# Patient Record
Sex: Female | Born: 1985 | Hispanic: Yes | Marital: Single | Smoking: Never smoker
Health system: Southern US, Community
[De-identification: ages and names within clinical notes are randomized; demographics above are authoritative.]

## PROBLEM LIST (undated history)

## (undated) ENCOUNTER — Emergency Department (HOSPITAL_COMMUNITY): Admission: EM | Payer: Self-pay | Source: Home / Self Care

## (undated) DIAGNOSIS — Z862 Personal history of diseases of the blood and blood-forming organs and certain disorders involving the immune mechanism: Secondary | ICD-10-CM

## (undated) DIAGNOSIS — R7401 Elevation of levels of liver transaminase levels: Secondary | ICD-10-CM

## (undated) DIAGNOSIS — R002 Palpitations: Secondary | ICD-10-CM

## (undated) DIAGNOSIS — E119 Type 2 diabetes mellitus without complications: Secondary | ICD-10-CM

## (undated) DIAGNOSIS — I10 Essential (primary) hypertension: Secondary | ICD-10-CM

## (undated) DIAGNOSIS — J45909 Unspecified asthma, uncomplicated: Secondary | ICD-10-CM

## (undated) DIAGNOSIS — F419 Anxiety disorder, unspecified: Secondary | ICD-10-CM

## (undated) DIAGNOSIS — L309 Dermatitis, unspecified: Secondary | ICD-10-CM

## (undated) HISTORY — DX: Personal history of diseases of the blood and blood-forming organs and certain disorders involving the immune mechanism: Z86.2

## (undated) HISTORY — DX: Essential (primary) hypertension: I10

## (undated) HISTORY — DX: Palpitations: R00.2

## (undated) HISTORY — DX: Elevation of levels of liver transaminase levels: R74.01

## (undated) HISTORY — PX: TUBAL LIGATION: SHX77

---

## 2016-04-17 ENCOUNTER — Encounter (HOSPITAL_COMMUNITY): Payer: Self-pay

## 2016-04-17 ENCOUNTER — Emergency Department (HOSPITAL_COMMUNITY)
Admission: EM | Admit: 2016-04-17 | Discharge: 2016-04-17 | Disposition: A | Discharge status: Home / Self Care | Payer: Self-pay | Attending: Emergency Medicine | Admitting: Emergency Medicine

## 2016-04-17 DIAGNOSIS — J45909 Unspecified asthma, uncomplicated: Secondary | ICD-10-CM | POA: Insufficient documentation

## 2016-04-17 DIAGNOSIS — K602 Anal fissure, unspecified: Secondary | ICD-10-CM | POA: Insufficient documentation

## 2016-04-17 HISTORY — DX: Unspecified asthma, uncomplicated: J45.909

## 2016-04-17 HISTORY — DX: Anxiety disorder, unspecified: F41.9

## 2016-04-17 HISTORY — DX: Dermatitis, unspecified: L30.9

## 2016-04-17 LAB — COMPREHENSIVE METABOLIC PANEL
ALBUMIN: 4.3 g/dL (ref 3.5–5.0)
ALT: 172 U/L — ABNORMAL HIGH (ref 14–54)
ANION GAP: 10 (ref 5–15)
AST: 142 U/L — ABNORMAL HIGH (ref 15–41)
Alkaline Phosphatase: 67 U/L (ref 38–126)
BUN: 14 mg/dL (ref 6–20)
CO2: 21 mmol/L — AB (ref 22–32)
Calcium: 9.7 mg/dL (ref 8.9–10.3)
Chloride: 105 mmol/L (ref 101–111)
Creatinine, Ser: 0.68 mg/dL (ref 0.44–1.00)
GFR calc Af Amer: >60 mL/min (ref >60)
GFR calc non Af Amer: >60 mL/min (ref >60)
GLUCOSE: 113 mg/dL — AB (ref 65–99)
POTASSIUM: 3.7 mmol/L (ref 3.5–5.1)
SODIUM: 136 mmol/L (ref 135–145)
Total Bilirubin: 1 mg/dL (ref 0.3–1.2)
Total Protein: 8 g/dL (ref 6.5–8.1)

## 2016-04-17 LAB — URINALYSIS, ROUTINE W REFLEX MICROSCOPIC
BILIRUBIN URINE: NEGATIVE
Glucose, UA: NEGATIVE mg/dL
Ketones, ur: NEGATIVE mg/dL
Leukocytes, UA: NEGATIVE
NITRITE: NEGATIVE
PROTEIN: NEGATIVE mg/dL
SPECIFIC GRAVITY, URINE: 1.018 (ref 1.005–1.030)
pH: 7 (ref 5.0–8.0)

## 2016-04-17 LAB — URINE MICROSCOPIC-ADD ON
BACTERIA UA: NONE SEEN
RBC / HPF: NONE SEEN RBC/hpf (ref 0–5)
WBC, UA: NONE SEEN WBC/hpf (ref 0–5)

## 2016-04-17 LAB — CBC
HEMATOCRIT: 40.6 % (ref 36.0–46.0)
HEMOGLOBIN: 14.3 g/dL (ref 12.0–15.0)
MCH: 36.8 pg — AB (ref 26.0–34.0)
MCHC: 35.2 g/dL (ref 30.0–36.0)
MCV: 104.4 fL — ABNORMAL HIGH (ref 78.0–100.0)
Platelets: 283 10*3/uL (ref 150–400)
RBC: 3.89 MIL/uL (ref 3.87–5.11)
RDW: 12.9 % (ref 11.5–15.5)
WBC: 8.8 10*3/uL (ref 4.0–10.5)

## 2016-04-17 LAB — TYPE AND SCREEN
ABO/RH(D): O POS
ANTIBODY SCREEN: NEGATIVE

## 2016-04-17 LAB — HCG, QUANTITATIVE, PREGNANCY

## 2016-04-17 LAB — ABO/RH: ABO/RH(D): O POS

## 2016-04-17 LAB — LIPASE, BLOOD: LIPASE: 22 U/L (ref 11–51)

## 2016-04-17 NOTE — ED Triage Notes (Signed)
PT C/O RECTAL BLEEDING X5 DAYS. PT STS SHE DID HAVE HARD STOOLS FOR THE FIRST FEW DAYS, BUT NO HX OF HEMORRHOIDS. PT ALSO STS NAUSEA, ABDOMINAL BLOATING, AND LOWER BACK PRESSURE. DENIES INJURY.

## 2016-04-17 NOTE — ED Provider Notes (Signed)
[facility]-EMERGENCY DEPT Provider Note   CSN: [id number] Arrival date & time: 04/17/16  1652     History   Chief Complaint Chief Complaint  Patient presents with  . Rectal Bleeding    HPI Sara Simpson is a 30 y.o. female.  She presents for evaluation of rectal bleeding which started a couple of days ago and is persistent. She notices blood in, comode, and when she wipes. She denies rectal pain but has some lower abdominal bloating. She was constipated about a week ago. No prior similar problem. She denies fever, chills, nausea, vomiting, weakness or dizziness. There are no other known modifying factors.  HPI  Past Medical History:  Diagnosis Date  . Anxiety   . Asthma   . Eczema     There are no active problems to display for this patient.   History reviewed. No pertinent surgical history.  OB History    No data available       Home Medications    Prior to Admission medications   Not on File    Family History History reviewed. No pertinent family history.  Social History Social History  Substance Use Topics  . Smoking status: Never Smoker  . Smokeless tobacco: Never Used  . Alcohol use Yes     Comment: TWICE A WEEK     Allergies   Review of patient's allergies indicates no known allergies.   Review of Systems Review of Systems  All other systems reviewed and are negative.    Physical Exam Updated Vital Signs BP 166/87 (BP Location: Right Arm)   Pulse 113   Temp 98.8 F (37.1 C) (Oral)   Resp 18   Ht 5\' 4"  (1.626 m)   Wt 234 lb (106.1 kg)   LMP 03/23/2016   SpO2 97%   BMI 40.17 kg/m   Physical Exam  Constitutional: She is oriented to person, place, and time. She appears well-developed and well-nourished. No distress.  HENT:  Head: Normocephalic and atraumatic.  Right Ear: External ear normal.  Left Ear: External ear normal.  Nose: Nose normal.  Eyes: Conjunctivae and EOM are normal. Pupils are equal, round, and reactive to light.    Neck: Normal range of motion and phonation normal. Neck supple.  Cardiovascular: Normal rate and regular rhythm.   Pulmonary/Chest: Effort normal and breath sounds normal. She exhibits no tenderness.  Abdominal: Soft. She exhibits no distension. There is no tenderness. There is no guarding.  Genitourinary:  Genitourinary Comments: Anus- mild redundant tissue consistent with prior hemorrhoids. Small posterior anal fissure, actively bleeding. Digital rectal examination was deferred.  Musculoskeletal: Normal range of motion.  Neurological: She is alert and oriented to person, place, and time. She exhibits normal muscle tone.  Skin: Skin is warm and dry.  Psychiatric: She has a normal mood and affect. Her behavior is normal. Judgment and thought content normal.  Nursing note and vitals reviewed.    ED Treatments / Results  Labs (all labs ordered are listed, but only abnormal results are displayed) Labs Reviewed  COMPREHENSIVE METABOLIC PANEL - Abnormal; Notable for the following:       Result Value   CO2 21 (*)    Glucose, Bld 113 (*)    AST 142 (*)    ALT 172 (*)    All other components within normal limits  CBC - Abnormal; Notable for the following:    MCV 104.4 (*)    MCH 36.8 (*)    All other components within normal limits  URINALYSIS, ROUTINE W REFLEX MICROSCOPIC (NOT AT [facility]) - Abnormal; Notable for the following:    Hgb urine dipstick TRACE (*)    All other components within normal limits  URINE MICROSCOPIC-ADD ON - Abnormal; Notable for the following:    Squamous Epithelial / LPF 6-30 (*)    All other components within normal limits  LIPASE, BLOOD  HCG, QUANTITATIVE, PREGNANCY  TYPE AND SCREEN  ABO/RH    EKG  EKG Interpretation None       Radiology No results found.  Procedures Procedures (including critical care time)  Medications Ordered in ED Medications - No data to display   Initial Impression / Assessment and Plan / ED Course  I have reviewed  the triage vital signs and the nursing notes.  Pertinent labs & imaging results that were available during my care of the patient were reviewed by me and considered in my medical decision making (see chart for details).  Clinical Course  Value Comment By Time  Glucose: (!) 113 Slightly high Mancel Bale, MD 10/24 1843  CO2: (!) 21 Slightly low Mancel Bale, MD 10/24 1843  Hgb urine dipstick: (!) TRACE Minimally elevated, clean catch urine Mancel Bale, MD 10/24 1843  Squamous Epithelial / LPF: (!) 6-30 Indicates contamination Mancel Bale, MD 10/24 1843  WBC: 8.8 Normal Mancel Bale, MD 10/24 1844  Hemoglobin: 14.3 Normal, reassuring in the face of rectal bleeding Mancel Bale, MD 10/24 1844  HCG, Beta Chain, Quant, S: <1 Not pregnant Mancel Bale, MD 10/24 1844  Pulse Rate: 113 High Mancel Bale, MD 10/24 1844  BP: 166/87 High Mancel Bale, MD 10/24 1844    Medications - No data to display  Patient Vitals for the past 24 hrs:  BP Temp Temp src Pulse Resp SpO2 Height Weight  04/17/16 1701 - - - - - - 5\' 4"  (1.626 m) 234 lb (106.1 kg)  04/17/16 1657 166/87 98.8 F (37.1 C) Oral 113 18 97 % - -    7:10 PM Reevaluation with update and discussion. After initial assessment and treatment, an updated evaluation reveals No change in clinical status. Findings discussed with patient and all questions were answered. Shanora Christensen L    Final Clinical Impressions(s) / ED Diagnoses   Final diagnoses:  Anal fissure   Rectal bleeding secondary to anal fissure. Doubt significant blood loss, or impending vascular collapse.  Nursing Notes Reviewed/ Care Coordinated Applicable Imaging Reviewed Interpretation of Laboratory Data incorporated into ED treatment  The patient appears reasonably screened and/or stabilized for discharge and I doubt any other medical condition or other [facility] requiring further screening, evaluation, or treatment in the ED at this time prior to discharge.  Plan:  Home Medications- continue usual, Colace prn; Home Treatments- warm soaks, clean with moist wipe, avoid constipation; return here if the recommended treatment, does not improve the symptoms; Recommended follow up- PCP prn   New Prescriptions New Prescriptions   No medications on file     Mancel Bale, MD 04/17/16 [phone]

## 2016-04-17 NOTE — Discharge Instructions (Signed)
The anal fissure is unlikely to cause any long-term problems.  To encourage healing, soak in a tub once or twice a day. After bowel movements, clean well with a product such as a diaper wipe, to keep the area clean. You can try using a moisturizing cream on the area as well. If your stool tending to be hard, increase the fiber in your diet, avoid constipating foods, or try Colace 100 mg twice a day.  See the doctor of your choice as needed for problems.

## 2016-07-29 ENCOUNTER — Encounter (HOSPITAL_COMMUNITY): Payer: Self-pay | Admitting: *Deleted

## 2016-07-29 ENCOUNTER — Emergency Department (HOSPITAL_COMMUNITY)
Admission: EM | Admit: 2016-07-29 | Discharge: 2016-07-29 | Disposition: A | Discharge status: Home / Self Care | Payer: Self-pay | Attending: Emergency Medicine | Admitting: Emergency Medicine

## 2016-07-29 DIAGNOSIS — J45909 Unspecified asthma, uncomplicated: Secondary | ICD-10-CM | POA: Insufficient documentation

## 2016-07-29 DIAGNOSIS — L03818 Cellulitis of other sites: Secondary | ICD-10-CM

## 2016-07-29 DIAGNOSIS — H6012 Cellulitis of left external ear: Secondary | ICD-10-CM | POA: Insufficient documentation

## 2016-07-29 MED ORDER — DOXYCYCLINE HYCLATE 100 MG PO TABS
100.0000 mg | ORAL_TABLET | Freq: Once | ORAL | Status: AC
  Administered 2016-07-29: 100 mg via ORAL
  Filled 2016-07-29: qty 1

## 2016-07-29 MED ORDER — DOXYCYCLINE HYCLATE 100 MG PO CAPS: 100.0000 mg | ORAL_CAPSULE | Freq: Two times a day (BID) | ORAL | 0 refills | Status: AC

## 2016-07-29 NOTE — Discharge Instructions (Signed)
Please take doxycycline twice a day for 7 days. Use warm compress to the area several times a day. Keep area clean and dry. Make sure to frequently wash her hands throughout the day. Do not use guages until symptoms have been resolved.   Get help right away if: Your symptoms get worse. You feel very sleepy. You develop vomiting or diarrhea that persists. You notice red streaks coming from the infected area. Your red area gets larger or turns dark in color.

## 2016-07-29 NOTE — ED Provider Notes (Signed)
[facility]-EMERGENCY DEPT Provider Note   CSN: [id number] Arrival date & time: 07/29/16  1813  By signing my name below, I, Clovis PuAvnee Patel, attest that this documentation has been prepared under the direction and in the presence of  Francisco Espina- PA-C. Electronically Signed: Clovis PuAvnee Patel, ED Scribe. 07/29/16. 6:43 PM.  History   Chief Complaint Chief Complaint  Patient presents with  . Facial Swelling   The history is provided by the patient. No language interpreter was used.   HPI Comments:  Sara Simpson is a 31 y.o. female who presents to the Emergency Department complaining of worsening left ear swelling with surrounding associated pain onset yesterday. She describes her pain as a "8/10 and sharp" to the touch and a "5/10 and dull" at rest. Her pain is worse upon palpation. Pt states she wears guages x 1 year and takes them out daily. Pt also notes she is in contact with people at work and also goes to a gym where she may have been exposed to staph. She has taken ibuprofen and Claritin with no relief. Pt denies any fevers, chills, nausea, vomiting, diarrhea, abdominal pain, chest pain, SOB or recent cold-like symptoms. No hx of MRSA. No other complaints noted. No PCP. Last menstrual period was about 1 month ago and pt denies a chance of being pregnant.   Past Medical History:  Diagnosis Date  . Anxiety   . Asthma   . Eczema     There are no active problems to display for this patient.   History reviewed. No pertinent surgical history.  OB History    No data available       Home Medications    Prior to Admission medications   Medication Sig Start Date End Date Taking? Authorizing Provider  doxycycline (VIBRAMYCIN) 100 MG capsule Take 1 capsule (100 mg total) by mouth 2 (two) times daily. 07/29/16 08/05/16  Alvina ChouFrancisco Manuel Espina, [place]    Family History No family history on file.  Social History Social History  Substance Use Topics  . Smoking status: Never Smoker  .  Smokeless tobacco: Never Used  . Alcohol use Yes     Comment: TWICE A WEEK     Allergies   Patient has no known allergies.   Review of Systems Review of Systems  Constitutional: Negative for chills and fever.  HENT: Positive for ear pain and facial swelling. Negative for congestion.   Respiratory: Negative for shortness of breath.   Cardiovascular: Negative for chest pain.  Gastrointestinal: Negative for abdominal pain, diarrhea, nausea and vomiting.     Physical Exam Updated Vital Signs BP 143/85   Pulse 107   Temp 97.9 F (36.6 C) (Oral)   Resp 16   SpO2 94%   Physical Exam  Constitutional: She is oriented to person, place, and time. She appears well-developed and well-nourished. No distress.  Well appearing.   HENT:  Head: Normocephalic and atraumatic.  Left Ear: There is swelling.  Redness, warmth, swelling, and TTP to left ear and pinna. Patient has possible source of infection at center of pinna where her guages usually are placed. Scant blood and discharge noted.   Eyes: EOM are normal. Right eye exhibits no discharge. Left eye exhibits no discharge.  Neck: Normal range of motion.  Pulmonary/Chest: Effort normal. No respiratory distress.  Neurological: She is alert and oriented to person, place, and time. Coordination normal.  Skin: No rash noted. She is not diaphoretic.  Psychiatric: She has a normal mood and affect. Her  behavior is normal.  Nursing note and vitals reviewed.    ED Treatments / Results  DIAGNOSTIC STUDIES:  Oxygen Saturation is 94% on RA, normal by my interpretation.    COORDINATION OF CARE:  6:40 PM Discussed treatment plan with pt at bedside and pt agreed to plan.  Labs (all labs ordered are listed, but only abnormal results are displayed) Labs Reviewed - No data to display  EKG  EKG Interpretation None       Radiology No results found.  Procedures Procedures (including critical care time)  Medications Ordered in  ED Medications  doxycycline (VIBRA-TABS) tablet 100 mg (100 mg Oral Given 07/29/16 1904)     Initial Impression / Assessment and Plan / ED Course  I have reviewed the triage vital signs and the nursing notes.  Pertinent labs & imaging results that were available during my care of the patient were reviewed by me and considered in my medical decision making (see chart for details).    Pt is without gross abscess for which I&D would be possible.  Pt encouraged to return if redness begins to streak, extends beyond where it currently is, and/or fever or nausea/vomiting develop.  Pt is alert, oriented, NAD, afebrile, mildly tachycardic but, nonseptic and nontoxic appearing.  Pt to be d/c on oral antibiotics with strict f/u instructions to follow up with a PCP in 2-5 days. Return precautions given.    Final Clinical Impressions(s) / ED Diagnoses   Final diagnoses:  Cellulitis of other specified site    New Prescriptions Discharge Medication List as of 07/29/2016  6:49 PM    START taking these medications   Details  doxycycline (VIBRAMYCIN) 100 MG capsule Take 1 capsule (100 mg total) by mouth 2 (two) times daily., Starting Sun 07/29/2016, Until Sun 08/05/2016, Print      I personally performed the services described in this documentation, which was scribed in my presence. The recorded information has been reviewed and is accurate.    [address] [place], [place] 07/29/16 2001    Maia Plan, MD 07/30/16 1130

## 2016-07-29 NOTE — ED Triage Notes (Signed)
Pt states yesterday she felt some pain in her ear so she took the gauges out of her ear. This morning woke up with left ear swelling and warmth. No fevers.

## 2016-07-30 ENCOUNTER — Emergency Department (HOSPITAL_BASED_OUTPATIENT_CLINIC_OR_DEPARTMENT_OTHER)
Admission: EM | Admit: 2016-07-30 | Discharge: 2016-07-30 | Disposition: A | Discharge status: Home / Self Care | Payer: Self-pay | Attending: Dermatology | Admitting: Dermatology

## 2016-07-30 ENCOUNTER — Encounter (HOSPITAL_BASED_OUTPATIENT_CLINIC_OR_DEPARTMENT_OTHER): Payer: Self-pay | Admitting: *Deleted

## 2016-07-30 DIAGNOSIS — J45909 Unspecified asthma, uncomplicated: Secondary | ICD-10-CM | POA: Insufficient documentation

## 2016-07-30 DIAGNOSIS — Z79899 Other long term (current) drug therapy: Secondary | ICD-10-CM | POA: Insufficient documentation

## 2016-07-30 DIAGNOSIS — H9202 Otalgia, left ear: Secondary | ICD-10-CM | POA: Insufficient documentation

## 2016-07-30 DIAGNOSIS — Z5321 Procedure and treatment not carried out due to patient leaving prior to being seen by health care provider: Secondary | ICD-10-CM | POA: Insufficient documentation

## 2016-07-30 NOTE — ED Notes (Signed)
Called for reevaluation, no response

## 2016-07-30 NOTE — ED Notes (Signed)
Pt called x 3 , not found in lobby or bistro

## 2016-07-30 NOTE — ED Triage Notes (Signed)
Pt c/o left ear pain and swelling ? Abscess , [facility] , taking ABX

## 2018-02-27 ENCOUNTER — Other Ambulatory Visit: Payer: Self-pay

## 2019-01-23 ENCOUNTER — Emergency Department (HOSPITAL_COMMUNITY)
Admission: EM | Admit: 2019-01-23 | Discharge: 2019-01-23 | Disposition: A | Discharge status: Home / Self Care | Payer: Self-pay | Attending: Emergency Medicine | Admitting: Emergency Medicine

## 2019-01-23 ENCOUNTER — Other Ambulatory Visit: Payer: Self-pay

## 2019-01-23 ENCOUNTER — Encounter (HOSPITAL_COMMUNITY): Payer: Self-pay | Admitting: Emergency Medicine

## 2019-01-23 DIAGNOSIS — R000 Tachycardia, unspecified: Secondary | ICD-10-CM | POA: Insufficient documentation

## 2019-01-23 DIAGNOSIS — Z5321 Procedure and treatment not carried out due to patient leaving prior to being seen by health care provider: Secondary | ICD-10-CM | POA: Insufficient documentation

## 2019-01-23 NOTE — ED Notes (Signed)
Updated patient on wait for treatment room, as department is full and no available beds at this time.

## 2019-01-23 NOTE — ED Triage Notes (Signed)
Pt states that around 2am drank a few seltzers and having her heart racing. Reports when sitting still around 105-107- then when walking around it goes up to 110s. Reports she can't sleep. Denies pains.

## 2019-01-23 NOTE — ED Notes (Signed)
Pt states that her HR came down (97 on heart monitor in triage room) and she wants to go home and will follow up her with PCP.

## 2019-01-26 ENCOUNTER — Ambulatory Visit (INDEPENDENT_AMBULATORY_CARE_PROVIDER_SITE_OTHER): Payer: Self-pay | Admitting: Physician Assistant

## 2019-01-26 ENCOUNTER — Other Ambulatory Visit: Payer: Self-pay

## 2019-01-26 ENCOUNTER — Telehealth: Payer: Self-pay | Admitting: Physician Assistant

## 2019-01-26 ENCOUNTER — Encounter: Payer: Self-pay | Admitting: Physician Assistant

## 2019-01-26 VITALS — BP 133/81 | HR 86 | Temp 98.1°F | Ht 65.0 in | Wt 261.0 lb

## 2019-01-26 DIAGNOSIS — R002 Palpitations: Secondary | ICD-10-CM

## 2019-01-26 DIAGNOSIS — Z8249 Family history of ischemic heart disease and other diseases of the circulatory system: Secondary | ICD-10-CM

## 2019-01-26 DIAGNOSIS — R740 Nonspecific elevation of levels of transaminase and lactic acid dehydrogenase [LDH]: Secondary | ICD-10-CM

## 2019-01-26 DIAGNOSIS — R7401 Elevation of levels of liver transaminase levels: Secondary | ICD-10-CM

## 2019-01-26 DIAGNOSIS — I493 Ventricular premature depolarization: Secondary | ICD-10-CM

## 2019-01-26 DIAGNOSIS — Z1389 Encounter for screening for other disorder: Secondary | ICD-10-CM

## 2019-01-26 DIAGNOSIS — Z13220 Encounter for screening for lipoid disorders: Secondary | ICD-10-CM

## 2019-01-26 DIAGNOSIS — Z8349 Family history of other endocrine, nutritional and metabolic diseases: Secondary | ICD-10-CM

## 2019-01-26 DIAGNOSIS — I10 Essential (primary) hypertension: Secondary | ICD-10-CM

## 2019-01-26 DIAGNOSIS — R7303 Prediabetes: Secondary | ICD-10-CM

## 2019-01-26 LAB — POCT GLYCOSYLATED HEMOGLOBIN (HGB A1C): HbA1c, POC (prediabetic range): 6 % (ref 5.7–6.4)

## 2019-01-26 NOTE — Progress Notes (Signed)
HPI:                                                                Sara Simpson is a 33 y.o. female who presents to [place]: [facility] today to establish care  Current concerns: screen for diabetes, borderline high blood pressure and tachycardia  Patient reports recurrent episodes of "tachycardia" described as "heart racing." She states she wears an Apple watch and resting HR is typically in the 70's-80's. On Friday night after drinking several malt beverages she noticed that her resting heart rate was elevated at 108-112 and with walking HR would increase to 120-140. Associated with feeling lightheaded and "loopy." No associated chest discomfort, dyspnea, nausea, diaphoresis, or syncope. States she went to the ER for the symptoms, but left without being seen due to long wait time. Symptoms resolved spontaneously on Saturday. Episodes occur infrequently every several months ongoing for years. She is wondering if they could be due to anxiety. Reports that she took Sertraline in the past for anxiety, but is not currently taking any medications.  She also reports she has been told her blood pressure is borderline high.  Endorses drinking multiple caffeinated beverages daily, but she is trying to cut back. Denies illicit drug use. Drinks socially approx 4 drinks per week. Family hx significant for thyroid disease in sister, HTN and heart attack in MGF.  She also reports a strong family hx of diabetes (mother, maternal aunts, maternal grandparents) and would like to be screened. Denies polydipsia, polyuria, polyphagia.   Depression screen [facility] 2/9 01/26/2019  Decreased Interest 0  Down, Depressed, Hopeless 0  PHQ - 2 Score 0  Altered sleeping 2  Tired, decreased energy 2  Change in appetite 0  Feeling bad or failure about yourself  0  Trouble concentrating 0  Moving slowly or fidgety/restless 0  Suicidal thoughts 0  PHQ-9 Score 4    GAD 7 :  Generalized Anxiety Score 01/26/2019  Nervous, Anxious, on Edge 1  Control/stop worrying 1  Worry too much - different things 1  Trouble relaxing 1  Restless 0  Easily annoyed or irritable 1  Afraid - awful might happen 0  Total GAD 7 Score 5      Past Medical History:  Diagnosis Date  . Anxiety   . Asthma   . Eczema    No past surgical history on file. Social History   Tobacco Use  . Smoking status: Never Smoker  . Smokeless tobacco: Never Used  Substance Use Topics  . Alcohol use: Yes    Comment: TWICE A WEEK   family history is not on file.    ROS: negative except as noted in the HPI  Medications: No current outpatient medications on file.   No current facility-administered medications for this visit.    No Known Allergies     Objective:  BP 133/81   Pulse 86   Temp 98.1 F (36.7 C) (Oral)   Ht 5\' 5"  (1.651 m)   Wt 261 lb (118.4 kg)   LMP 01/26/2019 (Exact Date)   BMI 43.43 kg/m   Wt Readings from Last 3 Encounters:  01/26/19 261 lb (118.4 kg)  07/30/16 236 lb (107 kg)  04/17/16 234 lb (106.1 kg)  Temp Readings from Last 3 Encounters:  01/26/19 98.1 F (36.7 C) (Oral)  07/30/16 99.1 F (37.3 C) (Oral)  07/29/16 97.9 F (36.6 C) (Oral)   BP Readings from Last 3 Encounters:  01/26/19 133/81  07/30/16 125/88  07/29/16 143/85   Pulse Readings from Last 3 Encounters:  01/26/19 86  07/30/16 101  07/29/16 107    Gen:  alert, not ill-appearing, no distress, appropriate for age, obese female HEENT: head normocephalic without obvious abnormality, conjunctiva and cornea clear, trachea midline Pulm: Normal work of breathing, normal phonation, clear to auscultation bilaterally, no wheezes, rales or rhonchi CV: Normal rate, regular rhythm, s1 and s2 distinct, no murmurs, clicks or rubs  Neuro: alert and oriented x 3, no tremor MSK: extremities atraumatic, normal gait and station, no peripheral edema Skin: intact, no rashes on exposed skin, no  jaundice, no cyanosis Psych: well-groomed, cooperative, good eye contact, euthymic mood, affect mood-congruent, speech is articulate, and thought processes clear and goal-directed  ECG 01/26/19 16:03 Vent rate 83 bpm PR-I 136 ms QRS 86 ms QT/QTc 412/484 ms Sinus rhythm with premature supraventricular complexes and premature ventricular complexes or fusion complexes Abnormal ECG  Assessment and Plan: 33 y.o. female with  .Sara Simpson was seen today for establish care.  Diagnoses and all orders for this visit:  Intermittent palpitations -     CBC -     COMPLETE METABOLIC PANEL WITH GFR -     TSH + free T4 -     Lipid Panel w/reflex Direct LDL -     EKG 12-Lead  VPC's (ventricular premature complexes)  Hypertension goal BP (blood pressure) < 130/80 -     COMPLETE METABOLIC PANEL WITH GFR -     TSH + free T4 -     Urinalysis, Routine w reflex microscopic  Prediabetes -     Amb ref to Medical Nutrition Therapy-MNT -     POCT HgB A1C  Transaminitis -     Hepatic function panel -     Hepatitis panel, acute -     US Abdomen Limited RUQ; Future -     AntiMicrosomal Ab-Liver / Kidney -     Anti-smooth muscle antibody, IgG -     ANA  Screening for lipid disorders -     Lipid Panel w/reflex Direct LDL  Family history of thyroid disease -     TSH + free T4  Family history of heart disease -     Lipid Panel w/reflex Direct LDL  Screening for blood or protein in urine -     Urinalysis, Routine w reflex microscopic   - Personally reviewed PMH, PSH, PFH, medications, allergies, HM - Age-appropriate cancer screening: overdue for Pap smear, will schedule - Tdap declined - PHQ2 negative  Palpitations ECG personally reviewed by me and Dr. Clementeen GrahamEvan Corey. Normal QT. Normal axis. 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 Romone Shaff PA-C

## 2019-01-26 NOTE — Patient Instructions (Addendum)
For your blood pressure: - Goal <130/80 (Ideally 120's/70's) - monitor and log blood pressure and heart rate at home - check around the same time each day in a relaxed setting - reduce caffeine consumption and try to switch to decaf - Limit salt to <2500 mg/day - Follow DASH (Dietary Approach to Stopping Hypertension) eating plan - Try to get at least 150 minutes of aerobic exercise per week - Aim to go on a brisk walk 30 minutes per day at least 5 days per week. If you're not active, gradually increase how long you walk by 5 minutes each week - limit alcohol: 2 standard drinks per day for men and 1 per day for women - avoid tobacco/nicotine products. Consider smoking cessation if you smoke - weight loss: 7% of current body weight can reduce your blood pressure by 5-10 points  Palpitations Palpitations are feelings that your heartbeat is irregular or is faster than normal. It may feel like your heart is fluttering or skipping a beat. Palpitations are usually not a serious problem. They may be caused by many things, including smoking, caffeine, alcohol, stress, and certain medicines or drugs. Most causes of palpitations are not serious. However, some palpitations can be a sign of a serious problem. You may need further tests to rule out serious medical problems. Follow these instructions at home:     Pay attention to any changes in your condition. Take these actions to help manage your symptoms: Eating and drinking  Avoid foods and drinks that may cause palpitations. These may include: ? Caffeinated coffee, tea, soft drinks, diet pills, and energy drinks. ? Chocolate. ? Alcohol. Lifestyle  Take steps to reduce your stress and anxiety. Things that can help you relax include: ? Yoga. ? Mind-body activities, such as deep breathing, meditation, or using words and images to create positive thoughts (guided imagery). ? Physical activity, such as swimming, jogging, or walking. Tell your health  care provider if your palpitations increase with activity. If you have chest pain or shortness of breath with activity, do not continue the activity until you are seen by your health care provider. ? Biofeedback. This is a method that helps you learn to use your mind to control things in your body, such as your heartbeat.  Do not use drugs, including cocaine or ecstasy. Do not use marijuana.  Get plenty of rest and sleep. Keep a regular bed time. General instructions  Take over-the-counter and prescription medicines only as told by your health care provider.  Do not use any products that contain nicotine or tobacco, such as cigarettes and e-cigarettes. If you need help quitting, ask your health care provider.  Keep all follow-up visits as told by your health care provider. This is important. These may include visits for further testing if palpitations do not go away or get worse. Contact a health care provider if you:  Continue to have a fast or irregular heartbeat after 24 hours.  Notice that your palpitations occur more often. Get help right away if you:  Have chest pain or shortness of breath.  Have a severe headache.  Feel dizzy or you faint. Summary  Palpitations are feelings that your heartbeat is irregular or is faster than normal. It may feel like your heart is fluttering or skipping a beat.  Palpitations may be caused by many things, including smoking, caffeine, alcohol, stress, certain medicines, and drugs.  Although most causes of palpitations are not serious, some causes can be a sign of a  serious medical problem.  Get help right away if you faint or have chest pain, shortness of breath, a severe headache, or dizziness. This information is not intended to replace advice given to you by your health care provider. Make sure you discuss any questions you have with your health care provider. Document Released: 06/08/2000 Document Revised: 07/24/2017 Document Reviewed:  07/24/2017 Elsevier Patient Education  2020 Elsevier Inc.   Prediabetes Eating Plan Prediabetes is a condition that causes blood sugar (glucose) levels to be higher than normal. This increases the risk for developing diabetes. In order to prevent diabetes from developing, your health care provider may recommend a diet and other lifestyle changes to help you:  Control your blood glucose levels.  Improve your cholesterol levels.  Manage your blood pressure. Your health care provider may recommend working with a diet and nutrition specialist (dietitian) to make a meal plan that is best for you. What are tips for following this plan? Lifestyle  Set weight loss goals with the help of your health care team. It is recommended that most people with prediabetes lose 7% of their current body weight.  Exercise for at least 30 minutes at least 5 days a week.  Attend a support group or seek ongoing support from a mental health counselor.  Take over-the-counter and prescription medicines only as told by your health care provider. Reading food labels  Read food labels to check the amount of fat, salt (sodium), and sugar in prepackaged foods. Avoid foods that have: ? Saturated fats. ? Trans fats. ? Added sugars.  Avoid foods that have more than 300 milligrams (mg) of sodium per serving. Limit your daily sodium intake to less than 2,300 mg each day. Shopping  Avoid buying pre-made and processed foods. Cooking  Cook with olive oil. Do not use butter, lard, or ghee.  Bake, broil, grill, or boil foods. Avoid frying. Meal planning   Work with your dietitian to develop an eating plan that is right for you. This may include: ? Tracking how many calories you take in. Use a food diary, notebook, or mobile application to track what you eat at each meal. ? Using the glycemic index (GI) to plan your meals. The index tells you how quickly a food will raise your blood glucose. Choose low-GI foods.  These foods take a longer time to raise blood glucose.  Consider following a Mediterranean diet. This diet includes: ? Several servings each day of fresh fruits and vegetables. ? Eating fish at least twice a week. ? Several servings each day of whole grains, beans, nuts, and seeds. ? Using olive oil instead of other fats. ? Moderate alcohol consumption. ? Eating small amounts of red meat and whole-fat dairy.  If you have high blood pressure, you may need to limit your sodium intake or follow a diet such as the DASH eating plan. DASH is an eating plan that aims to lower high blood pressure. What foods are recommended? The items listed below may not be a complete list. Talk with your dietitian about what dietary choices are best for you. Grains Whole grains, such as whole-wheat or whole-grain breads, crackers, cereals, and pasta. Unsweetened oatmeal. Bulgur. Barley. Quinoa. Brown rice. Corn or whole-wheat flour tortillas or taco shells. Vegetables Lettuce. Spinach. Peas. Beets. Cauliflower. Cabbage. Broccoli. Carrots. Tomatoes. Squash. Eggplant. Herbs. Peppers. Onions. Cucumbers. Brussels sprouts. Fruits Berries. Bananas. Apples. Oranges. Grapes. Papaya. Mango. Pomegranate. Kiwi. Grapefruit. Cherries. Meats and other protein foods Seafood. Poultry without skin. Lean cuts of  pork and beef. Tofu. Eggs. Nuts. Beans. Dairy Low-fat or fat-free dairy products, such as yogurt, cottage cheese, and cheese. Beverages Water. Tea. Coffee. Sugar-free or diet soda. Seltzer water. Lowfat or no-fat milk. Milk alternatives, such as soy or almond milk. Fats and oils Olive oil. Canola oil. Sunflower oil. Grapeseed oil. Avocado. Walnuts. Sweets and desserts Sugar-free or low-fat pudding. Sugar-free or low-fat ice cream and other frozen treats. Seasoning and other foods Herbs. Sodium-free spices. Mustard. Relish. Low-fat, low-sugar ketchup. Low-fat, low-sugar barbecue sauce. Low-fat or fat-free mayonnaise.  What foods are not recommended? The items listed below may not be a complete list. Talk with your dietitian about what dietary choices are best for you. Grains Refined white flour and flour products, such as bread, pasta, snack foods, and cereals. Vegetables Canned vegetables. Frozen vegetables with butter or cream sauce. Fruits Fruits canned with syrup. Meats and other protein foods Fatty cuts of meat. Poultry with skin. Breaded or fried meat. Processed meats. Dairy Full-fat yogurt, cheese, or milk. Beverages Sweetened drinks, such as sweet iced tea and soda. Fats and oils Butter. Lard. Ghee. Sweets and desserts Baked goods, such as cake, cupcakes, pastries, cookies, and cheesecake. Seasoning and other foods Spice mixes with added salt. Ketchup. Barbecue sauce. Mayonnaise. Summary  To prevent diabetes from developing, you may need to make diet and other lifestyle changes to help control blood sugar, improve cholesterol levels, and manage your blood pressure.  Set weight loss goals with the help of your health care team. It is recommended that most people with prediabetes lose 7 percent of their current body weight.  Consider following a Mediterranean diet that includes plenty of fresh fruits and vegetables, whole grains, beans, nuts, seeds, fish, lean meat, low-fat dairy, and healthy oils. This information is not intended to replace advice given to you by your health care provider. Make sure you discuss any questions you have with your health care provider. Document Released: 10/26/2014 Document Revised: 10/03/2018 Document Reviewed: 08/15/2016 Elsevier Patient Education  2020 [employer].

## 2019-01-26 NOTE — Telephone Encounter (Signed)
We are not doing a heart monitor because her symptoms do not occur frequently enough to capture it on a monitor We can discuss further at her 1 week follow-up

## 2019-01-26 NOTE — Telephone Encounter (Signed)
Pt called back today after her appt and was wondering if someone would contact her about her heart monitor that charley had mentioned her wearing a holter monitor but she wasn't sure. I do not see the order

## 2019-01-27 LAB — COMPLETE METABOLIC PANEL WITH GFR
AG Ratio: 1.2 (calc) (ref 1.0–2.5)
ALT: 139 U/L — ABNORMAL HIGH (ref 6–29)
AST: 86 U/L — ABNORMAL HIGH (ref 10–30)
Albumin: 4.3 g/dL (ref 3.6–5.1)
Alkaline phosphatase (APISO): 72 U/L (ref 31–125)
BUN: 11 mg/dL (ref 7–25)
CO2: 26 mmol/L (ref 20–32)
Calcium: 9.8 mg/dL (ref 8.6–10.2)
Chloride: 103 mmol/L (ref 98–110)
Creat: 0.61 mg/dL (ref 0.50–1.10)
GFR, Est African American: 137 mL/min/{1.73_m2} (ref >=60)
GFR, Est Non African American: 118 mL/min/{1.73_m2} (ref >=60)
Globulin: 3.6 g/dL (calc) (ref 1.9–3.7)
Glucose, Bld: 99 mg/dL (ref 65–99)
Potassium: 4.1 mmol/L (ref 3.5–5.3)
Sodium: 140 mmol/L (ref 135–146)
Total Bilirubin: 0.6 mg/dL (ref 0.2–1.2)
Total Protein: 7.9 g/dL (ref 6.1–8.1)

## 2019-01-27 LAB — LIPID PANEL W/REFLEX DIRECT LDL
Cholesterol: 204 mg/dL — ABNORMAL HIGH (ref <200)
HDL: 65 mg/dL (ref >=50)
LDL Cholesterol (Calc): 120 mg/dL (calc) — ABNORMAL HIGH
Non-HDL Cholesterol (Calc): 139 mg/dL (calc) — ABNORMAL HIGH (ref <130)
Total CHOL/HDL Ratio: 3.1 (calc) (ref <5.0)
Triglycerides: 87 mg/dL (ref <150)

## 2019-01-27 LAB — CBC
HCT: 40.4 % (ref 35.0–45.0)
Hemoglobin: 14.1 g/dL (ref 11.7–15.5)
MCH: 35.4 pg — ABNORMAL HIGH (ref 27.0–33.0)
MCHC: 34.9 g/dL (ref 32.0–36.0)
MCV: 101.5 fL — ABNORMAL HIGH (ref 80.0–100.0)
MPV: 9.1 fL (ref 7.5–12.5)
Platelets: 342 10*3/uL (ref 140–400)
RBC: 3.98 10*6/uL (ref 3.80–5.10)
RDW: 12.1 % (ref 11.0–15.0)
WBC: 9.5 10*3/uL (ref 3.8–10.8)

## 2019-01-27 LAB — URINALYSIS, ROUTINE W REFLEX MICROSCOPIC
Bilirubin Urine: NEGATIVE
Glucose, UA: NEGATIVE
Hgb urine dipstick: NEGATIVE
Ketones, ur: NEGATIVE
Leukocytes,Ua: NEGATIVE
Nitrite: NEGATIVE
Protein, ur: NEGATIVE
Specific Gravity, Urine: 1.023 (ref 1.001–1.03)
pH: <=5 (ref 5.0–8.0)

## 2019-01-27 LAB — TSH+FREE T4: TSH W/REFLEX TO FT4: 1.83 mIU/L

## 2019-01-27 NOTE — Telephone Encounter (Signed)
Patient advised.

## 2019-01-28 ENCOUNTER — Encounter: Payer: Self-pay | Admitting: Physician Assistant

## 2019-01-28 DIAGNOSIS — R7401 Elevation of levels of liver transaminase levels: Secondary | ICD-10-CM | POA: Insufficient documentation

## 2019-01-29 ENCOUNTER — Other Ambulatory Visit: Payer: Self-pay

## 2019-01-29 ENCOUNTER — Ambulatory Visit (HOSPITAL_BASED_OUTPATIENT_CLINIC_OR_DEPARTMENT_OTHER)
Admission: RE | Admit: 2019-01-29 | Discharge: 2019-01-29 | Disposition: A | Discharge status: Home / Self Care | Payer: Self-pay | Source: Ambulatory Visit | Attending: Physician Assistant | Admitting: Physician Assistant

## 2019-01-29 ENCOUNTER — Other Ambulatory Visit (HOSPITAL_BASED_OUTPATIENT_CLINIC_OR_DEPARTMENT_OTHER): Payer: Self-pay

## 2019-01-29 DIAGNOSIS — R7401 Elevation of levels of liver transaminase levels: Secondary | ICD-10-CM

## 2019-01-29 DIAGNOSIS — R740 Nonspecific elevation of levels of transaminase and lactic acid dehydrogenase [LDH]: Secondary | ICD-10-CM | POA: Insufficient documentation

## 2019-01-30 ENCOUNTER — Telehealth: Payer: Self-pay

## 2019-01-30 ENCOUNTER — Ambulatory Visit (HOSPITAL_COMMUNITY): Payer: Self-pay

## 2019-01-30 ENCOUNTER — Encounter: Payer: Self-pay | Admitting: Physician Assistant

## 2019-01-30 DIAGNOSIS — K760 Fatty (change of) liver, not elsewhere classified: Secondary | ICD-10-CM | POA: Insufficient documentation

## 2019-01-30 NOTE — Telephone Encounter (Signed)
Verdis Frederickson called requesting results. Please advise.

## 2019-02-01 ENCOUNTER — Encounter: Payer: Self-pay | Admitting: Physician Assistant

## 2019-02-01 DIAGNOSIS — I493 Ventricular premature depolarization: Secondary | ICD-10-CM | POA: Insufficient documentation

## 2019-02-01 DIAGNOSIS — I10 Essential (primary) hypertension: Secondary | ICD-10-CM | POA: Insufficient documentation

## 2019-02-01 DIAGNOSIS — R7303 Prediabetes: Secondary | ICD-10-CM | POA: Insufficient documentation

## 2019-02-02 ENCOUNTER — Encounter: Payer: Self-pay | Admitting: Physician Assistant

## 2019-02-09 ENCOUNTER — Encounter: Payer: Self-pay | Admitting: Physician Assistant

## 2019-02-09 ENCOUNTER — Telehealth (INDEPENDENT_AMBULATORY_CARE_PROVIDER_SITE_OTHER): Payer: Self-pay | Admitting: Physician Assistant

## 2019-02-09 VITALS — BP 97/63 | HR 63

## 2019-02-09 DIAGNOSIS — I10 Essential (primary) hypertension: Secondary | ICD-10-CM

## 2019-02-09 DIAGNOSIS — K700 Alcoholic fatty liver: Secondary | ICD-10-CM

## 2019-02-09 DIAGNOSIS — R740 Nonspecific elevation of levels of transaminase and lactic acid dehydrogenase [LDH]: Secondary | ICD-10-CM

## 2019-02-09 DIAGNOSIS — R7401 Elevation of levels of liver transaminase levels: Secondary | ICD-10-CM

## 2019-02-09 NOTE — Progress Notes (Signed)
Virtual Visit via Video Note  I connected with Sara Simpson on 03/03/19 at  3:00 PM EDT by a video enabled telemedicine application and verified that I am speaking with the correct person using two identifiers.   I discussed the limitations of evaluation and management by telemedicine and the availability of in person appointments. The patient expressed understanding and agreed to proceed.  History of Present Illness: HPI:                                                                Sara JourdainMaria Simpson is a 33 y.o. female   CC: follow-up palpiations, HTN, transaminits  Patient was found to have moderate transaminits on recent labs. RUQ US on 01/29/19 showed fatty infiltration Reports she has a recent history of binge drinking 3 bottles of wine or a fifth of liquor every 3-4 days. Reports she was drinking 6 pack daily x 6-7 years, but has not done this in the last year.  She has not had any alcohol for the last 15 days.  She denies nausea, abdominal pain, pruritis.   Reports she has been monitoring BP at home and has cut out salt in addition to alcohol. She states she has not had any additional episodes of palpiations SBP 120's-140's / 63-79 HR 93 - 98   Depression screen PHQ 2/9 01/26/2019  Decreased Interest 0  Down, Depressed, Hopeless 0  PHQ - 2 Score 0  Altered sleeping 2  Tired, decreased energy 2  Change in appetite 0  Feeling bad or failure about yourself  0  Trouble concentrating 0  Moving slowly or fidgety/restless 0  Suicidal thoughts 0  PHQ-9 Score 4    GAD 7 : Generalized Anxiety Score 01/26/2019  Nervous, Anxious, on Edge 1  Control/stop worrying 1  Worry too much - different things 1  Trouble relaxing 1  Restless 0  Easily annoyed or irritable 1  Afraid - awful might happen 0  Total GAD 7 Score 5      Past Medical History:  Diagnosis Date  . Anxiety   . Asthma   . Eczema   . History of anemia   . Hypertension   . Palpitations   . Transaminitis     Past Surgical History:  Procedure Laterality Date  . TUBAL LIGATION     Social History   Tobacco Use  . Smoking status: Never Smoker  . Smokeless tobacco: Never Used  Substance Use Topics  . Alcohol use: Yes    Alcohol/week: 4.0 standard drinks    Types: 4 Standard drinks or equivalent per week   family history includes Diabetes in her maternal aunt, maternal grandfather, maternal grandmother, and mother; Heart Problems in her maternal aunt; Heart attack in her maternal grandfather; Thyroid disease in her sister.    ROS: negative except as noted in the HPI  Medications: Current Outpatient Medications  Medication Sig Dispense Refill  . amLODipine (NORVASC) 5 MG tablet Take 1 tablet (5 mg total) by mouth daily. (Patient not taking: Reported on 03/03/2019) 30 tablet 5   No current facility-administered medications for this visit.    Allergies  Allergen Reactions  . Penicillins Other (See Comments)    Unknown, childhood reaction       Objective:  BP 97/63  Pulse 63   LMP 01/25/2019   Wt Readings from Last 3 Encounters:  03/03/19 250 lb (113.4 kg)  01/26/19 261 lb (118.4 kg)  07/30/16 236 lb (107 kg)   Temp Readings from Last 3 Encounters:  02/24/19 98.6 F (37 C) (Oral)  01/26/19 98.1 F (36.7 C) (Oral)  07/30/16 99.1 F (37.3 C) (Oral)   BP Readings from Last 3 Encounters:  03/03/19 122/82  02/24/19 127/89  02/09/19 97/63   Pulse Readings from Last 3 Encounters:  03/03/19 82  02/24/19 73  02/09/19 63    Gen:  alert, not ill-appearing, no distress, appropriate for age 9: head normocephalic without obvious abnormality, conjunctiva and cornea clear, trachea midline Pulm: Normal work of breathing, normal phonation Neuro: alert and oriented x 3 Psych: cooperative, speech is articulate, normal rate and volume; thought processes clear and goal-directed, normal judgment, good insight   Recent Results (from the past 2160 hour(s))  POCT HgB A1C      Status: None   Collection Time: 01/26/19  3:06 PM  Result Value Ref Range   Hemoglobin A1C     HbA1c POC (<> result, manual entry)     HbA1c, POC (prediabetic range) 6.0 5.7 - 6.4 %   HbA1c, POC (controlled diabetic range)    CBC     Status: Abnormal   Collection Time: 01/26/19  4:18 PM  Result Value Ref Range   WBC 9.5 3.8 - 10.8 Thousand/uL   RBC 3.98 3.80 - 5.10 Million/uL   Hemoglobin 14.1 11.7 - 15.5 g/dL   HCT 40.4 35.0 - 45.0 %   MCV 101.5 (H) 80.0 - 100.0 fL   MCH 35.4 (H) 27.0 - 33.0 pg   MCHC 34.9 32.0 - 36.0 g/dL   RDW 12.1 11.0 - 15.0 %   Platelets 342 140 - 400 Thousand/uL   MPV 9.1 7.5 - 12.5 fL  COMPLETE METABOLIC PANEL WITH GFR     Status: Abnormal   Collection Time: 01/26/19  4:18 PM  Result Value Ref Range   Glucose, Bld 99 65 - 99 mg/dL    Comment: .            Fasting reference interval .    BUN 11 7 - 25 mg/dL   Creat 0.61 0.50 - 1.10 mg/dL   GFR, Est Non African American 118 > OR = 60 mL/min/1.35m2   GFR, Est African American 137 > OR = 60 mL/min/1.36m2   BUN/Creatinine Ratio NOT APPLICABLE 6 - 22 (calc)   Sodium 140 135 - 146 mmol/L   Potassium 4.1 3.5 - 5.3 mmol/L   Chloride 103 98 - 110 mmol/L   CO2 26 20 - 32 mmol/L   Calcium 9.8 8.6 - 10.2 mg/dL   Total Protein 7.9 6.1 - 8.1 g/dL   Albumin 4.3 3.6 - 5.1 g/dL   Globulin 3.6 1.9 - 3.7 g/dL (calc)   AG Ratio 1.2 1.0 - 2.5 (calc)   Total Bilirubin 0.6 0.2 - 1.2 mg/dL   Alkaline phosphatase (APISO) 72 31 - 125 U/L   AST 86 (H) 10 - 30 U/L   ALT 139 (H) 6 - 29 U/L  TSH + free T4     Status: None   Collection Time: 01/26/19  4:18 PM  Result Value Ref Range   TSH W/REFLEX TO FT4 1.83 mIU/L    Comment:           Reference Range .           >  or = 20 Years  0.40-4.50 .                Pregnancy Ranges           First trimester    0.26-2.66           Second trimester   0.55-2.73           Third trimester    0.43-2.91   Lipid Panel w/reflex Direct LDL     Status: Abnormal   Collection Time:  01/26/19  4:18 PM  Result Value Ref Range   Cholesterol 204 (H) <200 mg/dL   HDL 65 > OR = 50 mg/dL   Triglycerides 87 <161<150 mg/dL   LDL Cholesterol (Calc) 120 (H) mg/dL (calc)    Comment: Reference range: <100 . Desirable range <100 mg/dL for primary prevention;   <70 mg/dL for patients with CHD or diabetic patients  with > or = 2 CHD risk factors. Marland Kitchen. LDL-C is now calculated using the Martin-Hopkins  calculation, which is a validated novel method providing  better accuracy than the Friedewald equation in the  estimation of LDL-C.  Horald PollenMartin SS et al. Lenox AhrJAMA. 0960;454(092013;310(19): 2061-2068  (http://education.[facility].com/faq/FAQ164)    Total CHOL/HDL Ratio 3.1 <5.0 (calc)   Non-HDL Cholesterol (Calc) 139 (H) <130 mg/dL (calc)    Comment: For patients with diabetes plus 1 major ASCVD risk  factor, treating to a non-HDL-C goal of <100 mg/dL  (LDL-C of <81<70 mg/dL) is considered a therapeutic  option.   Urinalysis, Routine w reflex microscopic     Status: Abnormal   Collection Time: 01/26/19  4:23 PM  Result Value Ref Range   Color, Urine YELLOW YELLOW   APPearance TURBID (A) CLEAR   Specific Gravity, Urine 1.023 1.001 - 1.03   pH < OR = 5.0 5.0 - 8.0   Glucose, UA NEGATIVE NEGATIVE   Bilirubin Urine NEGATIVE NEGATIVE   Ketones, ur NEGATIVE NEGATIVE   Hgb urine dipstick NEGATIVE NEGATIVE   Protein, ur NEGATIVE NEGATIVE   Nitrite NEGATIVE NEGATIVE   Leukocytes,Ua NEGATIVE NEGATIVE     Assessment and Plan: 33 y.o. female with   .Sara Simpson was seen today for follow-up and results.  Diagnoses and all orders for this visit:  Transaminitis -     Hepatic function panel -     Hepatitis panel, acute  Alcohol induced fatty liver   Continue to abstain from alcohol Counseled on diet for fatty liver Re-check fasting liver function and acute hepatitis panel   HTN BP low normal on home reading today Counseled on therapeutic lifestyle changes for HTN Cont home BP  monitoring Thankfully palpitations have resolved with avoidance of alcohol and lifestyle modification  Follow-up in 2 months or sooner if symptoms worsen or fail to improve  Follow Up Instructions:    I discussed the assessment and treatment plan with the patient. The patient was provided an opportunity to ask questions and all were answered. The patient agreed with the plan and demonstrated an understanding of the instructions.   The patient was advised to call back or seek an in-person evaluation if the symptoms worsen or if the condition fails to improve as anticipated.  I provided 10 minutes of non-face-to-face time during this encounter.   Carlis Stableharley Elizabeth Landree Fernholz, [place] JerseyPA-C

## 2019-02-10 ENCOUNTER — Encounter: Payer: Self-pay | Admitting: Physician Assistant

## 2019-02-10 DIAGNOSIS — R002 Palpitations: Secondary | ICD-10-CM

## 2019-02-10 DIAGNOSIS — I493 Ventricular premature depolarization: Secondary | ICD-10-CM

## 2019-02-10 DIAGNOSIS — I10 Essential (primary) hypertension: Secondary | ICD-10-CM

## 2019-02-11 LAB — HEPATITIS PANEL, ACUTE
Hep A IgM: NEGATIVE
Hep B C IgM: NEGATIVE
Hep C Virus Ab: <0.1 s/co ratio (ref 0.0–0.9)
Hepatitis B Surface Ag: NEGATIVE

## 2019-02-11 LAB — HEPATIC FUNCTION PANEL
ALT: 102 IU/L — ABNORMAL HIGH (ref 0–32)
AST: 54 IU/L — ABNORMAL HIGH (ref 0–40)
Albumin: 4.4 g/dL (ref 3.8–4.8)
Alkaline Phosphatase: 76 IU/L (ref 39–117)
Bilirubin Total: 0.6 mg/dL (ref 0.0–1.2)
Bilirubin, Direct: 0.21 mg/dL (ref 0.00–0.40)
Total Protein: 7.6 g/dL (ref 6.0–8.5)

## 2019-02-16 MED ORDER — AMLODIPINE BESYLATE 5 MG PO TABS: 5.0000 mg | ORAL_TABLET | Freq: Every day | ORAL | 5 refills | Status: DC

## 2019-02-24 ENCOUNTER — Encounter (HOSPITAL_COMMUNITY): Payer: Self-pay

## 2019-02-24 ENCOUNTER — Emergency Department (HOSPITAL_COMMUNITY)
Admission: EM | Admit: 2019-02-24 | Discharge: 2019-02-24 | Disposition: A | Discharge status: Home / Self Care | Payer: Self-pay | Attending: Emergency Medicine | Admitting: Emergency Medicine

## 2019-02-24 ENCOUNTER — Other Ambulatory Visit: Payer: Self-pay

## 2019-02-24 DIAGNOSIS — R002 Palpitations: Secondary | ICD-10-CM | POA: Insufficient documentation

## 2019-02-24 DIAGNOSIS — I10 Essential (primary) hypertension: Secondary | ICD-10-CM | POA: Insufficient documentation

## 2019-02-24 DIAGNOSIS — J45909 Unspecified asthma, uncomplicated: Secondary | ICD-10-CM | POA: Insufficient documentation

## 2019-02-24 LAB — URINALYSIS, ROUTINE W REFLEX MICROSCOPIC
Bilirubin Urine: NEGATIVE
Glucose, UA: NEGATIVE mg/dL
Hgb urine dipstick: NEGATIVE
Ketones, ur: NEGATIVE mg/dL
Leukocytes,Ua: NEGATIVE
Nitrite: NEGATIVE
Protein, ur: NEGATIVE mg/dL
Specific Gravity, Urine: 1.011 (ref 1.005–1.030)
pH: 6 (ref 5.0–8.0)

## 2019-02-24 LAB — COMPREHENSIVE METABOLIC PANEL
ALT: 76 U/L — ABNORMAL HIGH (ref 0–44)
AST: 42 U/L — ABNORMAL HIGH (ref 15–41)
Albumin: 4.3 g/dL (ref 3.5–5.0)
Alkaline Phosphatase: 71 U/L (ref 38–126)
Anion gap: 13 (ref 5–15)
BUN: 8 mg/dL (ref 6–20)
CO2: 25 mmol/L (ref 22–32)
Calcium: 10.1 mg/dL (ref 8.9–10.3)
Chloride: 102 mmol/L (ref 98–111)
Creatinine, Ser: 0.62 mg/dL (ref 0.44–1.00)
GFR calc Af Amer: >60 mL/min (ref >60)
GFR calc non Af Amer: >60 mL/min (ref >60)
Glucose, Bld: 123 mg/dL — ABNORMAL HIGH (ref 70–99)
Potassium: 3.9 mmol/L (ref 3.5–5.1)
Sodium: 140 mmol/L (ref 135–145)
Total Bilirubin: 0.9 mg/dL (ref 0.3–1.2)
Total Protein: 8.7 g/dL — ABNORMAL HIGH (ref 6.5–8.1)

## 2019-02-24 LAB — CBC WITH DIFFERENTIAL/PLATELET
Abs Immature Granulocytes: 0.02 10*3/uL (ref 0.00–0.07)
Basophils Absolute: 0 10*3/uL (ref 0.0–0.1)
Basophils Relative: 0 %
Eosinophils Absolute: 0.2 10*3/uL (ref 0.0–0.5)
Eosinophils Relative: 2 %
HCT: 42.1 % (ref 36.0–46.0)
Hemoglobin: 14.4 g/dL (ref 12.0–15.0)
Immature Granulocytes: 0 %
Lymphocytes Relative: 43 %
Lymphs Abs: 4 10*3/uL (ref 0.7–4.0)
MCH: 35.1 pg — ABNORMAL HIGH (ref 26.0–34.0)
MCHC: 34.2 g/dL (ref 30.0–36.0)
MCV: 102.7 fL — ABNORMAL HIGH (ref 80.0–100.0)
Monocytes Absolute: 0.6 10*3/uL (ref 0.1–1.0)
Monocytes Relative: 6 %
Neutro Abs: 4.6 10*3/uL (ref 1.7–7.7)
Neutrophils Relative %: 49 %
Platelets: 302 10*3/uL (ref 150–400)
RBC: 4.1 MIL/uL (ref 3.87–5.11)
RDW: 11.5 % (ref 11.5–15.5)
WBC: 9.4 10*3/uL (ref 4.0–10.5)
nRBC: 0 % (ref 0.0–0.2)

## 2019-02-24 LAB — TSH: TSH: 4.182 u[IU]/mL (ref 0.350–4.500)

## 2019-02-24 LAB — LIPASE, BLOOD: Lipase: 30 U/L (ref 11–51)

## 2019-02-24 NOTE — ED Provider Notes (Signed)
I received this patient in signout from Dr. Dayna Barker.  Briefly, she had presented with heart palpitations and hypertension, recent history of alcohol abuse but has been sober for 30 days.  No evidence of alcohol withdrawal on his initial exam.  At time of signout, we were awaiting screening lab work.  No symptoms suggestive of ACS or PE.  Lab work is overall reassuring, very mild elevation of LFTs which is consistent with alcohol history.  Patient resting comfortably on repeat exam with normal vital signs, normal heart rate and blood pressure.  I discussed abortive measures for palpitations, avoidance of caffeine or other stimulants, and PCP follow-up for reassessment as she may be considered for Holter monitoring if symptoms continue.  I extensively reviewed return precautions and she voiced understanding.   Sara Simpson, Wenda Overland, MD 02/24/19 1050

## 2019-02-24 NOTE — ED Notes (Signed)
Pt d/c home per MD order. Discharge summary reviewed with pt, pt verbalizes understanding. No complaints at discharge, ambulatory off unit.

## 2019-02-24 NOTE — ED Triage Notes (Signed)
Pt complains of not feeling well, was recently dx with hypertension but hasn't started her meds yet, She states that she's very fatigued and wants to sleep alot

## 2019-02-24 NOTE — ED Notes (Signed)
Writer attempt twice to collect labs. Unable to collect enough for testing. RN have been made aware

## 2019-02-24 NOTE — Progress Notes (Signed)
IV team called to ED to start IV for labs on this pt. There is no order for IVF. Pt. Doesn't want an IV. Informed RN that IV team doesn't stick just for lab work.

## 2019-02-24 NOTE — ED Provider Notes (Signed)
[facility]-EMERGENCY DEPT Provider Note   CSN: [id number] Arrival date & time: 02/24/19  0305     History   Chief Complaint No chief complaint on file.   HPI Sara Simpson is a 33 y.o. female.      Palpitations Palpitations quality:  Regular Onset quality:  Sudden Timing:  Constant Chronicity:  New Context: not anxiety and not caffeine   Relieved by:  None tried Worsened by:  Nothing Ineffective treatments:  None tried Associated symptoms: weakness   Associated symptoms: no cough, no lower extremity edema and no nausea     Past Medical History:  Diagnosis Date  . Anxiety   . Asthma   . Eczema   . History of anemia   . Hypertension   . Palpitations   . Transaminitis     Patient Active Problem List   Diagnosis Date Noted  . VPC's (ventricular premature complexes) 02/01/2019  . Hypertension goal BP (blood pressure) < 130/80 02/01/2019  . Prediabetes 02/01/2019  . NAFL (nonalcoholic fatty liver) 01/30/2019  . Transaminitis 01/28/2019    Past Surgical History:  Procedure Laterality Date  . TUBAL LIGATION       OB History   No obstetric history on file.      Home Medications    Prior to Admission medications   Medication Sig Start Date End Date Taking? Authorizing Provider  amLODipine (NORVASC) 5 MG tablet Take 1 tablet (5 mg total) by mouth daily. 02/16/19   Carlis Stableummings, Charley Elizabeth, PA-C    Family History Family History  Problem Relation Age of Onset  . Diabetes Mother   . Thyroid disease Sister   . Diabetes Maternal Aunt   . Heart Problems Maternal Aunt   . Diabetes Maternal Grandmother   . Heart attack Maternal Grandfather   . Diabetes Maternal Grandfather     Social History Social History   Tobacco Use  . Smoking status: Never Smoker  . Smokeless tobacco: Never Used  Substance Use Topics  . Alcohol use: Yes    Alcohol/week: 4.0 standard drinks    Types: 4 Standard drinks or equivalent per week  . Drug  use: Never     Allergies   Penicillins   Review of Systems Review of Systems  Respiratory: Negative for cough.   Cardiovascular: Positive for palpitations.  Gastrointestinal: Negative for nausea.  Neurological: Positive for weakness.  All other systems reviewed and are negative.    Physical Exam Updated Vital Signs BP (!) 158/107 (BP Location: Right Arm)   Pulse 100   Temp 98.3 F (36.8 C) (Oral)   Resp 20   LMP 01/25/2019   SpO2 100%   Physical Exam Vitals signs and nursing note reviewed.  Constitutional:      Appearance: She is well-developed.  HENT:     Head: Normocephalic and atraumatic.     Mouth/Throat:     Pharynx: Oropharynx is clear.  Eyes:     Conjunctiva/sclera: Conjunctivae normal.  Neck:     Musculoskeletal: Normal range of motion.  Cardiovascular:     Rate and Rhythm: Normal rate and regular rhythm.  Pulmonary:     Effort: Pulmonary effort is normal. No respiratory distress.     Breath sounds: No stridor.  Abdominal:     General: There is no distension.     Tenderness: There is no abdominal tenderness.  Skin:    General: Skin is warm and dry.     Coloration: Skin is not jaundiced.  Neurological:  General: No focal deficit present.     Mental Status: She is alert.      ED Treatments / Results  Labs (all labs ordered are listed, but only abnormal results are displayed) Labs Reviewed  URINALYSIS, ROUTINE W REFLEX MICROSCOPIC  CBC WITH DIFFERENTIAL/PLATELET  COMPREHENSIVE METABOLIC PANEL  LIPASE, BLOOD  TSH  I-STAT BETA HCG BLOOD, ED ([facility], [facility], [facility] ONLY)    EKG EKG Interpretation  Date/Time:  Tuesday February 24 2019 05:18:22 EDT Ventricular Rate:  82 PR Interval:    QRS Duration: 84 QT Interval:  396 QTC Calculation: 463 R Axis:   65 Text Interpretation:  Sinus rhythm No significant change since last tracing Confirmed by Merrily Pew [phone]) on 02/24/2019 5:32:36 AM   Radiology No results found.  Procedures Procedures  (including critical care time)  Medications Ordered in ED Medications - No data to display   Initial Impression / Assessment and Plan / ED Course  I have reviewed the triage vital signs and the nursing notes.  Pertinent labs & imaging results that were available during my care of the patient were reviewed by me and considered in my medical decision making (see chart for details).        eval for DM, hyponatremia, ARF. Doubt ACS.   Care transferred pending labs and likely discharge.   Final Clinical Impressions(s) / ED Diagnoses   Final diagnoses:  None    ED Discharge Orders    None       Eliana Lueth, Corene Cornea, MD 02/24/19 0730

## 2019-02-27 ENCOUNTER — Encounter: Payer: Self-pay | Admitting: Physician Assistant

## 2019-02-27 NOTE — Telephone Encounter (Signed)
Please schedule patient for 40 min hospital follow-up next week

## 2019-03-03 ENCOUNTER — Encounter: Payer: Self-pay | Admitting: Cardiology

## 2019-03-03 ENCOUNTER — Other Ambulatory Visit: Payer: Self-pay

## 2019-03-03 ENCOUNTER — Ambulatory Visit (INDEPENDENT_AMBULATORY_CARE_PROVIDER_SITE_OTHER): Payer: Self-pay | Admitting: Cardiology

## 2019-03-03 VITALS — BP 122/82 | HR 82 | Ht 65.0 in | Wt 250.0 lb

## 2019-03-03 DIAGNOSIS — K700 Alcoholic fatty liver: Secondary | ICD-10-CM | POA: Insufficient documentation

## 2019-03-03 DIAGNOSIS — I10 Essential (primary) hypertension: Secondary | ICD-10-CM

## 2019-03-03 DIAGNOSIS — R002 Palpitations: Secondary | ICD-10-CM

## 2019-03-03 DIAGNOSIS — R7303 Prediabetes: Secondary | ICD-10-CM

## 2019-03-03 DIAGNOSIS — I493 Ventricular premature depolarization: Secondary | ICD-10-CM

## 2019-03-03 NOTE — Progress Notes (Signed)
Cardiology Office Note:    Date:  03/03/2019   ID:  Sara Simpson, DOB [birth year], MRN [id number]  PCP:  Carlis Stable, PA-C  Cardiologist:  Thomasene Ripple, DO  Electrophysiologist:  None   Referring MD: Donzetta Kohut*   " I am here to be evaluated for palpitations"  History of Present Illness:    Sara Simpson is a 33 y.o. female with a history of hypertension, Anxiety, Asthma and palpitations who presents to be for evaluation for palpitations. She reports that she has been experiencing palpitations on and off for several months.  She reports that initially when the palpitations started it occurred once every other month as an abrupt onset of rapid heartbeat which lasted for about 5 to 10 minutes and resolved on its own.  She notes that several years ago she experienced these episodes and was started on Zoloft for anxiety and this seemed to have helped greatly.  The Zoloft has since been stopped.  But recently patient reports that she has been in experiencing these episodes again which she is about 2-3 times a month and seems to be increasing in severity.    She notes that her most recent episode was at nighttime after she had dinner with her friends once home right before bed she again had a sudden onset of palpitations which lasted about an hour wanting her to go to the ED to be evaluated.  She admits to headaches and nausea with this at this time.  She reports that while in the ED prior to being evaluated the palpitation resolved and all of her diagnostic testing was reported to be normal and she was discharged home.  Today in office she denies palpitations, chest pain, lightheadedness or dizziness.  Past Medical History:  Diagnosis Date   Anxiety    Asthma    Eczema    History of anemia    Hypertension    Palpitations    Transaminitis     Past Surgical History:  Procedure Laterality Date   TUBAL LIGATION      Current Medications: No outpatient  medications have been marked as taking for the 03/03/19 encounter (Office Visit) with Thomasene Ripple, DO.     Allergies:   Penicillins   Social History   Socioeconomic History   Marital status: Single    Spouse name: Not on file   Number of children: Not on file   Years of education: Not on file   Highest education level: Not on file  Occupational History   Not on file  Social Needs   Financial resource strain: Not on file   Food insecurity    Worry: Not on file    Inability: Not on file   Transportation needs    Medical: Not on file    Non-medical: Not on file  Tobacco Use   Smoking status: Never Smoker   Smokeless tobacco: Never Used  Substance and Sexual Activity   Alcohol use: Yes    Alcohol/week: 4.0 standard drinks    Types: 4 Standard drinks or equivalent per week   Drug use: Never   Sexual activity: Not Currently    Birth control/protection: Surgical    Comment: tubal  Lifestyle   Physical activity    Days per week: Not on file    Minutes per session: Not on file   Stress: Not on file  Relationships   Social connections    Talks on phone: Not on file    Gets together: Not  on file    Attends religious service: Not on file    Active member of club or organization: Not on file    Attends meetings of clubs or organizations: Not on file    Relationship status: Not on file  Other Topics Concern   Not on file  Social History Narrative   Not on file     Family History: The patient's family history includes Diabetes in her maternal aunt, maternal grandfather, maternal grandmother, and mother; Heart Problems in her maternal aunt; Heart attack in her maternal grandfather; Thyroid disease in her sister.  ROS:   Please see the history of present illness.   Review of Systems  Constitution: Negative for diaphoresis, fever and weight loss.  HENT: Positive for sore throat. Negative for congestion, ear discharge and hearing loss.   Eyes: Negative for  blurred vision, double vision and pain.  Cardiovascular: Positive for palpitations. Negative for chest pain, leg swelling, orthopnea and paroxysmal nocturnal dyspnea.  Respiratory: Negative for cough, hemoptysis, shortness of breath and wheezing.   Hematologic/Lymphatic: Does not bruise/bleed easily.  Musculoskeletal: Positive for joint pain. Negative for back pain and myalgias.  Gastrointestinal: Positive for heartburn. Negative for blood in stool, constipation, nausea and vomiting.  Genitourinary: Negative for dysuria, hematuria and urgency.  Neurological: Positive for dizziness. Negative for seizures, sensory change and weakness.  Psychiatric/Behavioral: Positive for depression. Negative for memory loss, substance abuse and suicidal ideas. The patient is nervous/anxious.    Review of Systems  Constitutional: Negative for diaphoresis, fever and weight loss.  HENT: Positive for sore throat. Negative for congestion, ear discharge and hearing loss.   Eyes: Negative for blurred vision, double vision and pain.  Respiratory: Negative for cough, hemoptysis, shortness of breath and wheezing.   Cardiovascular: Positive for palpitations. Negative for chest pain, orthopnea, leg swelling and PND.  Gastrointestinal: Positive for heartburn. Negative for blood in stool, constipation, nausea and vomiting.  Genitourinary: Negative for dysuria, hematuria and urgency.  Musculoskeletal: Positive for joint pain. Negative for back pain and myalgias.  Neurological: Positive for dizziness. Negative for sensory change, seizures and weakness.  Endo/Heme/Allergies: Does not bruise/bleed easily.  Psychiatric/Behavioral: Positive for depression. Negative for memory loss, substance abuse and suicidal ideas. The patient is nervous/anxious.      EKGs/Labs/Other Studies Reviewed:    EKG: EKG performed on February 24, 2019 reviewed by me show evidence of sinus rhythm, heart rate 80 bpm which is similar to previous  ekg.  Recent Labs: 02/24/2019: ALT 76; BUN 8; Creatinine, Ser 0.62; Hemoglobin 14.4; Platelets 302; Potassium 3.9; Sodium 140; TSH 4.182  Recent Lipid Panel    Component Value Date/Time   CHOL 204 (H) 01/26/2019 1618   TRIG 87 01/26/2019 1618   HDL 65 01/26/2019 1618   CHOLHDL 3.1 01/26/2019 1618   LDLCALC 120 (H) 01/26/2019 1618    Physical Exam:    VS:  BP 122/82 (BP Location: Left Arm, Patient Position: Sitting, Cuff Size: Large)    Pulse 82    Ht 5\' 5"  (1.651 m)    Wt 250 lb (113.4 kg)    SpO2 98%    BMI 41.60 kg/m     Wt Readings from Last 3 Encounters:  03/03/19 250 lb (113.4 kg)  01/26/19 261 lb (118.4 kg)  07/30/16 236 lb (107 kg)     GEN: Obese female, well nourished, well developed in no acute distress HEENT: Normal NECK: No JVD; No carotid bruits LYMPHATICS: No lymphadenopathy CARDIAC: S1, S2 noted RRR, no murmurs,  rubs, gallops RESPIRATORY:  Clear to auscultation without rales, wheezing or rhonchi  ABDOMEN: Soft, non-tender, non-distended EXTREMITIES:trace ankle edema, No cyanosis, no clubbing. MUSCULOSKELETAL:  No edema; No deformity  SKIN: Warm and dry NEUROLOGIC:  Alert and oriented x 3, nonfocal PSYCHIATRIC:  Normal affect   ASSESSMENT:    1. Palpitations   2. VPC's (ventricular premature complexes)   3. Morbid obesity ([place])   4. Prediabetes   5. Hypertension goal BP (blood pressure) < 130/80    PLAN:    [place] is her today for evaluation of her palpitations. At this time, I do recommend she wear an ambulatory monitor to assess for HR excursion and any arrhythmias. I do suspect that this may be sinus tachycardia due to underlying untreated anxiety disorder.  A transthoracic echocardiogram has been ordered to assess for structural abnormalities.  Morbid obesity-lifestyle modification was advised.  The patient reported that she has been working on diet and weight loss.  I also did advise patient that she may need a sleep study as she does report history  of snoring.  She has a history of hypertension and her blood pressure was acceptable in the office today.  She does not take any medication though amlodipine 5 mg has been listed in her chart however the patient reports that she does not take this medication.  Advised patient to watch her salt intake and opt for healthier choices in her diet.  The patient is in agreement with the above plan.  She will follow-up with me in 3 months.   Medication Adjustments/Labs and Tests Ordered: Current medicines are reviewed at length with the patient today.  Concerns regarding medicines are outlined above.  Orders Placed This Encounter  Procedures   ECHOCARDIOGRAM COMPLETE   No orders of the defined types were placed in this encounter.  DASH Diet: Care Instructions Your Care Instructions The DASH diet is an eating plan that can help lower your blood pressure. DASH stands for Dietary Approaches to Stop Hypertension. Hypertension is high blood pressure. The DASH diet focuses on eating foods that are high in calcium, potassium, and magnesium. These nutrients can lower blood pressure. The foods that are highest in these nutrients are fruits, vegetables, low-fat dairy products, nuts, seeds, and legumes. But taking calcium, potassium, and magnesium supplements instead of eating foods that are high in those nutrients does not have the same effect. The DASH diet also includes whole grains, fish, and poultry. The DASH diet is one of several lifestyle changes your doctor may recommend to lower your high blood pressure. Your doctor may also want you to decrease the amount of sodium in your diet. Lowering sodium while following the DASH diet can lower blood pressure even further than just the DASH diet alone. Follow-up care is a key part of your treatment and safety. Be sure to make and go to all appointments, and call your doctor if you are having problems. It's also a good idea to know your test results and keep a list  of the medicines you take. How can you care for yourself at home? Following the DASH diet  Eat 4 to 5 servings of fruit each day. A serving is 1 medium-sized piece of fruit,  cup chopped or canned fruit, 1/4 cup dried fruit, or 4 ounces ( cup) of fruit juice. Choose fruit more often than fruit juice.  Eat 4 to 5 servings of vegetables each day. A serving is 1 cup of lettuce or raw leafy vegetables,  cup  of chopped or cooked vegetables, or 4 ounces ( cup) of vegetable juice. Choose vegetables more often than vegetable juice.  Get 2 to 3 servings of low-fat and fat-free dairy each day. A serving is 8 ounces of milk, 1 cup of yogurt, or 1  ounces of cheese.  Eat 6 to 8 servings of grains each day. A serving is 1 slice of bread, 1 ounce of dry cereal, or  cup of cooked rice, pasta, or cooked cereal. Try to choose whole-grain products as much as possible.  Limit lean meat, poultry, and fish to 2 servings each day. A serving is 3 ounces, about the size of a deck of cards.  Eat 4 to 5 servings of nuts, seeds, and legumes (cooked dried beans, lentils, and split peas) each week. A serving is 1/3 cup of nuts, 2 tablespoons of seeds, or  cup of cooked beans or peas.  Limit fats and oils to 2 to 3 servings each day. A serving is 1 teaspoon of vegetable oil or 2 tablespoons of salad dressing.  Limit sweets and added sugars to 5 servings or less a week. A serving is 1 tablespoon jelly or jam,  cup sorbet, or 1 cup of lemonade.  Eat less than 2,300 milligrams (mg) of sodium a day. If you limit your sodium to 1,500 mg a day, you can lower your blood pressure even more. Tips for success  Start small. Do not try to make dramatic changes to your diet all at once. You might feel that you are missing out on your favorite foods and then be more likely to not follow the plan. Make small changes, and stick with them. Once those changes become habit, add a few more changes.  Try some of the following:   Make it a goal to eat a fruit or vegetable at every meal and at snacks. This will make it easy to get the recommended amount of fruits and vegetables each day.  Try yogurt topped with fruit and nuts for a snack or healthy dessert.  Add lettuce, tomato, cucumber, and onion to sandwiches.  Combine a ready-made pizza crust with low-fat mozzarella cheese and lots of vegetable toppings. Try using tomatoes, squash, spinach, broccoli, carrots, cauliflower, and onions.  Have a variety of cut-up vegetables with a low-fat dip as an appetizer instead of chips and dip.  Sprinkle sunflower seeds or chopped almonds over salads. Or try adding chopped walnuts or almonds to cooked vegetables.  Try some vegetarian meals using beans and peas. Add garbanzo or kidney beans to salads. Make burritos and tacos with mashed pinto beans or black beans. Where can you learn more? Go to https://carlson-fletcher.info/https://myuncchart.org. Enter [phone] in the search box to learn more about "DASH Diet: Care Instructions." Current as of: September 15, 2014 Content Version: 11.1  2006-2016 [employer]. Care instructions adapted under license by [facility]. If you have questions about a medical condition or this instruction, always ask your healthcare professional. [employer] disclaims any warranty or liability for your use of this information.      Patient Instructions  Your physician recommends that you continue on your current medications as directed. Please refer to the Current Medication list given to you today.  If you need a refill on your cardiac medications before your next appointment, please call your pharmacy.   Lab work: None If you have labs (blood work) drawn today and your tests are completely normal, you will receive your results only by:  MyChart  Message (if you have MyChart) OR  A paper copy in the mail If you have any lab test that is abnormal or we need to change your treatment, we will call you  to review the results.  Testing/Procedures: Your physician has requested that you have an echocardiogram. Echocardiography is a painless test that uses sound waves to create images of your heart. It provides your doctor with information about the size and shape of your heart and how well your hearts chambers and valves are working. This procedure takes approximately one hour. There are no restrictions for this procedure.  Your physician has recommended that you wear a ZIO monitor. ZIO monitors are medical devices that record the hearts electrical activity. Doctors most often use these monitors to diagnose arrhythmias. Arrhythmias are problems with the speed or rhythm of the heartbeat. The monitor is a small, portable device. You can wear one while you do your normal daily activities. This is usually used to diagnose what is causing palpitations/syncope (passing out).  Wear 14 days  Follow-Up: At [facility], you and your health needs are our priority.  As part of our continuing mission to provide you with exceptional heart care, we have created designated Provider Care Teams.  These Care Teams include your primary Cardiologist (physician) and Advanced Practice Providers (APPs -  Physician Assistants and Nurse Practitioners) who all work together to provide you with the care you need, when you need it. You will need a follow up appointment in 3 months with Dr Servando Salina Any Other Special Instructions Will Be Listed Below (If Applicable).           SignedThomasene Ripple, DO  03/03/2019 6:18 PM    [facility]

## 2019-03-03 NOTE — Patient Instructions (Addendum)
Your physician recommends that you continue on your current medications as directed. Please refer to the Current Medication list given to you today.  If you need a refill on your cardiac medications before your next appointment, please call your pharmacy.   Lab work: None If you have labs (blood work) drawn today and your tests are completely normal, you will receive your results only by: Marland Kitchen MyChart Message (if you have MyChart) OR . A paper copy in the mail If you have any lab test that is abnormal or we need to change your treatment, we will call you to review the results.  Testing/Procedures: Your physician has requested that you have an echocardiogram. Echocardiography is a painless test that uses sound waves to create images of your heart. It provides your doctor with information about the size and shape of your heart and how well your heart's chambers and valves are working. This procedure takes approximately one hour. There are no restrictions for this procedure.  Your physician has recommended that you wear a ZIO monitor. ZIO monitors are medical devices that record the heart's electrical activity. Doctors most often use these monitors to diagnose arrhythmias. Arrhythmias are problems with the speed or rhythm of the heartbeat. The monitor is a small, portable device. You can wear one while you do your normal daily activities. This is usually used to diagnose what is causing palpitations/syncope (passing out).  Wear 14 days  Follow-Up: At [facility], you and your health needs are our priority.  As part of our continuing mission to provide you with exceptional heart care, we have created designated Provider Care Teams.  These Care Teams include your primary Cardiologist (physician) and Advanced Practice Providers (APPs -  Physician Assistants and Nurse Practitioners) who all work together to provide you with the care you need, when you need it. You will need a follow up appointment in 3  months with Dr Harriet Masson Any Other Special Instructions Will Be Listed Below (If Applicable).

## 2019-03-06 ENCOUNTER — Encounter: Payer: Self-pay | Admitting: Physician Assistant

## 2019-03-06 ENCOUNTER — Other Ambulatory Visit: Payer: Self-pay

## 2019-03-06 ENCOUNTER — Ambulatory Visit (INDEPENDENT_AMBULATORY_CARE_PROVIDER_SITE_OTHER): Payer: Self-pay | Admitting: Physician Assistant

## 2019-03-06 ENCOUNTER — Ambulatory Visit (HOSPITAL_BASED_OUTPATIENT_CLINIC_OR_DEPARTMENT_OTHER)
Admission: RE | Admit: 2019-03-06 | Discharge: 2019-03-06 | Disposition: A | Discharge status: Home / Self Care | Payer: Self-pay | Source: Ambulatory Visit | Attending: Cardiology | Admitting: Cardiology

## 2019-03-06 VITALS — BP 120/84 | HR 70 | Temp 98.1°F | Wt 250.0 lb

## 2019-03-06 DIAGNOSIS — I493 Ventricular premature depolarization: Secondary | ICD-10-CM | POA: Insufficient documentation

## 2019-03-06 DIAGNOSIS — F411 Generalized anxiety disorder: Secondary | ICD-10-CM

## 2019-03-06 MED ORDER — HYDROXYZINE HCL 10 MG PO TABS: 10.0000 mg | ORAL_TABLET | Freq: Three times a day (TID) | ORAL | 1 refills | Status: DC | PRN

## 2019-03-06 MED ORDER — SERTRALINE HCL 25 MG PO TABS: ORAL_TABLET | ORAL | 1 refills | Status: DC

## 2019-03-06 NOTE — Progress Notes (Signed)
HPI:                                                                Sara Simpson is a 33 y.o. female who presents to [facility] today for anxiety  Reports longstanding history since childhood and diagnosed formally as a teenager. She states she has been on and off Zoloft throughout her life, most recently 5-6 years ago, and she states it wasn't that effective for her that time. She is interested in re-starting Zoloft today. Denies symptoms of mania/hypomania. Denies suicidal thinking. Denies auditory/visual hallucinations.   Denis any psychiatric hospitalizations  Substance hx: She states drinking was an escape from her anxiety. She started drinking alcohol at a young age (12-13 years), gradually started drinking more heavily in her late 20's triggered by a bad break-up. History of DUI in early 20's.  She states she has been sober since 01/25/19 and she has not had any cravings. She does not use any recreational drugs  Prior medications: Fluoxetine- made me feel crazy  Family hx Maternal aunts - alcohol use disorder Cousin - schizophrenia  MGM - schizophrenia  Depression screen PHQ 2/9 01/26/2019  Decreased Interest 0  Down, Depressed, Hopeless 0  PHQ - 2 Score 0  Altered sleeping 2  Tired, decreased energy 2  Change in appetite 0  Feeling bad or failure about yourself  0  Trouble concentrating 0  Moving slowly or fidgety/restless 0  Suicidal thoughts 0  PHQ-9 Score 4    GAD 7 : Generalized Anxiety Score 01/26/2019  Nervous, Anxious, on Edge 1  Control/stop worrying 1  Worry too much - different things 1  Trouble relaxing 1  Restless 0  Easily annoyed or irritable 1  Afraid - awful might happen 0  Total GAD 7 Score 5      Past Medical History:  Diagnosis Date  . Anxiety   . Asthma   . Eczema   . History of anemia   . Hypertension   . Palpitations   . Transaminitis    Past Surgical History:  Procedure  Laterality Date  . TUBAL LIGATION     Social History   Tobacco Use  . Smoking status: Never Smoker  . Smokeless tobacco: Never Used  Substance Use Topics  . Alcohol use: Yes    Alcohol/week: 4.0 standard drinks    Types: 4 Standard drinks or equivalent per week   family history includes Diabetes in her maternal aunt, maternal grandfather, maternal grandmother, and mother; Heart Problems in her maternal aunt; Heart attack in her maternal grandfather; Thyroid disease in her sister.    ROS: negative except as noted in the HPI  Medications: Current Outpatient Medications  Medication Sig Dispense Refill  . amLODipine (NORVASC) 5 MG tablet Take 1 tablet (5 mg total) by mouth daily. (Patient not taking: Reported on 03/03/2019) 30 tablet 5   No current facility-administered medications for this visit.    Allergies  Allergen Reactions  . Penicillins Other (See Comments)    Unknown, childhood reaction       Objective:  BP 120/84   Pulse 70   Temp 98.1 F (36.7 C) (Oral)   Wt 250 lb (113.4 kg)   SpO2 98%   BMI  41.60 kg/m   Wt Readings from Last 3 Encounters:  03/06/19 250 lb (113.4 kg)  03/03/19 250 lb (113.4 kg)  01/26/19 261 lb (118.4 kg)   Temp Readings from Last 3 Encounters:  03/06/19 98.1 F (36.7 C) (Oral)  02/24/19 98.6 F (37 C) (Oral)  01/26/19 98.1 F (36.7 C) (Oral)   BP Readings from Last 3 Encounters:  03/06/19 120/84  03/03/19 122/82  02/24/19 127/89   Pulse Readings from Last 3 Encounters:  03/06/19 70  03/03/19 82  02/24/19 73    Gen:  alert, not ill-appearing, no distress, appropriate for age 35: head normocephalic without obvious abnormality, conjunctiva and cornea clear, trachea midline Pulm: Normal work of breathing, normal phonation, clear to auscultation bilaterally, no wheezes, rales or rhonchi CV: Normal rate, regular rhythm, s1 and s2 distinct, no murmurs, clicks or rubs  Neuro: alert and oriented x 3, no tremor MSK: extremities  atraumatic, normal gait and station Skin: intact, no rashes on exposed skin, no jaundice, no cyanosis Psych: well-groomed, cooperative, good eye contact, anxious mood, affect mood-congruent, speech is articulate, thought processes clear and goal-directed, no SI/HI   Recent Results (from the past 2160 hour(s))  POCT HgB A1C     Status: None   Collection Time: 01/26/19  3:06 PM  Result Value Ref Range   Hemoglobin A1C     HbA1c POC (<> result, manual entry)     HbA1c, POC (prediabetic range) 6.0 5.7 - 6.4 %   HbA1c, POC (controlled diabetic range)    CBC     Status: Abnormal   Collection Time: 01/26/19  4:18 PM  Result Value Ref Range   WBC 9.5 3.8 - 10.8 Thousand/uL   RBC 3.98 3.80 - 5.10 Million/uL   Hemoglobin 14.1 11.7 - 15.5 g/dL   HCT 40.4 35.0 - 45.0 %   MCV 101.5 (H) 80.0 - 100.0 fL   MCH 35.4 (H) 27.0 - 33.0 pg   MCHC 34.9 32.0 - 36.0 g/dL   RDW 12.1 11.0 - 15.0 %   Platelets 342 140 - 400 Thousand/uL   MPV 9.1 7.5 - 12.5 fL  COMPLETE METABOLIC PANEL WITH GFR     Status: Abnormal   Collection Time: 01/26/19  4:18 PM  Result Value Ref Range   Glucose, Bld 99 65 - 99 mg/dL    Comment: .            Fasting reference interval .    BUN 11 7 - 25 mg/dL   Creat 0.61 0.50 - 1.10 mg/dL   GFR, Est Non African American 118 > OR = 60 mL/min/1.33m2   GFR, Est African American 137 > OR = 60 mL/min/1.58m2   BUN/Creatinine Ratio NOT APPLICABLE 6 - 22 (calc)   Sodium 140 135 - 146 mmol/L   Potassium 4.1 3.5 - 5.3 mmol/L   Chloride 103 98 - 110 mmol/L   CO2 26 20 - 32 mmol/L   Calcium 9.8 8.6 - 10.2 mg/dL   Total Protein 7.9 6.1 - 8.1 g/dL   Albumin 4.3 3.6 - 5.1 g/dL   Globulin 3.6 1.9 - 3.7 g/dL (calc)   AG Ratio 1.2 1.0 - 2.5 (calc)   Total Bilirubin 0.6 0.2 - 1.2 mg/dL   Alkaline phosphatase (APISO) 72 31 - 125 U/L   AST 86 (H) 10 - 30 U/L   ALT 139 (H) 6 - 29 U/L  TSH + free T4     Status: None   Collection Time: 01/26/19  4:18 PM  Result Value Ref Range   TSH  W/REFLEX TO FT4 1.83 mIU/L    Comment:           Reference Range .           > or = 20 Years  0.40-4.50 .                Pregnancy Ranges           First trimester    0.26-2.66           Second trimester   0.55-2.73           Third trimester    0.43-2.91   Lipid Panel w/reflex Direct LDL     Status: Abnormal   Collection Time: 01/26/19  4:18 PM  Result Value Ref Range   Cholesterol 204 (H) <200 mg/dL   HDL 65 > OR = 50 mg/dL   Triglycerides 87 <096<150 mg/dL   LDL Cholesterol (Calc) 120 (H) mg/dL (calc)    Comment: Reference range: <100 . Desirable range <100 mg/dL for primary prevention;   <70 mg/dL for patients with CHD or diabetic patients  with > or = 2 CHD risk factors. Marland Kitchen. LDL-C is now calculated using the Martin-Hopkins  calculation, which is a validated novel method providing  better accuracy than the Friedewald equation in the  estimation of LDL-C.  Horald PollenMartin SS et al. Lenox AhrJAMA. 0454;098(112013;310(19): 2061-2068  (http://education.[facility].com/faq/FAQ164)    Total CHOL/HDL Ratio 3.1 <5.0 (calc)   Non-HDL Cholesterol (Calc) 139 (H) <130 mg/dL (calc)    Comment: For patients with diabetes plus 1 major ASCVD risk  factor, treating to a non-HDL-C goal of <100 mg/dL  (LDL-C of <91<70 mg/dL) is considered a therapeutic  option.   Urinalysis, Routine w reflex microscopic     Status: Abnormal   Collection Time: 01/26/19  4:23 PM  Result Value Ref Range   Color, Urine YELLOW YELLOW   APPearance TURBID (A) CLEAR   Specific Gravity, Urine 1.023 1.001 - 1.03   pH < OR = 5.0 5.0 - 8.0   Glucose, UA NEGATIVE NEGATIVE   Bilirubin Urine NEGATIVE NEGATIVE   Ketones, ur NEGATIVE NEGATIVE   Hgb urine dipstick NEGATIVE NEGATIVE   Protein, ur NEGATIVE NEGATIVE   Nitrite NEGATIVE NEGATIVE   Leukocytes,Ua NEGATIVE NEGATIVE  Hepatic function panel     Status: Abnormal   Collection Time: 02/10/19  2:04 PM  Result Value Ref Range   Total Protein 7.6 6.0 - 8.5 g/dL   Albumin 4.4 3.8 - 4.8 g/dL    Bilirubin Total 0.6 0.0 - 1.2 mg/dL   Bilirubin, Direct 4.780.21 0.00 - 0.40 mg/dL   Alkaline Phosphatase 76 39 - 117 IU/L   AST 54 (H) 0 - 40 IU/L   ALT 102 (H) 0 - 32 IU/L  Hepatitis panel, acute     Status: None   Collection Time: 02/10/19  2:04 PM  Result Value Ref Range   Hep A IgM Negative Negative   Hepatitis B Surface Ag Negative Negative   Hep B C IgM Negative Negative   Hep C Virus Ab <0.1 0.0 - 0.9 s/co ratio    Comment:                                   Negative:     < 0.8  Indeterminate: 0.8 - 0.9                                   Positive:     > 0.9  The CDC recommends that a positive HCV antibody result  be followed up with a HCV Nucleic Acid Amplification  test (161096).   Urinalysis, Routine w reflex microscopic     Status: None   Collection Time: 02/24/19  3:50 AM  Result Value Ref Range   Color, Urine YELLOW YELLOW   APPearance CLEAR CLEAR   Specific Gravity, Urine 1.011 1.005 - 1.030   pH 6.0 5.0 - 8.0   Glucose, UA NEGATIVE NEGATIVE mg/dL   Hgb urine dipstick NEGATIVE NEGATIVE   Bilirubin Urine NEGATIVE NEGATIVE   Ketones, ur NEGATIVE NEGATIVE mg/dL   Protein, ur NEGATIVE NEGATIVE mg/dL   Nitrite NEGATIVE NEGATIVE   Leukocytes,Ua NEGATIVE NEGATIVE    Comment: Performed at [facility], [address], [place], [place] [address]  CBC with Differential     Status: Abnormal   Collection Time: 02/24/19  9:50 AM  Result Value Ref Range   WBC 9.4 4.0 - 10.5 K/uL   RBC 4.10 3.87 - 5.11 MIL/uL   Hemoglobin 14.4 12.0 - 15.0 g/dL   HCT 98.1 19.1 - 47.8 %   MCV 102.7 (H) 80.0 - 100.0 fL   MCH 35.1 (H) 26.0 - 34.0 pg   MCHC 34.2 30.0 - 36.0 g/dL   RDW 29.5 62.1 - 30.8 %   Platelets 302 150 - 400 K/uL   nRBC 0.0 0.0 - 0.2 %   Neutrophils Relative % 49 %   Neutro Abs 4.6 1.7 - 7.7 K/uL   Lymphocytes Relative 43 %   Lymphs Abs 4.0 0.7 - 4.0 K/uL   Monocytes Relative 6 %   Monocytes Absolute 0.6 0.1 - 1.0 K/uL    Eosinophils Relative 2 %   Eosinophils Absolute 0.2 0.0 - 0.5 K/uL   Basophils Relative 0 %   Basophils Absolute 0.0 0.0 - 0.1 K/uL   Immature Granulocytes 0 %   Abs Immature Granulocytes 0.02 0.00 - 0.07 K/uL    Comment: Performed at [facility], [address] [place], [place] [address]  Comprehensive metabolic panel     Status: Abnormal   Collection Time: 02/24/19  9:50 AM  Result Value Ref Range   Sodium 140 135 - 145 mmol/L   Potassium 3.9 3.5 - 5.1 mmol/L   Chloride 102 98 - 111 mmol/L   CO2 25 22 - 32 mmol/L   Glucose, Bld 123 (H) 70 - 99 mg/dL   BUN 8 6 - 20 mg/dL   Creatinine, Ser 6.96 0.44 - 1.00 mg/dL   Calcium 29.5 8.9 - 28.4 mg/dL   Total Protein 8.7 (H) 6.5 - 8.1 g/dL   Albumin 4.3 3.5 - 5.0 g/dL   AST 42 (H) 15 - 41 U/L   ALT 76 (H) 0 - 44 U/L   Alkaline Phosphatase 71 38 - 126 U/L   Total Bilirubin 0.9 0.3 - 1.2 mg/dL   GFR calc non Af Amer >60 >60 mL/min   GFR calc Af Amer >60 >60 mL/min   Anion gap 13 5 - 15    Comment: Performed at [facility], [address] [place], [place] [address]  Lipase, blood     Status: None   Collection Time: 02/24/19  9:50 AM  Result Value Ref Range   Lipase 30 11 - 51 U/L    Comment: Performed at [facility], [address] [place], [place] [address]  TSH     Status: None   Collection Time: 02/24/19  9:50 AM  Result Value Ref Range   TSH 4.182 0.350 - 4.500 uIU/mL    Comment: Performed by a 3rd Generation assay with a functional sensitivity of <=0.01 uIU/mL. Performed at [facility], [address] [place], [place] [address]      Assessment and Plan: 33 y.o. female with   .Byrd HesselbachMaria was seen today for hospitalization follow-up.  Diagnoses and all orders for this visit:  GAD (generalized anxiety disorder) -     hydrOXYzine (ATARAX/VISTARIL) 10 MG tablet; Take 1-3 tablets (10-30 mg total) by mouth 3 (three) times daily as needed for  anxiety. -     sertraline (ZOLOFT) 25 MG tablet; Take 1 tablet (25 mg total) by mouth at bedtime for 3 days, THEN 2 tablets (50 mg total) at bedtime for 27 days. -     Ambulatory referral to Psychology       Patient education and anticipatory guidance given Patient agrees with treatment plan Follow-up in 1 month or sooner as needed if symptoms worsen or fail to improve  Levonne Hubertharley E. Cummings PA-C

## 2019-03-06 NOTE — Progress Notes (Signed)
  Echocardiogram 2D Echocardiogram has been performed.     Sara Simpson 03/06/2019, 9:51 AM

## 2019-03-06 NOTE — Patient Instructions (Signed)

## 2019-03-07 ENCOUNTER — Other Ambulatory Visit: Payer: Self-pay

## 2019-03-07 ENCOUNTER — Encounter (HOSPITAL_COMMUNITY): Payer: Self-pay

## 2019-03-07 DIAGNOSIS — Z5321 Procedure and treatment not carried out due to patient leaving prior to being seen by health care provider: Secondary | ICD-10-CM | POA: Insufficient documentation

## 2019-03-07 NOTE — ED Triage Notes (Signed)
Pt reports feeling like her heart is racing for the last 30 mins. States that she has seen a cardiologist, who recommended that she go to the ED whenever it happens. She denies drug or alcohol use. She also states that she is working on getting her HTN treated with her PCP.

## 2019-03-08 ENCOUNTER — Emergency Department (HOSPITAL_COMMUNITY)
Admission: EM | Admit: 2019-03-08 | Discharge: 2019-03-08 | Discharge status: Against Medical Advice | Payer: Self-pay | Attending: Emergency Medicine | Admitting: Emergency Medicine

## 2019-03-08 NOTE — ED Notes (Signed)
When going to reassess vitals and draw blood work, pt was not in lobby, outside or in the bathroom.

## 2019-03-09 ENCOUNTER — Ambulatory Visit (INDEPENDENT_AMBULATORY_CARE_PROVIDER_SITE_OTHER): Payer: Self-pay

## 2019-03-09 DIAGNOSIS — R002 Palpitations: Secondary | ICD-10-CM

## 2019-03-11 ENCOUNTER — Telehealth: Payer: Self-pay | Admitting: *Deleted

## 2019-03-11 NOTE — Telephone Encounter (Signed)
Telephone call from pt. States having bursts of palpitations on a daily basis now. She was only having them weekly. She is concerned something is wrong. She states having a lot of anxiety and restarted her Zoloft this Sunday. She did receive the monitor and is wearing it and pushing the button every time she has an episode.She did go to the ED 03/08/19. Stated they did an EKG which was normal and sent her home. Wants to know if there is something else to do. Please advise

## 2019-03-12 NOTE — Telephone Encounter (Signed)
Telephone call to patient. Informed patient to go to ED if palpitations persist and to continue heart monitor so that we can give better advice. Pt agreeable to plan. No further questions.

## 2019-03-12 NOTE — Telephone Encounter (Signed)
For now, she will have to call 911 as soon as it starts. Once we get the monitor back we will be better able to advice.

## 2019-04-01 ENCOUNTER — Encounter: Payer: Self-pay | Attending: Physician Assistant | Admitting: Registered"

## 2019-04-01 ENCOUNTER — Other Ambulatory Visit: Payer: Self-pay

## 2019-04-01 ENCOUNTER — Encounter: Payer: Self-pay | Admitting: Registered"

## 2019-04-01 ENCOUNTER — Other Ambulatory Visit: Payer: Self-pay | Admitting: Cardiology

## 2019-04-01 DIAGNOSIS — R7303 Prediabetes: Secondary | ICD-10-CM | POA: Insufficient documentation

## 2019-04-01 DIAGNOSIS — R002 Palpitations: Secondary | ICD-10-CM

## 2019-04-01 NOTE — Patient Instructions (Addendum)
Instructions/Goals:   Make sure to get in three meals per day. Try to have balanced meals like the My Plate example (see handout). Include lean proteins, vegetables, fruits, and whole grains at meals.   Continue working to have 3 meals per day. Try to spread out the carbohydrates evenly at each meal. Recommend eating something every 3-5 hours while awake and avoiding eating within 3 hours of lying down.   For sweets, want to practice moderation: if currently having one daily may move to one time per week. Recommend trying Fiber One brownies as a sweet snack in moderation.   Water goal: at least 48-64 oz daily. Try to limit any sugar sweetened beverages.   Heart Health:   Include heart healthy unsaturated fats in place of saturated fats.   Limit foods with 20% or more DV for sodium or saturated fat.   Follow balanced plate recommendations  Liver Health:   Avoid high fat foods, especially those high in saturated and trans fats.   Avoid foods high in added sugar.  Continue avoiding alcohol-Great job!   Follow overall balanced diet discussed.   Make physical activity a part of your week. Try to include at least 30 minutes of physical activity 5 days each week or at least 150 minutes per week. Regular physical activity promotes overall health-including helping to reduce risk for heart disease and diabetes, promoting mental health, and helping [place] sleep better.

## 2019-04-01 NOTE — Progress Notes (Signed)
Medical Nutrition Therapy:  Appt start time: 1003 end time:  1103.  Assessment:  Primary concerns today: Pt referred due to prediabetes. Pt present for appointment alone. Pt reports she would like information regarding best diet for prediabetes and elevated liver enzymes. Reports she is also working to lose weight. Pt reports she was including sweets/baked goods once daily. Would like to know how often she should limit these foods to have them in moderation. Pt reports since learning she has prediabetes and elevated liver enzymes she has stopped drinking alcohol. Reports since she stopped drinking alcohol she has started wanting sweets more than usual.   Food Allergies/Intolerances: None reported.   GI Concerns: Occasional reflux.   Pertinent Lab Values: 01/26/2019 Hgb A1c: 6.0  Weight Hx: N/A  Preferred Learning Style:   No preference indicated   Learning Readiness:   Ready  MEDICATIONS: See list. Reviewed.    DIETARY INTAKE:  Usual eating pattern includes 2 meals and multiple snacks per day. Reports she often grazes during the day. Pt snacks during the day and then has a larger dinner.   Common foods: potatoes or rice with a protein (chicken or fish common) and vegetables sometimes.  Avoided foods: greasy/fried foods, pizza, burgers; milk, alcohol.    Typical Snacks: Caprese sandwich (toast with avocado, tomato, smoked salmon, spinach); salad; Activia yogurt; banana; seafood (sushi, etc).    Typical Beverages: camomile tea/herbal teas sweetened with stevia, sometimes honey; water; occasionally juice mixed with water. ~2-3 glasses water (32-48 oz estimated).   Location of Meals: dining room table or living room.   Electronics Present at [employer]: sometimes but not often.   24-hr recall:  B ( AM): Activia yogurt, banana Snk ( AM): None reported.  L ( PM): 1 piece Lox toast (whole grain bread, arugula, spinach, cream cheese, salmon, avocado), herbal tea or water  Snk ( PM):  Harvest Snaps lightly salted  D ( PM): lamb chops, asparagus, mashed potatoes, mango juice mixed with water  Snk ( PM): None reported.  Beverages: water and juice mixed; herbal tea  Usual physical activity: walking   Minutes/Week: 30-40 minutes x 3-5 days.   Progress Towards Goal(s):  In progress.   Nutritional Diagnosis:  NB-1.1 Food and nutrition-related knowledge deficit As related to nutrition for prediabetes and elevated liver enzymes .  As evidenced by no prior nutrition education by a dietitian reported; pt has questions regarding how to eat for recent dx of prediabetes and elevated liver enzymes .    Intervention:  Nutrition counseling provided. Dietitian discussed pt's hgbA1c and provided education on nutrition therapy for prediabetes, heart and liver health. Provided education on benefits of physical activity on blood sugar and heart health. Discussed moderation with sweets such as having them 1 time a week rather than daily. Discussed more beneficial "sweets" such as trying Fiber one brownies. Discussed importance of focusing on healthy habits (nutrition and regular physical activities) rather than focusing on weight. Congratulated pt on abstinence from alcohol since August. Pt appeared agreeable to information/goals discussed.   Instructions/Goals:   Make sure to get in three meals per day. Try to have balanced meals like the My Plate example (see handout). Include lean proteins, vegetables, fruits, and whole grains at meals.   Continue working to have 3 meals per day. Try to spread out the carbohydrates evenly at each meal. Recommend eating something every 3-5 hours while awake and avoiding eating within 3 hours of lying down.   For sweets, want to practice moderation:  if currently having one daily may move to one time per week. Recommend trying Fiber One brownies as a sweet snack in moderation.   Water goal: at least 48-64 oz daily. Try to limit any sugar sweetened beverages.    Heart Health:   Include heart healthy unsaturated fats in place of saturated fats.   Limit foods with 20% or more DV for sodium or saturated fat.   Follow balanced plate recommendations  Liver Health:   Avoid high fat foods, especially those high in saturated and trans fats.   Avoid foods high in added sugar.  Continue avoiding alcohol-Great job!   Follow overall balanced diet discussed.   Make physical activity a part of your week. Try to include at least 30 minutes of physical activity 5 days each week or at least 150 minutes per week. Regular physical activity promotes overall health-including helping to reduce risk for heart disease and diabetes, promoting mental health, and helping [place] sleep better.    Teaching Method Utilized:  Visual Auditory  Handouts given during visit include:  Balanced plate and food list.   Nutrition for NAFLD  Barriers to learning/adherence to lifestyle change: None reported.   Demonstrated degree of understanding via:  Teach Back   Monitoring/Evaluation:  Dietary intake, exercise, and body weight prn. Contact information provided.

## 2019-04-02 ENCOUNTER — Telehealth: Payer: Self-pay | Admitting: Cardiology

## 2019-04-02 NOTE — Telephone Encounter (Signed)
Patient would like a call regarding monitor results

## 2019-04-03 NOTE — Telephone Encounter (Signed)
I called patient on her [phone]. Her mailbox is full and I could not leave a message. Thank you.

## 2019-04-03 NOTE — Telephone Encounter (Signed)
Pt calling for monitor results  

## 2019-04-07 NOTE — Telephone Encounter (Signed)
Notes recorded by Berniece Salines, DO on 04/06/2019 at 11:50 AM EDT  I did speak with patient on Friday(04/03/2019) about her monitor results. All her questions were answered. She will follow-up as planned.

## 2019-05-29 ENCOUNTER — Other Ambulatory Visit: Payer: Self-pay

## 2019-05-29 ENCOUNTER — Encounter: Payer: Self-pay | Admitting: Physician Assistant

## 2019-05-29 ENCOUNTER — Ambulatory Visit (INDEPENDENT_AMBULATORY_CARE_PROVIDER_SITE_OTHER): Payer: HRSA Program | Admitting: Physician Assistant

## 2019-05-29 VITALS — Temp 97.6°F | Ht 65.0 in | Wt 233.0 lb

## 2019-05-29 DIAGNOSIS — U071 COVID-19: Secondary | ICD-10-CM | POA: Diagnosis not present

## 2019-05-29 MED ORDER — ALBUTEROL SULFATE HFA 108 (90 BASE) MCG/ACT IN AERS: 1.0000 | INHALATION_SPRAY | RESPIRATORY_TRACT | 0 refills | Status: DC | PRN

## 2019-05-29 NOTE — Progress Notes (Deleted)
   Subjective:    Patient ID: Sara Simpson, female    DOB: Aug 20, 1985, 33 y.o.   MRN: [id number]  HPI  Monday- slight runny nose/today sense of taste  Work 18th of November last  Tested positive for Covid Wednesday Was tested because of positive contact Feels normal - slight runny nose  December 11th on Any symptoms.    Review of Systems     Objective:   Physical Exam        Assessment & Plan:

## 2019-05-29 NOTE — Progress Notes (Deleted)
Tested positive for Covid Wednesday Was tested because of positive contact Feels normal - slight runny nose

## 2019-06-01 NOTE — Progress Notes (Signed)
Patient ID: Sara Simpson, female   DOB: [birth year], 33 y.o.   MRN: [id number] .Marland KitchenVirtual Visit via Video Note  I connected with Sara Simpson on 05/29/2019 at 11:30 AM EST by a video enabled telemedicine application and verified that I am speaking with the correct person using two identifiers.  Location: Patient: home Provider: clinic   I discussed the limitations of evaluation and management by telemedicine and the availability of in person appointments. The patient expressed understanding and agreed to proceed.  History of Present Illness: Patient is a 33 year old female who calls into the clinic after testing positive for COVID-19. Her symptoms started on the 18th.  Patient has only had a slight runny nose a loss of taste.  She only got tested because of a direct contact.  She denies any shortness of breath, cough, body aches.  She does feel little tired.  She wonders about how long she should quarantine.  .. Active Ambulatory Problems    Diagnosis Date Noted  . Transaminitis 01/28/2019  . NAFL (nonalcoholic fatty liver) 84/16/6063  . VPC's (ventricular premature complexes) 02/01/2019  . Hypertension goal BP (blood pressure) < 130/80 02/01/2019  . Prediabetes 02/01/2019  . 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 No Resolved Ambulatory Problems   Past Medical History:  Diagnosis Date  . Anxiety   . Asthma   . Eczema   . History of anemia   . Hypertension   . Palpitations    Reviewed med, allergy, problem list.      Observations/Objective: No acute distress Normal appearance and mood.  No cough, wheezing, labored breathing.   .. Today's Vitals   05/29/19 0915  Temp: 97.6 F (36.4 C)  TempSrc: Oral  Weight: 233 lb (105.7 kg)  Height: 5\' 5"  (1.651 m)   Body mass index is 38.77 kg/m.    Assessment and Plan: Marland KitchenMarland KitchenKarin was seen today for advice only.  Diagnoses and all orders for this visit:  COVID-19  virus infection  Other orders -     albuterol (VENTOLIN HFA) 108 (90 Base) MCG/ACT inhaler; Inhale 1-2 puffs into the lungs every 4 (four) hours as needed for wheezing or shortness of breath.   Discuss current concerns and questions about COVID-19 infection.  Discussed importance of self quarantine even though she feels like her symptoms are not severe.  She may go back to work on dec 11th 10 days after symptoms started.  We can provide her a work note if needed.  Certainly symptoms can worsen throughout the [employer].  Discussed typical symptoms.  Since it is the weekend I went ahead and set her albuterol inhaler just in case she needs this.  If she were to have sudden worsening breathing or shortness of breath go to emergency room.   Follow Up Instructions:    I discussed the assessment and treatment plan with the patient. The patient was provided an opportunity to ask questions and all were answered. The patient agreed with the plan and demonstrated an understanding of the instructions.   The patient was advised to call back or seek an in-person evaluation if the symptoms worsen or if the condition fails to improve as anticipated.    Iran Planas, PA-C

## 2019-06-02 ENCOUNTER — Encounter: Payer: Self-pay | Admitting: Cardiology

## 2019-06-02 ENCOUNTER — Telehealth (INDEPENDENT_AMBULATORY_CARE_PROVIDER_SITE_OTHER): Payer: Self-pay | Admitting: Cardiology

## 2019-06-02 ENCOUNTER — Other Ambulatory Visit: Payer: Self-pay

## 2019-06-02 VITALS — BP 131/89 | HR 79 | Ht 65.0 in | Wt 231.0 lb

## 2019-06-02 DIAGNOSIS — I471 Supraventricular tachycardia, unspecified: Secondary | ICD-10-CM

## 2019-06-02 DIAGNOSIS — E782 Mixed hyperlipidemia: Secondary | ICD-10-CM

## 2019-06-02 NOTE — Progress Notes (Signed)
Telehealth Visit  Date:  06/02/2019   ID:  Sara Simpson, DOB 04/09/1986, MRN [id number]   The patient is at home.  I am in the office.   PCP:  Trixie Dredge, PA-C  Cardiologist:  Berniece Salines, DO  Electrophysiologist:  None   Evaluation Performed:  Follow up visit  Chief Complaint:  Follow up  History of Present Illness:    Sara Simpson is a 32 y.o. female with anxiety, asthma who was evaluated on March 03, 2019 for worsening palpitations.  During her visit I recommended that the patient undergo ZIO monitor as well as a transthoracic echocardiogram.  Also advised that she continue to take her antianxiety medication as I did suspect that anxiety may be playing a role as well.  Interim she was able to undergo her testing and at this time were going to a telehealth video call for her results.   The patient does have symptoms concerning for COVID-19 infection and has been tested positive.    Past Medical History:  Diagnosis Date  . Anxiety   . Asthma   . Eczema   . History of anemia   . Hypertension   . Palpitations   . Transaminitis    Past Surgical History:  Procedure Laterality Date  . TUBAL LIGATION       Current Meds  Medication Sig  . hydrOXYzine (ATARAX/VISTARIL) 10 MG tablet Take 1-3 tablets (10-30 mg total) by mouth 3 (three) times daily as needed for anxiety.  . sertraline (ZOLOFT) 25 MG tablet Take 1 tablet (25 mg total) by mouth at bedtime for 3 days, THEN 2 tablets (50 mg total) at bedtime for 27 days. (Patient taking differently: 1 tablet daily)     Allergies:   Penicillins and Fluoxetine   Social History   Tobacco Use  . Smoking status: Never Smoker  . Smokeless tobacco: Never Used  Substance Use Topics  . Alcohol use: Yes    Alcohol/week: 4.0 standard drinks    Types: 4 Standard drinks or equivalent per week  . Drug use: Never     Family Hx: The patient's family history includes Asthma in her son; Cancer in her maternal  grandfather; Diabetes in her maternal aunt, maternal grandfather, maternal grandmother, and mother; Heart Problems in her maternal aunt; Heart attack in her maternal grandfather; Thyroid disease in her sister.  ROS:   Review of Systems  Constitution: Negative for decreased appetite, fever and weight gain.  HENT: Negative for congestion, ear discharge, hoarse voice and sore throat.   Eyes: Negative for discharge, redness, vision loss in right eye and visual halos.  Cardiovascular: Negative for chest pain, dyspnea on exertion, leg swelling, orthopnea and palpitations.  Respiratory: Reports congestion.  Negative for cough, hemoptysis, shortness of breath and snoring.   Endocrine: Negative for heat intolerance and polyphagia.  Hematologic/Lymphatic: Negative for bleeding problem. Does not bruise/bleed easily.  Skin: Negative for flushing, nail changes, rash and suspicious lesions.  Musculoskeletal: Negative for arthritis, joint pain, muscle cramps, myalgias, neck pain and stiffness.  Gastrointestinal: Negative for abdominal pain, bowel incontinence, diarrhea and excessive appetite.  Genitourinary: Negative for decreased libido, genital sores and incomplete emptying.  Neurological: Negative for brief paralysis, focal weakness, headaches and loss of balance.  Psychiatric/Behavioral: Negative for altered mental status, depression and suicidal ideas.  Allergic/Immunologic: Negative for HIV exposure and persistent infections.    Prior CV studies:   The following studies were reviewed today:  ZIO monitor 04/01/2019. The patient wore the monitor for  10 days. Indication: Palpitations  The minimum HR of 47 bpm with a maximum HR of 180 bpm, and average HR of 72 bpm.  Predominant underlying rhythm was Sinus Rhythm.  1 run of Supraventricular Tachycardia occurred lasting 7 beats with a max rate of 132 bpm (avg 115 bpm).  This run was not associated with a patient triggered event. All patient triggered  events were associated with sinus rhythm with heart ranging from 70-121 bpm. Isolated Premature atrial complex was rare (<1.0%).  Isolated Premature ventricular complex was rare (<1.0%). No Pause, No AV block and No Atrial fibrillation.  Of note the inverted QRS complexes possibly due to inverted placement of device.  Transthoracic echocardiogram 03/06/2019  1. The left ventricle has normal systolic function with an ejection fraction of 60-65%. The cavity size was normal. Left ventricular diastolic parameters were normal. No evidence of left ventricular regional wall motion abnormalities.  2. The right ventricle has normal systolic function. The cavity was normal. There is no increase in right ventricular wall thickness.  3. No evidence of mitral valve stenosis.  4. The aortic valve is tricuspid. No stenosis of the aortic valve.  5. The aorta is normal unless otherwise noted.  6. The aortic root, ascending aorta, aortic arch and descending aorta are normal in size and structure.  Labs/Other Tests and Data Reviewed:    EKG: None today  Recent Labs: 02/24/2019: ALT 76; BUN 8; Creatinine, Ser 0.62; Hemoglobin 14.4; Platelets 302; Potassium 3.9; Sodium 140; TSH 4.182   Recent Lipid Panel Lab Results  Component Value Date/Time   CHOL 204 (H) 01/26/2019 04:18 PM   TRIG 87 01/26/2019 04:18 PM   HDL 65 01/26/2019 04:18 PM   CHOLHDL 3.1 01/26/2019 04:18 PM   LDLCALC 120 (H) 01/26/2019 04:18 PM    Wt Readings from Last 3 Encounters:  06/02/19 231 lb (104.8 kg)  05/29/19 233 lb (105.7 kg)  04/01/19 243 lb 9.6 oz (110.5 kg)     Objective:    Vital Signs:  BP 131/89 (BP Location: Left Arm, Patient Position: Sitting)   Pulse 79   Ht 5\' 5"  (1.651 m)   Wt 231 lb (104.8 kg)   BMI 38.44 kg/m    Unable to obtain physical exam.  ASSESSMENT & PLAN:    1. Paroxysmal supraventricular tachycardia-1 run seen on her ZIO monitor which was asymptomatic.  Also discussed this with the patient.  At  this time there is no need to start any medication as her symptoms of palpitation also has resolved since being treated ultimately for her anxiety.  She is in agreement with this plan she will follow with me as needed.  2.  I was very happy about the patient that she has lost 30 pounds since our last visit.  She has adopted the lifestyle she tells me.   COVID-19 Education: The signs and symptoms of COVID-19 were discussed with the patient and how to seek care for testing (follow up with PCP or arrange E-visit).  The importance of social distancing was discussed today.  Time:   Today, I have spent 15 minutes with the patient with telehealth technology discussing the above problems.     Medication Adjustments/Labs and Tests Ordered: Current medicines are reviewed at length with the patient today.  Concerns regarding medicines are outlined above.   Tests Ordered: No orders of the defined types were placed in this encounter.   Medication Changes: No orders of the defined types were placed in this encounter.  Follow Up: As needed  Signed, Thomasene RippleKardie Harumi Yamin, DO  06/02/2019 2:56 PM    [facility]

## 2019-06-02 NOTE — Patient Instructions (Signed)
Medication Instructions:  Your physician recommends that you continue on your current medications as directed. Please refer to the Current Medication list given to you today.  *If you need a refill on your cardiac medications before your next appointment, please call your pharmacy*  Lab Work: None If you have labs (blood work) drawn today and your tests are completely normal, you will receive your results only by: Marland Kitchen MyChart Message (if you have MyChart) OR . A paper copy in the mail If you have any lab test that is abnormal or we need to change your treatment, we will call you to review the results.  Testing/Procedures: nOne  Follow-Up: At [facility], you and your health needs are our priority.  As part of our continuing mission to provide you with exceptional heart care, we have created designated Provider Care Teams.  These Care Teams include your primary Cardiologist (physician) and Advanced Practice Providers (APPs -  Physician Assistants and Nurse Practitioners) who all work together to provide you with the care you need, when you need it.  Your next appointment:    As needed  The format for your next appointment:   In Person  Provider:   Berniece Salines, DO  Other Instructions

## 2019-07-02 ENCOUNTER — Other Ambulatory Visit: Payer: Self-pay | Admitting: Physician Assistant

## 2019-07-02 DIAGNOSIS — F411 Generalized anxiety disorder: Secondary | ICD-10-CM

## 2019-09-01 ENCOUNTER — Encounter: Payer: Self-pay | Admitting: Medical-Surgical

## 2019-09-08 ENCOUNTER — Other Ambulatory Visit: Payer: Self-pay | Admitting: Physician Assistant

## 2019-09-08 DIAGNOSIS — F411 Generalized anxiety disorder: Secondary | ICD-10-CM

## 2019-09-18 ENCOUNTER — Other Ambulatory Visit: Payer: Self-pay

## 2019-09-18 ENCOUNTER — Ambulatory Visit (INDEPENDENT_AMBULATORY_CARE_PROVIDER_SITE_OTHER): Payer: Self-pay | Admitting: Family Medicine

## 2019-09-18 ENCOUNTER — Encounter: Payer: Self-pay | Admitting: Family Medicine

## 2019-09-18 VITALS — BP 131/85 | HR 71 | Ht 65.0 in | Wt 249.0 lb

## 2019-09-18 DIAGNOSIS — L232 Allergic contact dermatitis due to cosmetics: Secondary | ICD-10-CM

## 2019-09-18 DIAGNOSIS — R7303 Prediabetes: Secondary | ICD-10-CM

## 2019-09-18 DIAGNOSIS — K760 Fatty (change of) liver, not elsewhere classified: Secondary | ICD-10-CM

## 2019-09-18 DIAGNOSIS — L239 Allergic contact dermatitis, unspecified cause: Secondary | ICD-10-CM | POA: Insufficient documentation

## 2019-09-18 NOTE — Patient Instructions (Signed)
If you have 10mg  prednisone tabs at home- take 20mg  twice per day for 5 days.  Continue cetirizine.  You can take this twice per day.  Pepcid may also be helpful.  You may use cortisone on ras in the neck area.     Contact Dermatitis Dermatitis is redness, soreness, and swelling (inflammation) of the skin. Contact dermatitis is a reaction to something that touches the skin. There are two types of contact dermatitis:  Irritant contact dermatitis. This happens when something bothers (irritates) your skin, like soap.  Allergic contact dermatitis. This is caused when you are exposed to something that you are allergic to, such as poison ivy. What are the causes?  Common causes of irritant contact dermatitis include: ? Makeup. ? Soaps. ? Detergents. ? Bleaches. ? Acids. ? Metals, such as nickel.  Common causes of allergic contact dermatitis include: ? Plants. ? Chemicals. ? Jewelry. ? Latex. ? Medicines. ? Preservatives in products, such as clothing. What increases the risk?  Having a job that exposes you to things that bother your skin.  Having asthma or eczema. What are the signs or symptoms? Symptoms may happen anywhere the irritant has touched your skin. Symptoms include:  Dry or flaky skin.  Redness.  Cracks.  Itching.  Pain or a burning feeling.  Blisters.  Blood or clear fluid draining from skin cracks. With allergic contact dermatitis, swelling may occur. This may happen in places such as the eyelids, mouth, or genitals. How is this treated?  This condition is treated by checking for the cause of the reaction and protecting your skin. Treatment may also include: ? Steroid creams, ointments, or medicines. ? Antibiotic medicines or other ointments, if you have a skin infection. ? Lotion or medicines to help with itching. ? A bandage (dressing). Follow these instructions at home: Skin care  Moisturize your skin as needed.  Put cool cloths on your  skin.  Put a baking soda paste on your skin. Stir water into baking soda until it looks like a paste.  Do not scratch your skin.  Avoid having things rub up against your skin.  Avoid the use of soaps, perfumes, and dyes. Medicines  Take or apply over-the-counter and prescription medicines only as told by your doctor.  If you were prescribed an antibiotic medicine, take or apply it as told by your doctor. Do not stop using it even if your condition starts to get better. Bathing  Take a bath with: ? Epsom salts. ? Baking soda. ? Colloidal oatmeal.  Bathe less often.  Bathe in warm water. Avoid using hot water. Bandage care  If you were given a bandage, change it as told by your health care provider.  Wash your hands with soap and water before and after you change your bandage. If soap and water are not available, use hand sanitizer. General instructions  Avoid the things that caused your reaction. If you do not know what caused it, keep a journal. Write down: ? What you eat. ? What skin products you use. ? What you drink. ? What you wear in the area that has symptoms. This includes jewelry.  Check the affected areas every day for signs of infection. Check for: ? More redness, swelling, or pain. ? More fluid or blood. ? Warmth. ? Pus or a bad smell.  Keep all follow-up visits as told by your doctor. This is important. Contact a doctor if:  You do not get better with treatment.  Your condition gets  worse.  You have signs of infection, such as: ? More swelling. ? Tenderness. ? More redness. ? Soreness. ? Warmth.  You have a fever.  You have new symptoms. Get help right away if:  You have a very bad headache.  You have neck pain.  Your neck is stiff.  You throw up (vomit).  You feel very sleepy.  You see red streaks coming from the area.  Your bone or joint near the area hurts after the skin has healed.  The area turns darker.  You have trouble  breathing. Summary  Dermatitis is redness, soreness, and swelling of the skin.  Symptoms may occur where the irritant has touched you.  Treatment may include medicines and skin care.  If you do not know what caused your reaction, keep a journal.  Contact a doctor if your condition gets worse or you have signs of infection. This information is not intended to replace advice given to you by your health care provider. Make sure you discuss any questions you have with your health care provider. Document Revised: 10/01/2018 Document Reviewed: 12/25/2017 Elsevier Patient Education  2020 [employer].

## 2019-09-18 NOTE — Assessment & Plan Note (Signed)
Discussed avoidance of cosmetics as well as latex Continue cetirizine, may increase to BID.  Can add pepcid as well She has some prednisone 10mg  tabs at home.  Given instructions on how to use this.   She may use topical cortisone to neck.  Instructed to call for new or worsening symptoms.  She should seek emergency care for difficulty breathing or signs of anaphylaxis.  She expresses undertanding

## 2019-09-18 NOTE — Progress Notes (Signed)
Sara Simpson - 34 y.o. female MRN [id number]  Date of birth: 1985-10-16  Subjective Chief Complaint  Patient presents with  . Rash    HPI Sara Simpson is a 34 y.o. female here today with complaint of rash.  She reports that she used a new make up product yesterday and noticed puffiness and redness around her eyes with itching and itching on her neck.  Make up product was special effects makeup and she did also use some latex products with this.   She has taken cetirizine with some improvement.  She denies chest tightness, difficulty breathing or vision changes.   She would also like to have updated labs for NAFLD and prediabetes today.   ROS:  A comprehensive ROS was completed and negative except as noted per HPI  Allergies  Allergen Reactions  . Penicillins Other (See Comments)    Unknown, childhood reaction  . Fluoxetine Other (See Comments)    Past Medical History:  Diagnosis Date  . Anxiety   . Asthma   . Eczema   . History of anemia   . Hypertension   . Palpitations   . Transaminitis     Past Surgical History:  Procedure Laterality Date  . TUBAL LIGATION      Social History   Socioeconomic History  . Marital status: Single    Spouse name: Not on file  . Number of children: Not on file  . Years of education: Not on file  . Highest education level: Not on file  Occupational History  . Not on file  Tobacco Use  . Smoking status: Never Smoker  . Smokeless tobacco: Never Used  Substance and Sexual Activity  . Alcohol use: Yes    Alcohol/week: 4.0 standard drinks    Types: 4 Standard drinks or equivalent per week  . Drug use: Never  . Sexual activity: Not Currently    Birth control/protection: Surgical    Comment: tubal  Other Topics Concern  . Not on file  Social History Narrative  . Not on file   Social Determinants of Health   Financial Resource Strain:   . Difficulty of Paying Living Expenses:   Food Insecurity:   . Worried About Ship broker in the Last Year:   . Arboriculturist in the Last Year:   Transportation Needs:   . Film/video editor (Medical):   Marland Kitchen Lack of Transportation (Non-Medical):   Physical Activity:   . Days of Exercise per Week:   . Minutes of Exercise per Session:   Stress:   . Feeling of Stress :   Social Connections:   . Frequency of Communication with Friends and Family:   . Frequency of Social Gatherings with Friends and Family:   . Attends Religious Services:   . Active Member of Clubs or Organizations:   . Attends Archivist Meetings:   Marland Kitchen Marital Status:     Family History  Problem Relation Age of Onset  . Diabetes Mother   . Thyroid disease Sister   . Diabetes Maternal Aunt   . Heart Problems Maternal Aunt   . Diabetes Maternal Grandmother   . Heart attack Maternal Grandfather   . Diabetes Maternal Grandfather   . Cancer Maternal Grandfather   . Asthma Son     Health Maintenance  Topic Date Due  . PAP SMEAR-Modifier  09/18/2019 (Originally 11/07/2005)  . INFLUENZA VACCINE  09/23/2019 (Originally 01/24/2019)  . TETANUS/TDAP  02/01/2020 (Originally 11/08/2003)  .  HIV Screening  Discontinued     ----------------------------------------------------------------------------------------------------------------------------------------------------------------------------------------------------------------- Physical Exam BP 131/85   Pulse 71   Ht 5\' 5"  (1.651 m)   Wt 249 lb (112.9 kg)   BMI 41.44 kg/m   Physical Exam Constitutional:      Appearance: Normal appearance.  HENT:     Head: Normocephalic and atraumatic.  Eyes:     General: No scleral icterus.    Pupils: Pupils are equal, round, and reactive to light.  Cardiovascular:     Rate and Rhythm: Normal rate and regular rhythm.  Pulmonary:     Effort: Pulmonary effort is normal.     Breath sounds: Normal breath sounds. No wheezing.  Musculoskeletal:     Cervical back: Neck supple.  Skin:    Comments:  Rash on neck that is red and scaly appearing.  Mild periorbital edema and erythema around orbits and lids.    Neurological:     General: No focal deficit present.     Mental Status: She is alert.  Psychiatric:        Mood and Affect: Mood normal.        Behavior: Behavior normal.     ------------------------------------------------------------------------------------------------------------------------------------------------------------------------------------------------------------------- Assessment and Plan  NAFL (nonalcoholic fatty liver) Update LFT's   Prediabetes Update a1c   Allergic contact dermatitis Discussed avoidance of cosmetics as well as latex Continue cetirizine, may increase to BID.  Can add pepcid as well She has some prednisone 10mg  tabs at home.  Given instructions on how to use this.   She may use topical cortisone to neck.  Instructed to call for new or worsening symptoms.  She should seek emergency care for difficulty breathing or signs of anaphylaxis.  She expresses undertanding   No orders of the defined types were placed in this encounter.   No follow-ups on file.    This visit occurred during the SARS-CoV-2 public health emergency.  Safety protocols were in place, including screening questions prior to the visit, additional usage of staff PPE, and extensive cleaning of exam room while observing appropriate contact time as indicated for disinfecting solutions.

## 2019-09-18 NOTE — Assessment & Plan Note (Signed)
Update LFT's 

## 2019-09-18 NOTE — Assessment & Plan Note (Signed)
Update a1c 

## 2019-09-19 LAB — COMPLETE METABOLIC PANEL WITH GFR
AG Ratio: 1.2 (calc) (ref 1.0–2.5)
ALT: 13 U/L (ref 6–29)
AST: 16 U/L (ref 10–30)
Albumin: 4.1 g/dL (ref 3.6–5.1)
Alkaline phosphatase (APISO): 70 U/L (ref 31–125)
BUN: 12 mg/dL (ref 7–25)
CO2: 26 mmol/L (ref 20–32)
Calcium: 9.6 mg/dL (ref 8.6–10.2)
Chloride: 102 mmol/L (ref 98–110)
Creat: 0.58 mg/dL (ref 0.50–1.10)
GFR, Est African American: 139 mL/min/{1.73_m2} (ref >=60)
GFR, Est Non African American: 120 mL/min/{1.73_m2} (ref >=60)
Globulin: 3.4 g/dL (calc) (ref 1.9–3.7)
Glucose, Bld: 110 mg/dL — ABNORMAL HIGH (ref 65–99)
Potassium: 4.3 mmol/L (ref 3.5–5.3)
Sodium: 138 mmol/L (ref 135–146)
Total Bilirubin: 0.6 mg/dL (ref 0.2–1.2)
Total Protein: 7.5 g/dL (ref 6.1–8.1)

## 2019-09-19 LAB — HEMOGLOBIN A1C
Hgb A1c MFr Bld: 5.5 % of total Hgb (ref <5.7)
Mean Plasma Glucose: 111 (calc)
eAG (mmol/L): 6.2 (calc)

## 2019-09-20 ENCOUNTER — Other Ambulatory Visit: Payer: Self-pay | Admitting: Osteopathic Medicine

## 2019-09-20 DIAGNOSIS — F411 Generalized anxiety disorder: Secondary | ICD-10-CM

## 2019-09-21 ENCOUNTER — Other Ambulatory Visit: Payer: Self-pay

## 2019-09-21 DIAGNOSIS — F411 Generalized anxiety disorder: Secondary | ICD-10-CM

## 2019-09-21 MED ORDER — SERTRALINE HCL 25 MG PO TABS: 50.0000 mg | ORAL_TABLET | Freq: Every day | ORAL | 0 refills | Status: DC

## 2019-09-21 NOTE — Telephone Encounter (Signed)
Cam states she lost the prescription for Zoloft. Refill sent. Also scheduled her for a new patient appointment with Joy.

## 2019-09-23 ENCOUNTER — Encounter: Payer: Self-pay | Admitting: Family Medicine

## 2019-09-28 ENCOUNTER — Ambulatory Visit (INDEPENDENT_AMBULATORY_CARE_PROVIDER_SITE_OTHER): Payer: Self-pay | Admitting: Medical-Surgical

## 2019-09-28 ENCOUNTER — Other Ambulatory Visit: Payer: Self-pay

## 2019-09-28 ENCOUNTER — Encounter: Payer: Self-pay | Admitting: Medical-Surgical

## 2019-09-28 VITALS — BP 123/80 | HR 84 | Temp 98.3°F | Ht 63.75 in | Wt 248.0 lb

## 2019-09-28 DIAGNOSIS — R062 Wheezing: Secondary | ICD-10-CM

## 2019-09-28 DIAGNOSIS — J302 Other seasonal allergic rhinitis: Secondary | ICD-10-CM

## 2019-09-28 DIAGNOSIS — F411 Generalized anxiety disorder: Secondary | ICD-10-CM

## 2019-09-28 MED ORDER — SERTRALINE HCL 25 MG PO TABS: 25.0000 mg | ORAL_TABLET | Freq: Every day | ORAL | 2 refills | Status: DC

## 2019-09-28 MED ORDER — IPRATROPIUM BROMIDE 0.03 % NA SOLN: 2.0000 | Freq: Two times a day (BID) | NASAL | 2 refills | Status: DC

## 2019-09-28 MED ORDER — ALBUTEROL SULFATE HFA 108 (90 BASE) MCG/ACT IN AERS: 2.0000 | INHALATION_SPRAY | Freq: Four times a day (QID) | RESPIRATORY_TRACT | 5 refills | Status: DC | PRN

## 2019-09-28 NOTE — Progress Notes (Signed)
Subjective:    CC: seasonal allergies, wheezing, anxiety follow up  HPI: Pleasant 34 year old patient here to establish care with a new PCP.  Also would like to discuss seasonal allergies, wheezing, and anxiety.  Seasonal allergies-allergic to mold, dust, and some fragrances.  Taking Zyrtec every other night.  Reports this medication leaves her sleepy during the day so she does not take it every night  Previously tried to Claritin and Allegra but does not remember if these were helpful.  Severe symptoms started 1 week ago with rhinorrhea, postnasal drip, itchy/scratchy/burning throat, and nasal congestion.  Started taking Flonase today.  Wheezing-reports childhood asthma that had resolved in adulthood.  Has noticed intermittent wheezing over the last week along with dyspnea on exertion.  Using her son's albuterol inhaler approximately once daily with good relief of symptoms.  Anxiety/panic attack-was started on Zoloft near the end of last year for heart palpitations/panic attacks.  She is taking 25 mg nightly with good relief of symptoms.  Denies SI/HI.  Was prescribed a refill on 09/21/2019 for 60 tabs.  Reports that she needs another refill of this medication because she lost her whole prescription and has been unable to find it.  I reviewed the past medical history, family history, social history, surgical history, and allergies today and no changes were needed.  Please see the problem list section below in epic for further details.  Past Medical History: Past Medical History:  Diagnosis Date  . Anxiety   . Asthma   . Eczema   . History of anemia   . Hypertension   . Palpitations   . Transaminitis    Past Surgical History: Past Surgical History:  Procedure Laterality Date  . TUBAL LIGATION     Social History: Social History   Socioeconomic History  . Marital status: Single    Spouse name: Not on file  . Number of children: Not on file  . Years of education: Not on file  .  Highest education level: Not on file  Occupational History  . Not on file  Tobacco Use  . Smoking status: Never Smoker  . Smokeless tobacco: Never Used  Substance and Sexual Activity  . Alcohol use: Not Currently  . Drug use: Never  . Sexual activity: Not Currently    Birth control/protection: Surgical    Comment: tubal  Other Topics Concern  . Not on file  Social History Narrative  . Not on file   Social Determinants of Health   Financial Resource Strain:   . Difficulty of Paying Living Expenses:   Food Insecurity:   . Worried About Charity fundraiser in the Last Year:   . Arboriculturist in the Last Year:   Transportation Needs:   . Film/video editor (Medical):   Marland Kitchen Lack of Transportation (Non-Medical):   Physical Activity:   . Days of Exercise per Week:   . Minutes of Exercise per Session:   Stress:   . Feeling of Stress :   Social Connections:   . Frequency of Communication with Friends and Family:   . Frequency of Social Gatherings with Friends and Family:   . Attends Religious Services:   . Active Member of Clubs or Organizations:   . Attends Archivist Meetings:   Marland Kitchen Marital Status:    Family History: Family History  Problem Relation Age of Onset  . Diabetes Mother   . Thyroid disease Sister   . Diabetes Maternal Aunt   . Heart  Problems Maternal Aunt   . Diabetes Maternal Grandmother   . Heart attack Maternal Grandfather   . Diabetes Maternal Grandfather   . Cancer Maternal Grandfather   . Asthma Son    Allergies: Allergies  Allergen Reactions  . Penicillins Other (See Comments)    Unknown, childhood reaction  . Fluoxetine Other (See Comments)   Medications: See med rec.  Review of Systems: No fevers, chills, night sweats, weight loss, chest pain, or shortness of breath.   Objective:    General: Well Developed, well nourished, and in no acute distress.  Neuro: Alert and oriented x3.  HEENT: Normocephalic, atraumatic.  Skin:  Warm and dry. Cardiac: Regular rate and rhythm, no murmurs rubs or gallops, no lower extremity edema.  Respiratory: Clear to auscultation bilaterally. Not using accessory muscles, speaking in full sentences.   Impression and Recommendations:    1. Wheezing Albuterol inhaler 2 puffs every 6 hours as needed for wheezing.  Suspect exacerbated by severe seasonal allergies. - albuterol (VENTOLIN HFA) 108 (90 Base) MCG/ACT inhaler; Inhale 2 puffs into the lungs every 6 (six) hours as needed for wheezing.  Dispense: 6.7 g; Refill: 5  2. Seasonal allergies Discussed switching from Zyrtec to another brand such as Allegra, Claritin, or Xyzal.  Start Atrovent nasal spray twice daily to see if this helps with rhinorrhea. - ipratropium (ATROVENT) 0.03 % nasal spray; Place 2 sprays into both nostrils every 12 (twelve) hours.  Dispense: 30 mL; Refill: 2  3. GAD (generalized anxiety disorder) 30-day supply of Zoloft 25 mg provided. - sertraline (ZOLOFT) 25 MG tablet; Take 1 tablet (25 mg total) by mouth at bedtime.  Dispense: 30 tablet; Refill: 2  Return in about 3 months (around 12/28/2019) for CPE with pap smear.  May come sooner per patient's availability. ___________________________________________ Thayer Ohm, DNP, APRN, FNP-BC [facility]

## 2019-11-02 ENCOUNTER — Encounter: Payer: Self-pay | Admitting: Medical-Surgical

## 2019-11-02 NOTE — Patient Instructions (Signed)
Preventive Care 21-35 Years Old, Female Preventive care refers to visits with your health care provider and lifestyle choices that can promote health and wellness. This includes:  A yearly physical exam. This may also be called an annual well check.  Regular dental visits and eye exams.  Immunizations.  Screening for certain conditions.  Healthy lifestyle choices, such as eating a healthy diet, getting regular exercise, not using drugs or products that contain nicotine and tobacco, and limiting alcohol use. What can I expect for my preventive care visit? Physical exam Your health care provider will check your:  Height and weight. This may be used to calculate body mass index (BMI), which tells if you are at a healthy weight.  Heart rate and blood pressure.  Skin for abnormal spots. Counseling Your health care provider may ask you questions about your:  Alcohol, tobacco, and drug use.  Emotional well-being.  Home and relationship well-being.  Sexual activity.  Eating habits.  Work and work environment.  Method of birth control.  Menstrual cycle.  Pregnancy history. What immunizations do I need?  Influenza (flu) vaccine  This is recommended every year. Tetanus, diphtheria, and pertussis (Tdap) vaccine  You may need a Td booster every 10 years. Varicella (chickenpox) vaccine  You may need this if you have not been vaccinated. Human papillomavirus (HPV) vaccine  If recommended by your health care provider, you may need three doses over 6 months. Measles, mumps, and rubella (MMR) vaccine  You may need at least one dose of MMR. You may also need a second dose. Meningococcal conjugate (MenACWY) vaccine  One dose is recommended if you are age 19-21 years and a first-year college student living in a residence hall, or if you have one of several medical conditions. You may also need additional booster doses. Pneumococcal conjugate (PCV13) vaccine  You may need  this if you have certain conditions and were not previously vaccinated. Pneumococcal polysaccharide (PPSV23) vaccine  You may need one or two doses if you smoke cigarettes or if you have certain conditions. Hepatitis A vaccine  You may need this if you have certain conditions or if you travel or work in places where you may be exposed to hepatitis A. Hepatitis B vaccine  You may need this if you have certain conditions or if you travel or work in places where you may be exposed to hepatitis B. Haemophilus influenzae type b (Hib) vaccine  You may need this if you have certain conditions. You may receive vaccines as individual doses or as more than one vaccine together in one shot (combination vaccines). Talk with your health care provider about the risks and benefits of combination vaccines. What tests do I need?  Blood tests  Lipid and cholesterol levels. These may be checked every 5 years starting at age 20.  Hepatitis C test.  Hepatitis B test. Screening  Diabetes screening. This is done by checking your blood sugar (glucose) after you have not eaten for a while (fasting).  Sexually transmitted disease (STD) testing.  BRCA-related cancer screening. This may be done if you have a family history of breast, ovarian, tubal, or peritoneal cancers.  Pelvic exam and Pap test. This may be done every 3 years starting at age 21. Starting at age 30, this may be done every 5 years if you have a Pap test in combination with an HPV test. Talk with your health care provider about your test results, treatment options, and if necessary, the need for more tests.   Follow these instructions at home: Eating and drinking   Eat a diet that includes fresh fruits and vegetables, whole grains, lean protein, and low-fat dairy.  Take vitamin and mineral supplements as recommended by your health care provider.  Do not drink alcohol if: ? Your health care provider tells you not to drink. ? You are  pregnant, may be pregnant, or are planning to become pregnant.  If you drink alcohol: ? Limit how much you have to 0-1 drink a day. ? Be aware of how much alcohol is in your drink. In the [place] one drink equals one 12 oz bottle of beer (355 mL), one 5 oz glass of wine (148 mL), or one 1 oz glass of hard liquor (44 mL). Lifestyle  Take daily care of your teeth and gums.  Stay active. Exercise for at least 30 minutes on 5 or more days each week.  Do not use any products that contain nicotine or tobacco, such as cigarettes, e-cigarettes, and chewing tobacco. If you need help quitting, ask your health care provider.  If you are sexually active, practice safe sex. Use a condom or other form of birth control (contraception) in order to prevent pregnancy and STIs (sexually transmitted infections). If you plan to become pregnant, see your health care provider for a preconception visit. What's next?  Visit your health care provider once a year for a well check visit.  Ask your health care provider how often you should have your eyes and teeth checked.  Stay up to date on all vaccines. This information is not intended to replace advice given to you by your health care provider. Make sure you discuss any questions you have with your health care provider. Document Revised: 02/20/2018 Document Reviewed: 02/20/2018 Elsevier Patient Education  2020 [employer].

## 2019-11-02 NOTE — Progress Notes (Signed)
HPI: Sara Simpson is a 34 y.o. female who  has a past medical history of Anxiety, Asthma, Eczema, History of anemia, Hypertension, Palpitations, and Transaminitis.  she presents to [facility] today, 11/03/19,  for chief complaint of: Annual physical exam with pap smear  Last eye exam Jan 2021 Dentist- no recent care, last check a few years ago Exercises regularly (almost daily) but still gaining weight Trying to eat healthy  Past medical, surgical, social and family history reviewed:  Patient Active Problem List   Diagnosis Date Noted  . GAD (generalized anxiety disorder) 09/28/2019  . Seasonal allergies 09/28/2019  . Wheezing 09/28/2019  . Allergic contact dermatitis 09/18/2019  . 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 VPC's (ventricular premature complexes) 02/01/2019  . Hypertension goal BP (blood pressure) < 130/80 02/01/2019  . Prediabetes 02/01/2019  . NAFL (nonalcoholic fatty liver) 16/03/9603  . Transaminitis 01/28/2019    Past Surgical History:  Procedure Laterality Date  . TUBAL LIGATION      Social History   Tobacco Use  . Smoking status: Never Smoker  . Smokeless tobacco: Never Used  Substance Use Topics  . Alcohol use: Not Currently    Family History  Problem Relation Age of Onset  . Diabetes Mother   . Thyroid disease Sister   . Diabetes Maternal Aunt   . Heart Problems Maternal Aunt   . Diabetes Maternal Grandmother   . Heart attack Maternal Grandfather   . Diabetes Maternal Grandfather   . Cancer Maternal Grandfather   . Asthma Son      Current medication list and allergy/intolerance information reviewed:    Current Outpatient Medications  Medication Sig Dispense Refill  . albuterol (VENTOLIN HFA) 108 (90 Base) MCG/ACT inhaler Inhale 2 puffs into the lungs every 6 (six) hours as needed for wheezing. 6.7 g 5  . Omega-3 Fatty Acids (OMEGA-3 FISH OIL PO) Take 1 capsule by mouth daily.    . sertraline  (ZOLOFT) 25 MG tablet Take 1 tablet (25 mg total) by mouth at bedtime. 30 tablet 2   No current facility-administered medications for this visit.    Allergies  Allergen Reactions  . Penicillins Other (See Comments)    Unknown, childhood reaction  . Fluoxetine Other (See Comments)      Review of Systems:  Constitutional:  No  fever, no chills, No recent illness, + unintentional weight changes. No significant fatigue.   HEENT: No  headache, no vision change, no hearing change, No sore throat, No  sinus pressure  Cardiac: No  chest pain, No  pressure, No palpitations, No  Orthopnea  Respiratory:  No  shortness of breath. No  Cough  Gastrointestinal: No  abdominal pain, No  nausea, No  vomiting,  No  blood in stool, No  diarrhea, No  constipation   Musculoskeletal: No new myalgia/arthralgia  Skin: No  Rash, No other wounds/concerning lesions  Genitourinary: No  incontinence, No  abnormal genital bleeding, No abnormal genital discharge  Hem/Onc: No  easy bruising/bleeding, No  abnormal lymph node  Endocrine: No cold intolerance,  No heat intolerance. No polyuria/polydipsia/polyphagia   Neurologic: No  weakness, No  dizziness, No  slurred speech/focal weakness/facial droop  Psychiatric: No  concerns with depression, No  concerns with anxiety, No sleep problems, No mood problems  Exam:  BP 121/78   Pulse 69   Ht 5' 3.75" (1.619 m)   Wt 249 lb (112.9 kg)   SpO2 95%   BMI 43.08 kg/m  Constitutional: VS see above. General Appearance: alert, well-developed, well-nourished, NAD  Eyes: Normal lids and conjunctive, non-icteric sclera  Ears, Nose, Mouth, Throat: MMM, Normal external inspection ears/nares/mouth/lips/gums. TM normal bilaterally. Pharynx/tonsils no erythema, no exudate. Nasal mucosa normal.   Neck: No masses, trachea midline. No thyroid enlargement. No tenderness/mass appreciated. No lymphadenopathy. + acanthosis nigricans.  Respiratory: Normal respiratory  effort. no wheeze, no rhonchi, no rales  Cardiovascular: S1/S2 normal, no murmur, no rub/gallop auscultated. RRR. No lower extremity edema. Pedal pulse II/IV bilaterally DP and PT. No carotid bruit or JVD. No abdominal aortic bruit.  Gastrointestinal: Nontender, no masses. No hepatomegaly, no splenomegaly. No hernia appreciated. Bowel sounds normal. Rectal exam deferred.   Musculoskeletal: Gait normal. No clubbing/cyanosis of digits.   Neurological: Normal balance/coordination. No tremor. No cranial nerve deficit on limited exam. Motor and sensation intact and symmetric. Cerebellar reflexes intact.   Skin: warm, dry, intact. No rash/ulcer. No concerning nevi or subq nodules on limited exam.    Psychiatric: Normal judgment/insight. Normal mood and affect. Oriented x3.    No results found for this or any previous visit (from the past 72 hour(s)).  No results found.   ASSESSMENT/PLAN:   1. Annual physical exam Checking CBC and Lipid panel. Recent CMP and HgbA1c completed. Discussed weight loss recommendations, insulin resistance, and indications of metabolic syndrome. Patient reports she has recently gotten health insurance but this office is not in network. Will be planning to find a provider in her network soon. - CBC - Lipid panel  2. Screening for cervical cancer Pap smear with HPV, gonorrhea, chlamydia, and trichomonas testing completed.  - Cytology - PAP  Orders Placed This Encounter  Procedures  . CBC  . Lipid panel    No orders of the defined types were placed in this encounter.   Patient Instructions  Preventive Care 60-74 Years Old, Female Preventive care refers to visits with your health care provider and lifestyle choices that can promote health and wellness. This includes:  A yearly physical exam. This may also be called an annual well check.  Regular dental visits and eye exams.  Immunizations.  Screening for certain conditions.  Healthy lifestyle  choices, such as eating a healthy diet, getting regular exercise, not using drugs or products that contain nicotine and tobacco, and limiting alcohol use. What can I expect for my preventive care visit? Physical exam Your health care provider will check your:  Height and weight. This may be used to calculate body mass index (BMI), which tells if you are at a healthy weight.  Heart rate and blood pressure.  Skin for abnormal spots. Counseling Your health care provider may ask you questions about your:  Alcohol, tobacco, and drug use.  Emotional well-being.  Home and relationship well-being.  Sexual activity.  Eating habits.  Work and work Statistician.  Method of birth control.  Menstrual cycle.  Pregnancy history. What immunizations do I need?  Influenza (flu) vaccine  This is recommended every year. Tetanus, diphtheria, and pertussis (Tdap) vaccine  You may need a Td booster every 10 years. Varicella (chickenpox) vaccine  You may need this if you have not been vaccinated. Human papillomavirus (HPV) vaccine  If recommended by your health care provider, you may need three doses over 6 months. Measles, mumps, and rubella (MMR) vaccine  You may need at least one dose of MMR. You may also need a second dose. Meningococcal conjugate (MenACWY) vaccine  One dose is recommended if you are age 72-21 years and  a Market researcher living in a residence hall, or if you have one of several medical conditions. You may also need additional booster doses. Pneumococcal conjugate (PCV13) vaccine  You may need this if you have certain conditions and were not previously vaccinated. Pneumococcal polysaccharide (PPSV23) vaccine  You may need one or two doses if you smoke cigarettes or if you have certain conditions. Hepatitis A vaccine  You may need this if you have certain conditions or if you travel or work in places where you may be exposed to hepatitis A. Hepatitis B  vaccine  You may need this if you have certain conditions or if you travel or work in places where you may be exposed to hepatitis B. Haemophilus influenzae type b (Hib) vaccine  You may need this if you have certain conditions. You may receive vaccines as individual doses or as more than one vaccine together in one shot (combination vaccines). Talk with your health care provider about the risks and benefits of combination vaccines. What tests do I need?  Blood tests  Lipid and cholesterol levels. These may be checked every 5 years starting at age 3.  Hepatitis C test.  Hepatitis B test. Screening  Diabetes screening. This is done by checking your blood sugar (glucose) after you have not eaten for a while (fasting).  Sexually transmitted disease (STD) testing.  BRCA-related cancer screening. This may be done if you have a family history of breast, ovarian, tubal, or peritoneal cancers.  Pelvic exam and Pap test. This may be done every 3 years starting at age 48. Starting at age 1, this may be done every 5 years if you have a Pap test in combination with an HPV test. Talk with your health care provider about your test results, treatment options, and if necessary, the need for more tests. Follow these instructions at home: Eating and drinking   Eat a diet that includes fresh fruits and vegetables, whole grains, lean protein, and low-fat dairy.  Take vitamin and mineral supplements as recommended by your health care provider.  Do not drink alcohol if: ? Your health care provider tells you not to drink. ? You are pregnant, may be pregnant, or are planning to become pregnant.  If you drink alcohol: ? Limit how much you have to 0-1 drink a day. ? Be aware of how much alcohol is in your drink. In the [place] one drink equals one 12 oz bottle of beer (355 mL), one 5 oz glass of wine (148 mL), or one 1 oz glass of hard liquor (44 mL). Lifestyle  Take daily care of your teeth and  gums.  Stay active. Exercise for at least 30 minutes on 5 or more days each week.  Do not use any products that contain nicotine or tobacco, such as cigarettes, e-cigarettes, and chewing tobacco. If you need help quitting, ask your health care provider.  If you are sexually active, practice safe sex. Use a condom or other form of birth control (contraception) in order to prevent pregnancy and STIs (sexually transmitted infections). If you plan to become pregnant, see your health care provider for a preconception visit. What's next?  Visit your health care provider once a year for a well check visit.  Ask your health care provider how often you should have your eyes and teeth checked.  Stay up to date on all vaccines. This information is not intended to replace advice given to you by your health care provider. Make sure you  discuss any questions you have with your health care provider. Document Revised: 02/20/2018 Document Reviewed: 02/20/2018 Elsevier Patient Education  [employer].   Follow-up plan: Return in about 1 year (around 11/02/2020) for annual physical exam.  Clearnce Sorrel, DNP, APRN, FNP-BC [facility]

## 2019-11-03 ENCOUNTER — Other Ambulatory Visit (HOSPITAL_COMMUNITY)
Admission: RE | Admit: 2019-11-03 | Discharge: 2019-11-03 | Disposition: A | Discharge status: Home / Self Care | Payer: Self-pay | Source: Ambulatory Visit | Attending: Medical-Surgical | Admitting: Medical-Surgical

## 2019-11-03 ENCOUNTER — Ambulatory Visit (INDEPENDENT_AMBULATORY_CARE_PROVIDER_SITE_OTHER): Payer: Self-pay | Admitting: Medical-Surgical

## 2019-11-03 ENCOUNTER — Other Ambulatory Visit: Payer: Self-pay

## 2019-11-03 VITALS — BP 121/78 | HR 69 | Ht 63.75 in | Wt 249.0 lb

## 2019-11-03 DIAGNOSIS — Z124 Encounter for screening for malignant neoplasm of cervix: Secondary | ICD-10-CM

## 2019-11-03 DIAGNOSIS — Z0000 Encounter for general adult medical examination without abnormal findings: Secondary | ICD-10-CM

## 2019-11-04 LAB — CYTOLOGY - PAP
Chlamydia: NEGATIVE
Comment: NEGATIVE
Comment: NEGATIVE
Comment: NEGATIVE
Comment: NORMAL
Diagnosis: NEGATIVE
High risk HPV: NEGATIVE
Neisseria Gonorrhea: NEGATIVE
Trichomonas: NEGATIVE

## 2020-02-26 ENCOUNTER — Other Ambulatory Visit: Payer: Self-pay | Admitting: Medical-Surgical

## 2020-02-26 DIAGNOSIS — F411 Generalized anxiety disorder: Secondary | ICD-10-CM

## 2020-02-28 ENCOUNTER — Encounter: Payer: Self-pay | Admitting: Medical-Surgical

## 2020-03-01 NOTE — Telephone Encounter (Signed)
addressed

## 2020-05-07 IMAGING — US ULTRASOUND ABDOMEN LIMITED
1 series · 14 of 25 positions shown · non-contrast
Comparison: None

CLINICAL DATA: Persistent transaminitis

EXAM:
ULTRASOUND ABDOMEN LIMITED RIGHT UPPER QUADRANT

[Series 1: ultrasound abdomen limited · 14 of 53 slices shown]
[im 1/53]
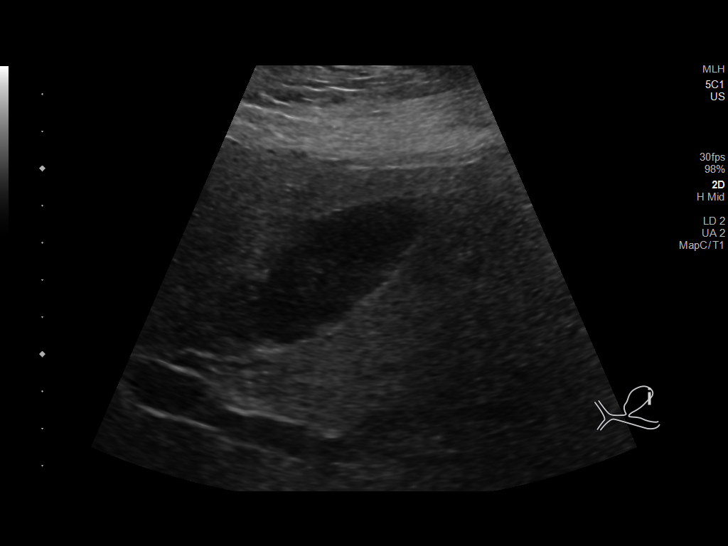
[im 5/53]
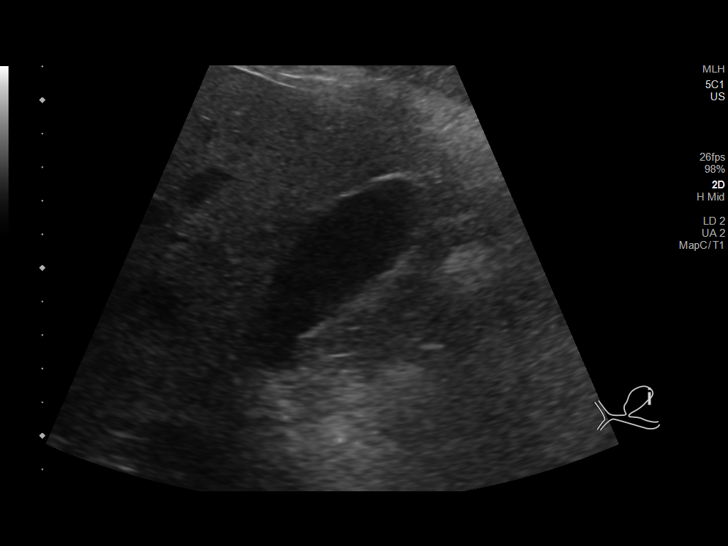
[im 9/53]
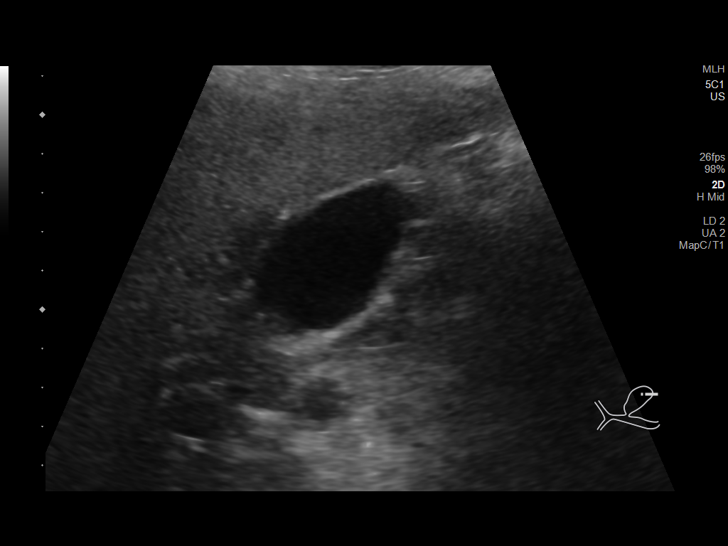
[im 14/53]
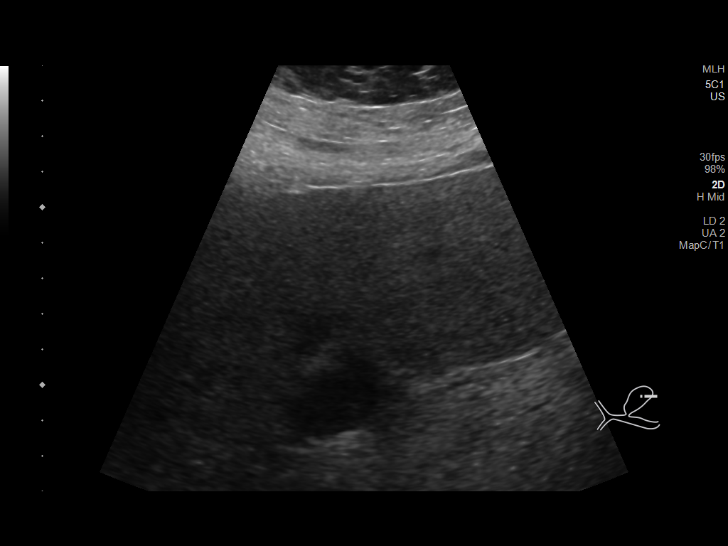
[im 18/53]
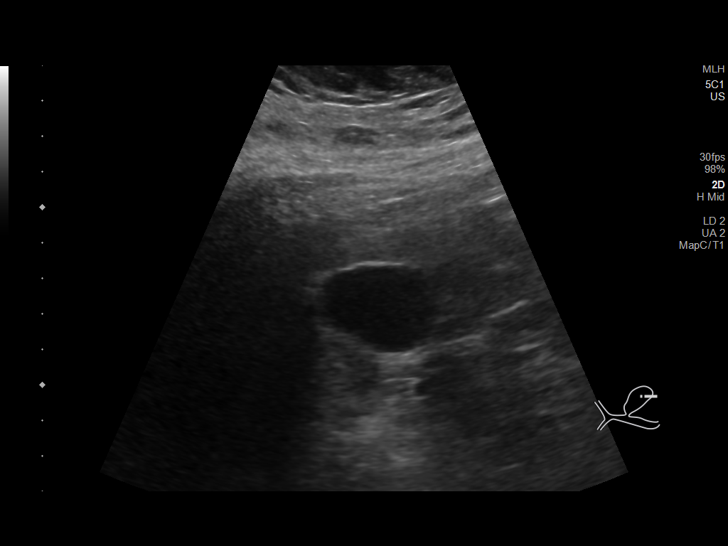
[im 20/53]
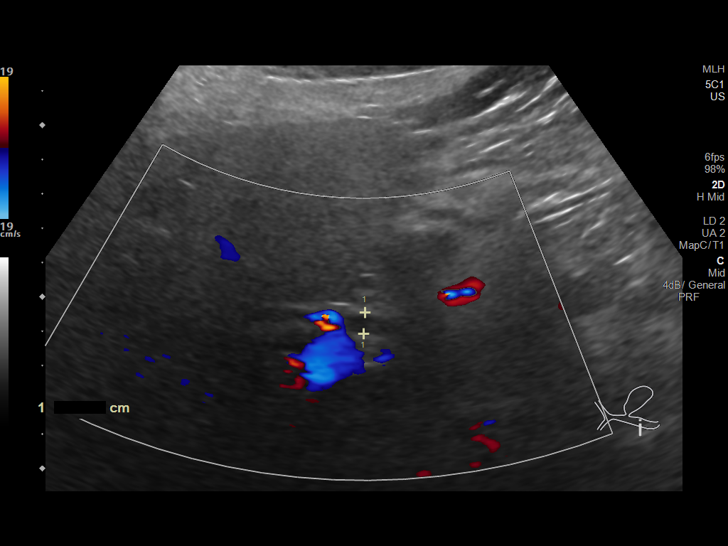
[im 24/53]
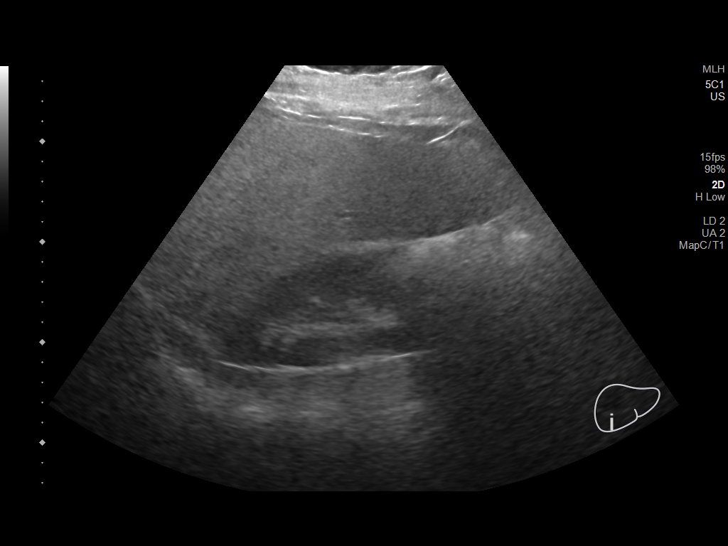
[im 29/53]
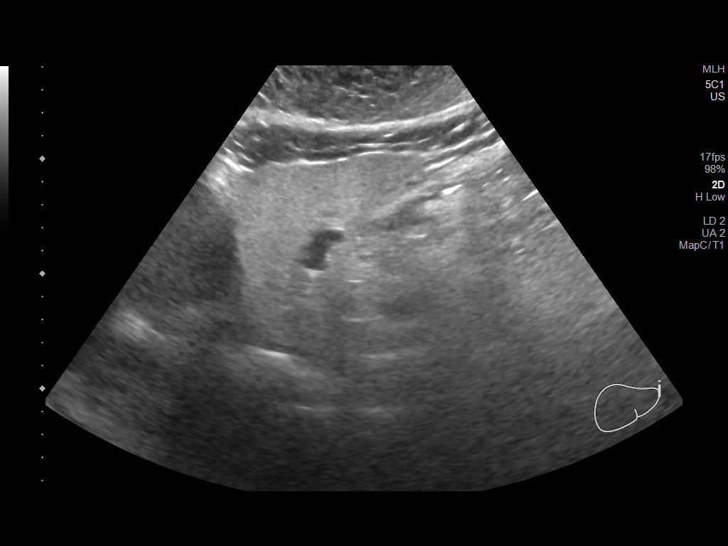
[im 33/53]
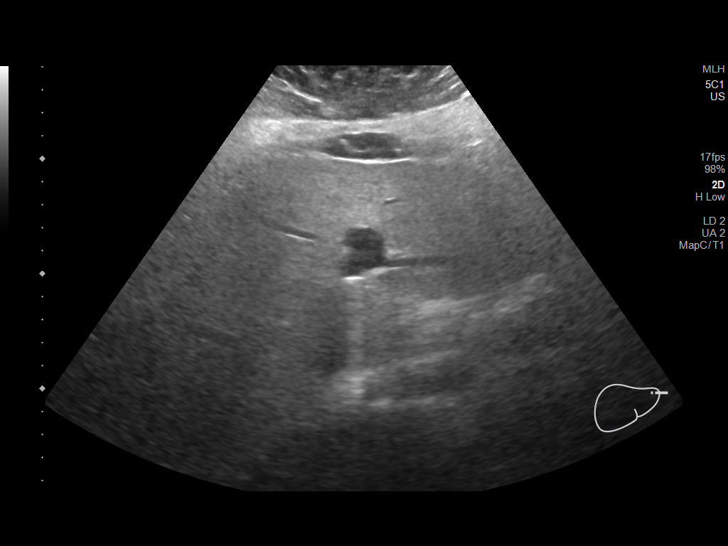
[im 35/53]
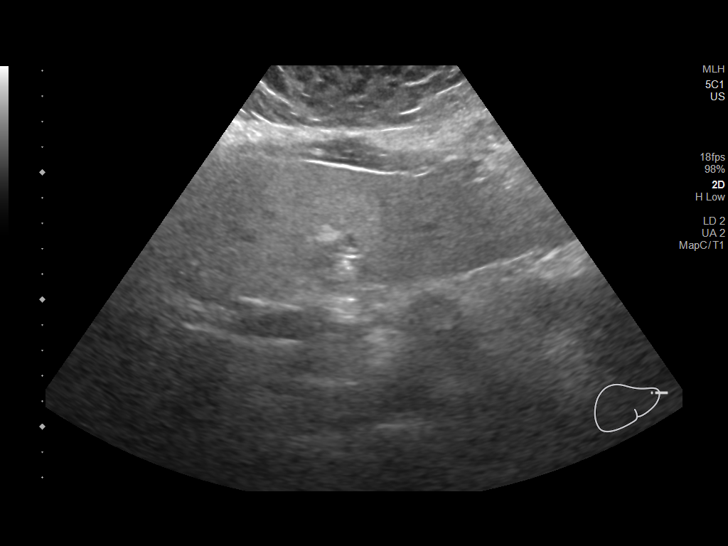
[im 40/53]
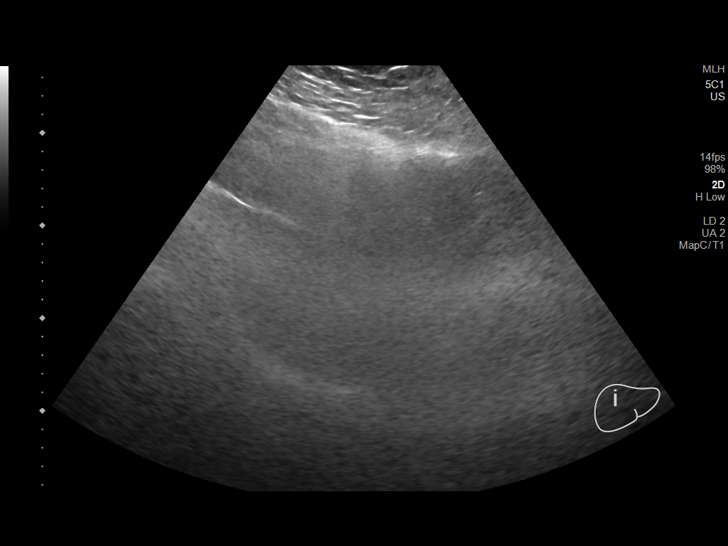
[im 44/53]
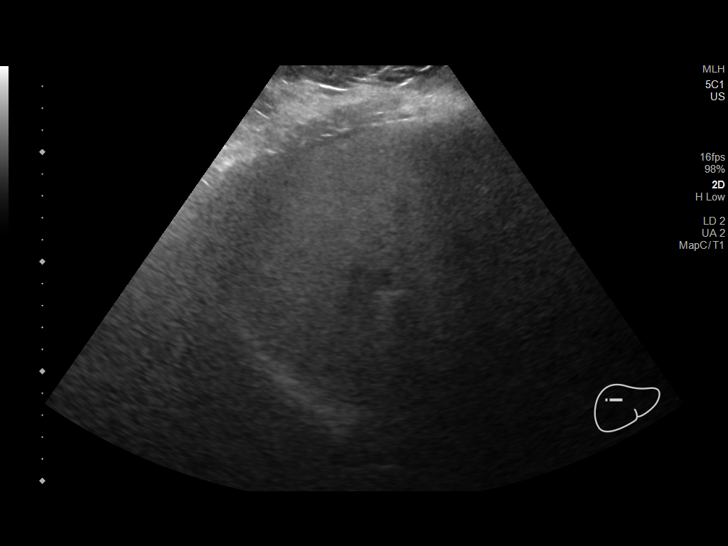
[im 48/53]
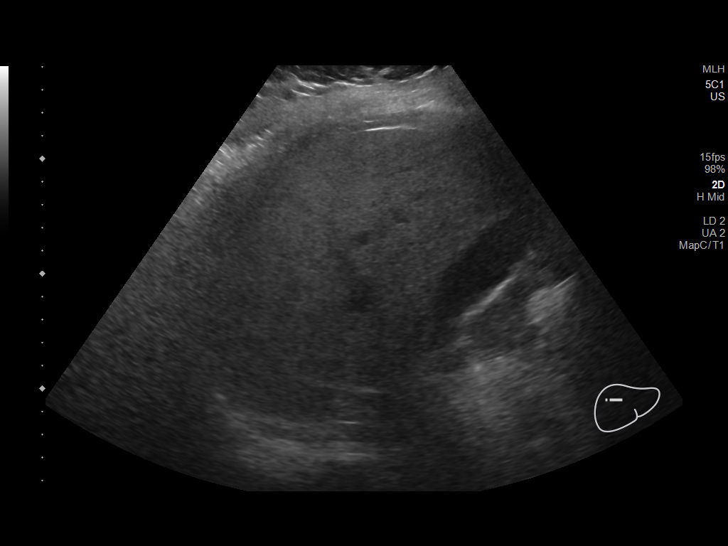
[im 53/53]
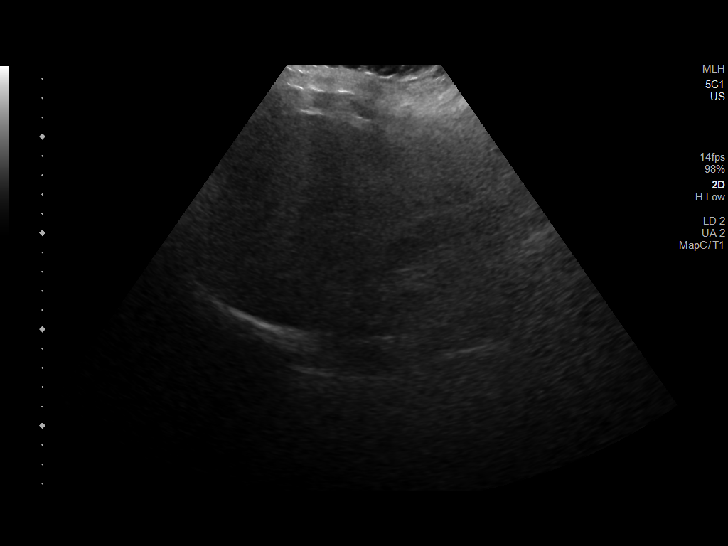

[14 of 25 positions shown; findings below may reference images not displayed]

FINDINGS: Gallbladder:

Normally distended without stones or wall thickening. No
pericholecystic fluid or sonographic Murphy sign.

Common bile duct:

Diameter: 6 mm diameter, upper normal

Liver:

Echogenic parenchyma, likely fatty infiltration though this can be
seen with cirrhosis and certain infiltrative disorders. Suboptimal
assessment of intrahepatic detail due to sound attenuation as a
result of body habitus and increased hepatic echogenicity. No gross
focal hepatic mass or nodularity. Portal vein is patent on color
Doppler imaging with normal direction of blood flow towards the
liver.

Other: No RIGHT upper quadrant free fluid.
IMPRESSION: Probable fatty infiltration of liver as above.

No other definite sonographic abnormalities.

## 2021-03-30 ENCOUNTER — Other Ambulatory Visit: Payer: Self-pay

## 2021-03-30 ENCOUNTER — Ambulatory Visit (HOSPITAL_COMMUNITY)
Admission: EM | Admit: 2021-03-30 | Discharge: 2021-03-30 | Disposition: A | Discharge status: Home / Self Care | Payer: Self-pay | Attending: Emergency Medicine | Admitting: Emergency Medicine

## 2021-03-30 ENCOUNTER — Encounter (HOSPITAL_COMMUNITY): Payer: Self-pay | Admitting: Emergency Medicine

## 2021-03-30 DIAGNOSIS — N898 Other specified noninflammatory disorders of vagina: Secondary | ICD-10-CM | POA: Insufficient documentation

## 2021-03-30 LAB — HIV ANTIBODY (ROUTINE TESTING W REFLEX): HIV Screen 4th Generation wRfx: NONREACTIVE

## 2021-03-30 MED ORDER — FLUCONAZOLE 150 MG PO TABS
150.0000 mg | ORAL_TABLET | Freq: Every day | ORAL | 0 refills | Status: AC
Start: 2021-03-30 — End: 2021-04-01

## 2021-03-30 NOTE — ED Provider Notes (Addendum)
[facility]    CSN: [id number] Arrival date & time: 03/30/21  1041      History   Chief Complaint Chief Complaint  Patient presents with   Groin Swelling   Vaginal Itching    HPI Sara Simpson is a 35 y.o. female.   Patient presents with vaginal irritation and itching for 3 weeks.  Endorses sensation of genital swelling particularly around the clitoris and urethra .denies discharge, odor, urinary frequency, urgency, hematuria, abdominal pain, flank pain.  Sexually active, last encounter 4 to 5 months ago, 2 partners, sometimes condom use.  Has attempted use of over-the-counter antifungal creams which do provide some relief but symptoms shortly returned after.  History of anxiety, asthma, eczema, hypertension, prediabetes.   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      Home Medications    Prior to Admission medications   Medication Sig Start Date End Date Taking? Authorizing Provider  albuterol (VENTOLIN HFA) 108 (90 Base) MCG/ACT inhaler Inhale 2 puffs into the lungs every 6 (six) hours as needed for wheezing. 09/28/19   Christen Butter, NP  fluconazole (DIFLUCAN) 150 MG tablet Take 1 tablet (150 mg total) by mouth daily for 2 doses. 03/30/21 04/01/21 Yes Ercell Perlman R, NP  Omega-3 Fatty  Acids (OMEGA-3 FISH OIL PO) Take 1 capsule by mouth daily.    [provider]  sertraline (ZOLOFT) 25 MG tablet Take 1 tablet (25 mg total) by mouth at bedtime. Will need appt before 3 month supply runs out 03/01/20   Christen Butter, NP    Family History Family History  Problem Relation Age of Onset   Diabetes Mother    Thyroid disease Sister    Diabetes Maternal Aunt    Heart Problems Maternal Aunt    Diabetes Maternal Grandmother    Heart attack Maternal Grandfather    Diabetes Maternal Grandfather    Cancer Maternal Grandfather    Asthma Son     Social History Social History   Tobacco Use   Smoking status: Never   Smokeless tobacco: Never  Vaping Use   Vaping Use: Never used  Substance Use Topics   Alcohol use: Not Currently   Drug use: Never     Allergies   Penicillins and Fluoxetine   Review of Systems Review of Systems  Constitutional: Negative.   Respiratory: Negative.    Cardiovascular: Negative.   Genitourinary:  Positive for vaginal pain. Negative for decreased urine volume, difficulty urinating, dyspareunia, dysuria, enuresis, flank pain, frequency, genital sores, hematuria, menstrual problem, pelvic pain, urgency, vaginal bleeding and vaginal discharge.  Neurological: Negative.     Physical Exam Triage Vital Signs ED Triage Vitals  Enc Vitals Group     BP 03/30/21 1100 132/89  Pulse Rate 03/30/21 1100 87     Resp 03/30/21 1100 16     Temp 03/30/21 1100 98.1 F (36.7 C)     Temp Source 03/30/21 1100 Oral     SpO2 03/30/21 1100 95 %     Weight --      Height --      Head Circumference --      Peak Flow --      Pain Score 03/30/21 1059 4     Pain Loc --      Pain Edu? --      Excl. in GC? --    No data found.  Updated Vital Signs BP 132/89 (BP Location: Right Arm)   Pulse 87   Temp 98.1 F (36.7 C) (Oral)   Resp 16   SpO2 95%   Visual Acuity Right Eye Distance:   Left Eye Distance:   Bilateral Distance:    Right Eye  Near:   Left Eye Near:    Bilateral Near:     Physical Exam Constitutional:      Appearance: Normal appearance. She is normal weight.  HENT:     Head: Normocephalic.  Eyes:     Extraocular Movements: Extraocular movements intact.  Pulmonary:     Effort: Pulmonary effort is normal.  Genitourinary:    Comments: Deferred self collect vaginal swab Skin:    General: Skin is warm and dry.  Neurological:     Mental Status: She is alert and oriented to person, place, and time. Mental status is at baseline.  Psychiatric:        Mood and Affect: Mood normal.        Behavior: Behavior normal.     UC Treatments / Results  Labs (all labs ordered are listed, but only abnormal results are displayed) Labs Reviewed  HIV ANTIBODY (ROUTINE TESTING W REFLEX)  RPR  CERVICOVAGINAL ANCILLARY ONLY    EKG   Radiology No results found.  Procedures Procedures (including critical care time)  Medications Ordered in UC Medications - No data to display  Initial Impression / Assessment and Plan / UC Course  I have reviewed the triage vital signs and the nursing notes.  Pertinent labs & imaging results that were available during my care of the patient were reviewed by me and considered in my medical decision making (see chart for details).  Vaginal irritation  Will treat prophylactically for yeast while labs pending  Diflucan 150 mg once, 150 mg as needed in 3 days  STI screening pending, will treat per protocol, advised abstinence until labs results and symptoms have resolve .Given walking referral to the [facility] for follow-up for persistent or reoccurring symptoms Final Clinical Impressions(s) / UC Diagnoses   Final diagnoses:  Vaginal irritation     Discharge Instructions      You are being treated today prophylactically for a yeast infection while your other labs are pending  Take Diflucan once, if still having symptoms in 3 days take second dose  If symptoms  continue to persist after medication use and all other testing is negative please follow-up with your gynecologist, [facility], follow-up at urgent care if unable to get appointment  Labs pending 2-3 days, you will be contacted if positive for any sti and treatment will be sent to the pharmacy, you will have to return to the clinic if positive for gonorrhea to receive treatment   Please refrain from  having sex until labs results, if positive please refrain from having sex until treatment complete and symptoms resolve   If positive for HIV, Syphilis, Chlamydia  gonorrhea or trichomoniasis please notify partner or partners so they may tested as well  Moving forward, it is recommended you use some form of protection against the transmission of sti infections  such as condoms or dental dams with each sexual encounter     ED Prescriptions     Medication Sig Dispense Auth. Provider   fluconazole (DIFLUCAN) 150 MG tablet Take 1 tablet (150 mg total) by mouth daily for 2 doses. 2 tablet Valinda Hoar, NP      PDMP not reviewed this encounter.   Valinda Hoar, NP 03/30/21 1145    Salli Quarry R, NP 03/30/21 1321

## 2021-03-30 NOTE — Discharge Instructions (Addendum)
You are being treated today prophylactically for a yeast infection while your other labs are pending  Take Diflucan once, if still having symptoms in 3 days take second dose  If symptoms continue to persist after medication use and all other testing is negative please follow-up with your gynecologist, [facility] listed below, follow-up at urgent care if unable to get appointment  Labs pending 2-3 days, you will be contacted if positive for any sti and treatment will be sent to the pharmacy, you will have to return to the clinic if positive for gonorrhea to receive treatment   Please refrain from having sex until labs results, if positive please refrain from having sex until treatment complete and symptoms resolve   If positive for HIV, Syphilis, Chlamydia  gonorrhea or trichomoniasis please notify partner or partners so they may tested as well  Moving forward, it is recommended you use some form of protection against the transmission of sti infections  such as condoms or dental dams with each sexual encounter

## 2021-03-30 NOTE — ED Triage Notes (Signed)
Pt reports for 3 weeks had vaginal irritation. Reports tried treating with OTC medications but keeps coming back. Reports swelling and itching/pain more around clitoris and urethra area.

## 2021-03-31 LAB — CERVICOVAGINAL ANCILLARY ONLY
Bacterial Vaginitis (gardnerella): NEGATIVE
Candida Glabrata: NEGATIVE
Candida Vaginitis: POSITIVE — AB
Chlamydia: NEGATIVE
Comment: NEGATIVE
Comment: NEGATIVE
Comment: NEGATIVE
Comment: NEGATIVE
Comment: NEGATIVE
Comment: NORMAL
Neisseria Gonorrhea: NEGATIVE
Trichomonas: NEGATIVE

## 2021-03-31 LAB — RPR: RPR Ser Ql: NONREACTIVE

## 2021-04-02 ENCOUNTER — Telehealth: Payer: Self-pay | Admitting: Emergency Medicine

## 2021-04-02 NOTE — Telephone Encounter (Signed)
Positive for yeast; pt treated and verbalized understanding of results

## 2021-05-01 ENCOUNTER — Encounter: Payer: Self-pay | Admitting: Medical-Surgical

## 2021-05-01 ENCOUNTER — Ambulatory Visit (INDEPENDENT_AMBULATORY_CARE_PROVIDER_SITE_OTHER): Payer: Self-pay | Admitting: Medical-Surgical

## 2021-05-01 ENCOUNTER — Other Ambulatory Visit: Payer: Self-pay

## 2021-05-01 VITALS — BP 125/75 | HR 85 | Temp 98.4°F | Ht 63.75 in | Wt 251.0 lb

## 2021-05-01 DIAGNOSIS — E1165 Type 2 diabetes mellitus with hyperglycemia: Secondary | ICD-10-CM

## 2021-05-01 DIAGNOSIS — B999 Unspecified infectious disease: Secondary | ICD-10-CM

## 2021-05-01 DIAGNOSIS — B3731 Acute candidiasis of vulva and vagina: Secondary | ICD-10-CM

## 2021-05-01 LAB — WET PREP FOR TRICH, YEAST, CLUE
MICRO NUMBER:: 12602364
Specimen Quality: ADEQUATE

## 2021-05-01 LAB — POCT GLYCOSYLATED HEMOGLOBIN (HGB A1C): Hemoglobin A1C: 10.6 % — AB (ref 4.0–5.6)

## 2021-05-01 MED ORDER — BLOOD GLUCOSE MONITOR KIT: PACK | 0 refills | Status: AC

## 2021-05-01 MED ORDER — METRONIDAZOLE 500 MG PO TABS: 500.0000 mg | ORAL_TABLET | Freq: Two times a day (BID) | ORAL | 0 refills | Status: AC

## 2021-05-01 MED ORDER — METFORMIN HCL ER 500 MG PO TB24: 500.0000 mg | ORAL_TABLET | Freq: Every day | ORAL | 11 refills | Status: DC

## 2021-05-01 NOTE — Addendum Note (Signed)
Addended byChristen Butter on: 05/01/2021 05:19 PM   Modules accepted: Orders

## 2021-05-01 NOTE — Patient Instructions (Signed)
Vaginal Yeast Infection, Adult °Vaginal yeast infection is a condition that causes vaginal discharge as well as soreness, swelling, and redness (inflammation) of the vagina. This is a common condition. Some women get this infection frequently. °What are the causes? °This condition is caused by a change in the normal balance of the yeast (Candida) and normal bacteria that live in the vagina. This change causes an overgrowth of yeast, which causes the inflammation. °What increases the risk? °The condition is more likely to develop in women who: °Take antibiotic medicines. °Have diabetes. °Take birth control pills. °Are pregnant. °Douche often. °Have a weak body defense system (immune system). °Have been taking steroid medicines for a long time. °Frequently wear tight clothing. °What are the signs or symptoms? °Symptoms of this condition include: °White, thick, creamy vaginal discharge. °Swelling, itching, redness, and irritation of the vagina. The lips of the vagina (labia) may be affected as well. °Pain or a burning feeling while urinating. °Pain during sex. °How is this diagnosed? °This condition is diagnosed based on: °Your medical history. °A physical exam. °A pelvic exam. Your health care provider will examine a sample of your vaginal discharge under a microscope. Your health care provider may send this sample for testing to confirm the diagnosis. °How is this treated? °This condition is treated with medicine. Medicines may be over-the-counter or prescription. You may be told to use one or more of the following: °Medicine that is taken by mouth (orally). °Medicine that is applied as a cream (topically). °Medicine that is inserted directly into the vagina (suppository). °Follow these instructions at home: °Take or apply over-the-counter and prescription medicines only as told by your health care provider. °Do not use tampons until your health care provider approves. °Do not have sex until your infection has  cleared. Sex can prolong or worsen your symptoms of infection. Ask your health care provider when it is safe to resume sexual activity. °Keep all follow-up visits. This is important. °How is this prevented? ° °Do not wear tight clothes, such as pantyhose or tight pants. °Wear breathable cotton underwear. °Do not use douches, perfumed soap, creams, or powders. °Wipe from front to back after using the toilet. °If you have diabetes, keep your blood sugar levels under control. °Ask your health care provider for other ways to prevent yeast infections. °Contact a health care provider if: °You have a fever. °Your symptoms go away and then return. °Your symptoms do not get better with treatment. °Your symptoms get worse. °You have new symptoms. °You develop blisters in or around your vagina. °You have blood coming from your vagina and it is not your menstrual period. °You develop pain in your abdomen. °Summary °Vaginal yeast infection is a condition that causes discharge as well as soreness, swelling, and redness (inflammation) of the vagina. °This condition is treated with medicine. Medicines may be over-the-counter or prescription. °Take or apply over-the-counter and prescription medicines only as told by your health care provider. °Do not douche. Resume sexual activity or use of tampons as instructed by your health care provider. °Contact a health care provider if your symptoms do not get better with treatment or your symptoms go away and then return. °This information is not intended to replace advice given to you by your health care provider. Make sure you discuss any questions you have with your health care provider. °Document Revised: 08/29/2020 Document Reviewed: 08/29/2020 °Elsevier Patient Education © 2022 [employer] ° °

## 2021-05-01 NOTE — Progress Notes (Signed)
  HPI with pertinent ROS:   CC: Recurrent yeast infections  HPI: Pleasant 35 year old female presenting today for evaluation after having recurrent vaginal yeast infections over the last couple of months.  Notes that her worst symptoms were in the clitoral area.  She completed 2 doses of Diflucan and is also completed 2 doses of the 1 day over-the-counter yeast infection treatment.  Her symptoms have resolved today however she is worried that there may be something else going on.  She went to urgent care and tested negative for STDs.  She is sexually active with female partners but her last activity was in June.  Has noted episodes of jitteriness periodically over the past few weeks.  I reviewed the past medical history, family history, social history, surgical history, and allergies today and no changes were needed.  Please see the problem list section below in epic for further details.  Physical exam:   General: Well Developed, well nourished, and in no acute distress.  Neuro: Alert and oriented x3.  HEENT: Normocephalic, atraumatic.  Skin: Warm and dry. Cardiac: Regular rate and rhythm, no murmurs rubs or gallops, no lower extremity edema.  Respiratory: Clear to auscultation bilaterally. Not using accessory muscles, speaking in full sentences.  Impression and Recommendations:    1. Vulvovaginal candidiasis 2. Recurrent infections Stat wet prep to ensure clearance of yeast infection.  POCT hemoglobin A1c to check for diabetes with result of 10.6% indicating new diagnosis diabetic.  Checking labs as below. - WET PREP FOR TRICH, YEAST, CLUE - POCT HgB A1C - COMPLETE METABOLIC PANEL WITH GFR - TSH - CBC with Differential/Platelet  3. Uncontrolled type 2 diabetes mellitus with hyperglycemia [facility]) Patient understandably upset with a new diagnosis of type 2 diabetes.  Referring to diabetic education to give her the tools that she needs to help manage as well.  Printed prescription for  glucometer provided with recommendation for checking sugars 3 times weekly fasting.  Starting metformin 500 mg XR daily.  Discussed signs and symptoms of elevated glucose as well as hypoglycemia.  All questions answered during our appointment but advised patient that should she think of further questions, she should absolutely reach out to me via MyChart or by phone and I will be glad to answer them to the best of my ability. - Ambulatory referral to diabetic education  Return in about 4 weeks (around 05/29/2021) for DM follow up. ___________________________________________ Thayer Ohm, DNP, APRN, FNP-BC Primary Care and Sports Medicine [facility] [place]

## 2021-05-02 ENCOUNTER — Encounter: Payer: Self-pay | Admitting: Medical-Surgical

## 2021-05-16 ENCOUNTER — Encounter: Payer: Managed Care, Other (non HMO) | Attending: Medical-Surgical

## 2021-05-23 ENCOUNTER — Ambulatory Visit: Payer: Self-pay

## 2021-05-30 ENCOUNTER — Ambulatory Visit: Payer: Self-pay

## 2021-06-02 ENCOUNTER — Encounter: Payer: Self-pay | Admitting: Medical-Surgical

## 2021-06-02 ENCOUNTER — Other Ambulatory Visit: Payer: Self-pay

## 2021-06-02 ENCOUNTER — Ambulatory Visit (INDEPENDENT_AMBULATORY_CARE_PROVIDER_SITE_OTHER): Payer: Self-pay | Admitting: Medical-Surgical

## 2021-06-02 VITALS — BP 107/78 | HR 72 | Resp 20 | Ht 63.75 in | Wt 246.5 lb

## 2021-06-02 DIAGNOSIS — E1165 Type 2 diabetes mellitus with hyperglycemia: Secondary | ICD-10-CM

## 2021-06-02 LAB — POCT UA - MICROALBUMIN
Albumin/Creatinine Ratio, Urine, POC: <30
Creatinine, POC: 100 mg/dL
Microalbumin Ur, POC: 10 mg/L

## 2021-06-02 NOTE — Progress Notes (Signed)
  HPI with pertinent ROS:   CC: Diabetes follow up  HPI: Pleasant 35 year old female presenting today for follow up on recent diagnosis of type 2 diabetes. She has been doing well since her last visit. Notes that her last 4 weeks have been a steep learning curve and she has had a lot changes. She is taking Metformin XR 500mg  daily as prescribed, tolerating well without side effects. Has been checking her sugars daily and getting numbers between 110 and 140 most mornings. Over the last couple of days, she has had fasting sugars in the 80s which she reports was very scary since she was worried that it would drop lower. Has been making dietary changes and working on healthier choices. Notes that she has already lost a few pounds. Not exercising currently because she is scared her sugar will drop too low with exercise. Would like to get her labs checked to make sure her kidney function is okay and get a basic panel done.   I reviewed the past medical history, family history, social history, surgical history, and allergies today and no changes were needed.  Please see the problem list section below in epic for further details.   Physical exam:   General: Well Developed, well nourished, and in no acute distress.  Neuro: Alert and oriented x3.  HEENT: Normocephalic, atraumatic.  Skin: Warm and dry. Cardiac: Regular rate and rhythm.  Respiratory: Not using accessory muscles, speaking in full sentences.  Impression and Recommendations:    1. Uncontrolled type 2 diabetes mellitus with hyperglycemia (HCC) Continue Metformin XR 500mg  daily. Continue to monitor blood sugar. Discussed goals for blood sugar and safe range that is considered normal. Reviewed measures to take if sugar too low or symptomatic. Advised that exercise is ok but to make sure to eat a healthy snack before and after. Discussed the role of protein in maintaining glucose levels. Labs ordered at last visit still active. Adding lipid panel  today. POCT microalbumin normal.  - Lipid panel - POCT UA - Microalbumin  Return in about 2 months (around 08/03/2021) for DM follow up. ___________________________________________ , DNP, APRN, FNP-BC Primary Care and Sports Medicine [facility] [place]

## 2021-06-03 LAB — LIPID PANEL
Cholesterol: 206 mg/dL — ABNORMAL HIGH (ref <200)
HDL: 47 mg/dL — ABNORMAL LOW (ref >=50)
LDL Cholesterol (Calc): 137 mg/dL (calc) — ABNORMAL HIGH
Non-HDL Cholesterol (Calc): 159 mg/dL (calc) — ABNORMAL HIGH (ref <130)
Total CHOL/HDL Ratio: 4.4 (calc) (ref <5.0)
Triglycerides: 108 mg/dL (ref <150)

## 2021-06-03 LAB — COMPLETE METABOLIC PANEL WITH GFR
AG Ratio: 1.1 (calc) (ref 1.0–2.5)
ALT: 220 U/L — ABNORMAL HIGH (ref 6–29)
AST: 144 U/L — ABNORMAL HIGH (ref 10–30)
Albumin: 4.3 g/dL (ref 3.6–5.1)
Alkaline phosphatase (APISO): 62 U/L (ref 31–125)
BUN: 9 mg/dL (ref 7–25)
CO2: 23 mmol/L (ref 20–32)
Calcium: 9.7 mg/dL (ref 8.6–10.2)
Chloride: 104 mmol/L (ref 98–110)
Creat: 0.5 mg/dL (ref 0.50–0.97)
Globulin: 3.9 g/dL (calc) — ABNORMAL HIGH (ref 1.9–3.7)
Glucose, Bld: 101 mg/dL — ABNORMAL HIGH (ref 65–99)
Potassium: 4 mmol/L (ref 3.5–5.3)
Sodium: 138 mmol/L (ref 135–146)
Total Bilirubin: 0.8 mg/dL (ref 0.2–1.2)
Total Protein: 8.2 g/dL — ABNORMAL HIGH (ref 6.1–8.1)
eGFR: 125 mL/min/{1.73_m2} (ref >=60)

## 2021-06-03 LAB — TSH: TSH: 2.49 mIU/L

## 2021-06-03 LAB — CBC WITH DIFFERENTIAL/PLATELET
Absolute Monocytes: 511 cells/uL (ref 200–950)
Basophils Absolute: 29 cells/uL (ref 0–200)
Basophils Relative: 0.4 %
Eosinophils Absolute: 153 cells/uL (ref 15–500)
Eosinophils Relative: 2.1 %
HCT: 41.4 % (ref 35.0–45.0)
Hemoglobin: 14.6 g/dL (ref 11.7–15.5)
Lymphs Abs: 3643 cells/uL (ref 850–3900)
MCH: 36.2 pg — ABNORMAL HIGH (ref 27.0–33.0)
MCHC: 35.3 g/dL (ref 32.0–36.0)
MCV: 102.7 fL — ABNORMAL HIGH (ref 80.0–100.0)
MPV: 9.2 fL (ref 7.5–12.5)
Monocytes Relative: 7 %
Neutro Abs: 2964 cells/uL (ref 1500–7800)
Neutrophils Relative %: 40.6 %
Platelets: 288 10*3/uL (ref 140–400)
RBC: 4.03 10*6/uL (ref 3.80–5.10)
RDW: 11.9 % (ref 11.0–15.0)
Total Lymphocyte: 49.9 %
WBC: 7.3 10*3/uL (ref 3.8–10.8)

## 2021-06-05 ENCOUNTER — Encounter: Payer: Self-pay | Admitting: Medical-Surgical

## 2021-06-05 DIAGNOSIS — E1165 Type 2 diabetes mellitus with hyperglycemia: Secondary | ICD-10-CM

## 2021-06-05 DIAGNOSIS — R748 Abnormal levels of other serum enzymes: Secondary | ICD-10-CM

## 2021-06-06 MED ORDER — FREESTYLE LIBRE 2 SENSOR MISC: 1.0000 | 1 refills | Status: DC

## 2021-06-06 MED ORDER — FREESTYLE LIBRE 2 READER DEVI: 1.0000 | 0 refills | Status: DC

## 2021-06-06 NOTE — Telephone Encounter (Signed)
Orders entered for the addition liver testing. Lab orders in so she can drop by the nearest [facility] to have that done. Also ordered an US of the RUQ for further evaluation. She should receive a call to schedule that.   ___________________________________________ Thayer Ohm, DNP, APRN, FNP-BC Primary Care and Sports Medicine [facility] [place]

## 2021-06-06 NOTE — Telephone Encounter (Signed)
I ordered the Freestyle Lebre system.  Please address the further testing issue.  Tiajuana Amass, CMA

## 2021-06-20 MED ORDER — FREESTYLE LIBRE 3 SENSOR MISC: 1.0000 | 1 refills | Status: AC

## 2021-06-20 MED ORDER — CONTINUOUS GLUCOSE MONITOR SUP KIT: 1.0000 | PACK | 0 refills | Status: AC

## 2021-06-20 NOTE — Addendum Note (Signed)
Addended by: Stan Head on: 06/20/2021 04:48 PM   Modules accepted: Orders

## 2021-06-23 ENCOUNTER — Other Ambulatory Visit: Payer: Self-pay

## 2021-08-03 ENCOUNTER — Ambulatory Visit (INDEPENDENT_AMBULATORY_CARE_PROVIDER_SITE_OTHER): Payer: Self-pay | Admitting: Medical-Surgical

## 2021-08-03 ENCOUNTER — Encounter: Payer: Self-pay | Admitting: Medical-Surgical

## 2021-08-03 ENCOUNTER — Other Ambulatory Visit: Payer: Self-pay

## 2021-08-03 VITALS — BP 127/89 | HR 73 | Resp 20 | Ht 63.75 in | Wt 226.0 lb

## 2021-08-03 DIAGNOSIS — E1165 Type 2 diabetes mellitus with hyperglycemia: Secondary | ICD-10-CM

## 2021-08-03 DIAGNOSIS — D7589 Other specified diseases of blood and blood-forming organs: Secondary | ICD-10-CM

## 2021-08-03 LAB — POCT GLYCOSYLATED HEMOGLOBIN (HGB A1C): HbA1c, POC (controlled diabetic range): 5.2 % (ref 0.0–7.0)

## 2021-08-03 NOTE — Progress Notes (Signed)
°  HPI with pertinent ROS:   CC: Diabetes follow-up  HPI: Sara Simpson 36 year old female presenting today to follow-up on diabetes.  Her last A1c was done 3 months ago with a result of 10.6.  Today she reports that she has been checking her sugars regularly with fasting blood sugars between 90-130.  She has made tremendous strides on dietary modification and has lost approximately 20 pounds.  She is exercising regularly.  Taking metformin 500 mg daily as prescribed.  Tolerating the medication well but notes that she has had some issues with constipation recently.  She has tried a couple of things for constipation however has not had much luck.  Previously notes that she was not very regular with bowel movements but this is gotten worse.  She is now only having 1 bowel movement per week on average and that usually requires manual disimpaction.  Reviewed her labs on MyChart and had a question regarding her MCV being elevated.  This does look like its been elevated for at least 4 or 5 years.  She read that this could be caused by certain type of anemia and would like to investigate further.  Missed her appointment for the right upper quadrant ultrasound and did not get labs done as ordered for follow-up on elevated liver enzymes.  Would like to get that done today and look into getting rescheduled for the ultrasound.  I reviewed the past medical history, family history, social history, surgical history, and allergies today and no changes were needed.  Please see the problem list section below in epic for further details.   Physical exam:   General: Well Developed, well nourished, and in no acute distress.  Neuro: Alert and oriented x3.  HEENT: Normocephalic, atraumatic.  Skin: Warm and dry. Cardiac: Regular rate and rhythm, no murmurs rubs or gallops, no lower extremity edema.  Respiratory: Clear to auscultation bilaterally. Not using accessory muscles, speaking in full sentences.  Impression and  Recommendations:    1. Uncontrolled type 2 diabetes mellitus with hyperglycemia (HCC) POCT hemoglobin A1c down to 5.2% today.  Continue metformin 500 mg daily as this seems to be helping with weight loss as well.  Continue diabetic diet modifications and regular intentional exercise with a goal for weight loss. - POCT glycosylated hemoglobin (Hb A1C)  2. Macrocytosis without anemia Checking B12 and folate today in addition to updating labs from elevated liver enzymes that were previously ordered in December. - B12 and Folate Panel  Advised patient to head downstairs to imaging to get rescheduled for her ultrasound since the order is still active.  Return in about 6 months (around 01/31/2022) for DM follow up. ___________________________________________ Clearnce Sorrel, DNP, APRN, FNP-BC Primary Care and [place]

## 2021-08-04 LAB — B12 AND FOLATE PANEL
Folate: 13.3 ng/mL
Vitamin B-12: 439 pg/mL (ref 200–1100)

## 2021-08-04 LAB — HEPATITIS C ANTIBODY
Hepatitis C Ab: NONREACTIVE
SIGNAL TO CUT-OFF: 0.07 (ref <1.00)

## 2021-08-04 LAB — CP4508-PT/INR AND PTT
INR: 1
Prothrombin Time: 10.6 s (ref 9.0–11.5)
aPTT: 28 s (ref 23–32)

## 2021-08-04 LAB — GAMMA GT: GGT: 80 U/L — ABNORMAL HIGH (ref 3–50)

## 2021-08-04 LAB — HEPATITIS B SURFACE ANTIGEN: Hepatitis B Surface Ag: NONREACTIVE

## 2021-08-04 LAB — FERRITIN: Ferritin: 154 ng/mL (ref 16–154)

## 2021-08-07 ENCOUNTER — Other Ambulatory Visit: Payer: Self-pay

## 2021-08-10 ENCOUNTER — Other Ambulatory Visit: Payer: Self-pay

## 2021-08-10 ENCOUNTER — Ambulatory Visit (INDEPENDENT_AMBULATORY_CARE_PROVIDER_SITE_OTHER): Payer: Self-pay

## 2021-08-10 DIAGNOSIS — R748 Abnormal levels of other serum enzymes: Secondary | ICD-10-CM

## 2021-08-10 DIAGNOSIS — R7401 Elevation of levels of liver transaminase levels: Secondary | ICD-10-CM

## 2021-08-11 ENCOUNTER — Encounter: Payer: Self-pay | Admitting: Medical-Surgical

## 2021-08-11 ENCOUNTER — Telehealth: Payer: Self-pay | Admitting: Medical-Surgical

## 2021-08-11 DIAGNOSIS — R932 Abnormal findings on diagnostic imaging of liver and biliary tract: Secondary | ICD-10-CM

## 2021-08-11 NOTE — Telephone Encounter (Signed)
Patient called in and would like a nurse to give her a call to explain her lab results. Please advise.

## 2021-08-11 NOTE — Telephone Encounter (Signed)
MyChart message from patient forwarded to provider for clarification on recent lab results. Thanks.

## 2021-08-18 ENCOUNTER — Other Ambulatory Visit: Payer: Self-pay

## 2021-08-18 ENCOUNTER — Encounter: Payer: Self-pay | Admitting: Gastroenterology

## 2021-08-18 ENCOUNTER — Other Ambulatory Visit (INDEPENDENT_AMBULATORY_CARE_PROVIDER_SITE_OTHER): Payer: Self-pay

## 2021-08-18 ENCOUNTER — Ambulatory Visit (INDEPENDENT_AMBULATORY_CARE_PROVIDER_SITE_OTHER): Payer: Self-pay | Admitting: Gastroenterology

## 2021-08-18 VITALS — BP 138/80 | HR 85 | Ht 63.75 in | Wt 228.2 lb

## 2021-08-18 DIAGNOSIS — E119 Type 2 diabetes mellitus without complications: Secondary | ICD-10-CM

## 2021-08-18 DIAGNOSIS — R7401 Elevation of levels of liver transaminase levels: Secondary | ICD-10-CM

## 2021-08-18 DIAGNOSIS — K760 Fatty (change of) liver, not elsewhere classified: Secondary | ICD-10-CM

## 2021-08-18 DIAGNOSIS — E669 Obesity, unspecified: Secondary | ICD-10-CM

## 2021-08-18 DIAGNOSIS — Z6839 Body mass index (BMI) 39.0-39.9, adult: Secondary | ICD-10-CM

## 2021-08-18 NOTE — Progress Notes (Signed)
Chief Complaint: Elevated liver enzymes, abnormal ultrasound   Referring Provider:     Samuel Bouche, NP   HPI:     Sara Simpson is a 36 y.o. female with a history of diabetes referred to the Gastroenterology Clinic for evaluation of elevated liver enzymes.  - 02/10/2019: AST/ALT 54/102.  Negative acute viral hepatitis panel - 01/29/2019: RUQ [place]: Echogenic liver c/w fatty liver disease - 02/24/2019: AST/ALT 42/76 with normal ALP and T. bili - 09/18/2019: AST/ALT 16/13 with normal ALP and T. bili - 06/02/2021: AST/ALT 144/220, ALP 62, T. bili 0.8, albumin 4.3.  Otherwise normal BMP, TSH - 08/03/2021: GGT 80. HBsAg-, HCV Ab-.  INR 1.0 - 08/10/2021: RUQ [place]: Heterogenous liver with newly lobulated surface.  No duct dilation and normal PV.   Liver enzymes have been elevated with ALT>AST since at least 2017.  They had normalized in 08/2019 as outlined above, but subsequently increased again in 05/2021.  Other than being told she has fatty liver disease in the past, she has no known liver disease/dysfunction.  No history of jaundice, icteric sclera, ascites consider.  Does have a history of macrocytosis without anemia with most recent H/H 14.6/41.4 with MCV/RDW 102.7/11.9.  Otherwise normal WBC and PLT's. - Ferritin 154, B12 439, folate 13.3.  Rest of iron panel was not completed for some reason  Diagnosed with DM in fall 2022. Since diagnosis, has been checking blood sugar regularly at home, made dietary modifications and exercising regularly, with approximately 20# weight loss. Most recent hemoglobin A1c was 8.2% (from 10.6 in 04/2021).   No known family history of CRC, GI malignancy, liver disease, pancreatic disease, or IBD.    Past Medical History:  Diagnosis Date   Anxiety    Asthma    Eczema    History of anemia    Hypertension    Palpitations    Transaminitis      Past Surgical History:  Procedure Laterality Date   TUBAL LIGATION     Family History  Problem  Relation Age of Onset   Diabetes Mother    Thyroid disease Sister    Diabetes Maternal Grandmother    Heart attack Maternal Grandfather    Diabetes Maternal Grandfather    Cancer Maternal Grandfather    Asthma Son    Diabetes Maternal Aunt    Heart Problems Maternal Aunt    Colon cancer Neg Hx    Rectal cancer Neg Hx    Stomach cancer Neg Hx    Social History   Tobacco Use   Smoking status: Never   Smokeless tobacco: Never  Vaping Use   Vaping Use: Never used  Substance Use Topics   Alcohol use: Yes    Comment: 2 to 3 times a week liquor   Drug use: Never   Current Outpatient Medications  Medication Sig Dispense Refill   albuterol (VENTOLIN HFA) 108 (90 Base) MCG/ACT inhaler Inhale 2 puffs into the lungs every 6 (six) hours as needed for wheezing. 6.7 g 5   blood glucose meter kit and supplies KIT Dispense based on patient and insurance preference. Use up to four times daily as directed. Please include lancets, test strips, control solution. 1 each 0   Continuous Blood Gluc Receiver (FREESTYLE LIBRE 2 READER) DEVI 1 each by Does not apply route every 14 (fourteen) days. 1 each 0   Continuous Blood Gluc Sensor (FREESTYLE LIBRE 3 SENSOR) MISC 1 each by Does  not apply route every 14 (fourteen) days. 6 each 1   Continuous Glucose Monitor Sup KIT 1 each by Does not apply route continuous. 1 kit 0   hydrOXYzine (ATARAX) 10 MG tablet Take 10 mg by mouth as needed.     metFORMIN (GLUCOPHAGE XR) 500 MG 24 hr tablet Take 1 tablet (500 mg total) by mouth daily with breakfast. 30 tablet 11   No current facility-administered medications for this visit.   Allergies  Allergen Reactions   Penicillins Itching and Other (See Comments)    Unknown, childhood reaction   Fluoxetine Other (See Comments) and Itching     Review of Systems: All systems reviewed and negative except where noted in HPI.     Physical Exam:    Wt Readings from Last 3 Encounters:  08/18/21 228 lb 4 oz (103.5  kg)  08/03/21 226 lb (102.5 kg)  06/02/21 246 lb 8 oz (111.8 kg)    BP 138/80    Pulse 85    Ht 5' 3.75" (1.619 m)    Wt 228 lb 4 oz (103.5 kg)    SpO2 99%    BMI 39.49 kg/m  Constitutional:  Pleasant, in no acute distress. Psychiatric: Normal mood and affect. Behavior is normal. Cardiovascular: Normal rate, regular rhythm. No edema Pulmonary/chest: Effort normal and breath sounds normal. No wheezing, rales or rhonchi. Abdominal: Soft, nondistended, nontender. Bowel sounds active throughout. There are no masses palpable. No hepatomegaly. Neurological: Alert and oriented to person place and time. Skin: Skin is warm and dry. No rashes noted.   ASSESSMENT AND PLAN;   1) Elevated ALT > AST 2) Fatty liver disease on imaging 3) Diabetes 4) Obesity (BMI 39.5)  Sara Simpson is a 36 y.o. female presenting to the Gastroenterology Clinic with elevated ALT>AST and a RUQ [place] that was significant for fatty liver infiltration. Given these findings, her body habitus (overweight) and history of metabolic syndrome comorbidities (DM), favor NAFLD as the underlying etiology for elevated LAEs. However, given duration of elevated enzymes and elevated patient concerns, she would like to proceed with r/o other causes for chronic liver disease, to include Autoimmune hepatitis, Wilson Disease, Hemochromatosis, Viral hepatitis, A1AT deficiency, PSC, and PBC.  - Recent labs otherwise demonstrated preserved hepatic synthetic function with normal PLT, albumin, T. bili, creatinine, INR - AMA, ANA, ASMA, A1AT, Hepatitis A, B, C evaluation, ferritin, iron panel, ceruloplasmin, LKM Ab  - Evaluate for Celiac with tTG  - Immunoglobulin level - Send Fib-4 testing - Ultrasound elastography  -Recently updated guidelines from the AGA recommends patients with steatosis on imaging and/or elevated aminotransferases undergo noninvasive testing for fibrosis.  Patients with Fib-4 score <1.3 or likely low risk for rapid  progression of fibrosis can repeat noninvasive testing every 2-3 years - Given the presumed diagnosis of fatty liver disease, I extensively counseled the patient on the importance of diet and exercise, with a modest weight loss of 10% of total body weight, done slowly over weeks to months.  She has already started a diet/exercise plan, and I encouraged her to continue that today - Recommended low-fat/cholesterol/carbohydrate diet, restrict calories - To follow-up with me in 3-6 months or sooner prn    Sara Bullion, DO, FACG  08/18/2021, 1:41 PM   Samuel Bouche, NP

## 2021-08-18 NOTE — Patient Instructions (Addendum)
If you are age 37 or older, your body mass index should be between 23-30. Your Body mass index is 39.49 kg/m. If this is out of the aforementioned range listed, please consider follow up with your Primary Care Provider.  If you are age 46 or younger, your body mass index should be between 19-25. Your Body mass index is 39.49 kg/m. If this is out of the aformentioned range listed, please consider follow up with your Primary Care Provider.   __________________________________________________________  The [place] [facility] providers would like to encourage you to use [facility] to communicate with providers for non-urgent requests or questions.  Due to long hold times on the telephone, sending your provider a message by [facility] may be a faster and more efficient way to get a response.  Please allow 48 business hours for a response.  Please remember that this is for non-urgent requests.   Please go to the lab on the 2nd floor [address] before you leave the office today.   You will receive a call from Central scheduling at [phone] to schedule an [place] elastography.  Thank you for choosing me and [facility]

## 2021-08-20 LAB — FIB-4 W/REFLEX NASH FIBROSURE
ALT: 49 IU/L — ABNORMAL HIGH (ref 0–32)
AST: 39 IU/L (ref 0–40)
FIB-4 Index: 0.78 (ref 0.00–2.67)
Platelets: 258 10*3/uL (ref 150–450)

## 2021-08-21 LAB — IRON, TOTAL/TOTAL IRON BINDING CAP
%SAT: 29 % (calc) (ref 16–45)
Iron: 105 ug/dL (ref 40–190)
TIBC: 367 mcg/dL (calc) (ref 250–450)

## 2021-08-21 LAB — ALPHA-1-ANTITRYPSIN: A-1 Antitrypsin, Ser: 208 mg/dL — ABNORMAL HIGH (ref 83–199)

## 2021-08-21 LAB — HEPATIC FUNCTION PANEL
AG Ratio: 1.2 (calc) (ref 1.0–2.5)
ALT: 48 U/L — ABNORMAL HIGH (ref 6–29)
AST: 33 U/L — ABNORMAL HIGH (ref 10–30)
Albumin: 4.2 g/dL (ref 3.6–5.1)
Alkaline phosphatase (APISO): 58 U/L (ref 31–125)
Bilirubin, Direct: 0.2 mg/dL (ref 0.0–0.2)
Globulin: 3.6 g/dL (calc) (ref 1.9–3.7)
Indirect Bilirubin: 0.4 mg/dL (calc) (ref 0.2–1.2)
Total Bilirubin: 0.6 mg/dL (ref 0.2–1.2)
Total Protein: 7.8 g/dL (ref 6.1–8.1)

## 2021-08-21 LAB — IGA: Immunoglobulin A: 651 mg/dL — ABNORMAL HIGH (ref 47–310)

## 2021-08-21 LAB — CERULOPLASMIN: Ceruloplasmin: 30 mg/dL (ref 18–53)

## 2021-08-21 LAB — MITOCHONDRIAL ANTIBODIES: Mitochondrial M2 Ab, IgG: <=20 U (ref <=20.0)

## 2021-08-21 LAB — ANTI-NUCLEAR AB-TITER (ANA TITER): ANA Titer 1: 1:80 {titer} — ABNORMAL HIGH

## 2021-08-21 LAB — IGG: IgG (Immunoglobin G), Serum: 1640 mg/dL (ref 600–1640)

## 2021-08-21 LAB — TISSUE TRANSGLUTAMINASE, IGA: (tTG) Ab, IgA: <1 U/mL

## 2021-08-21 LAB — ANTI-SMOOTH MUSCLE ANTIBODY, IGG: Actin (Smooth Muscle) Antibody (IGG): <20 U (ref <20)

## 2021-08-21 LAB — IGM: IgM, Serum: 91 mg/dL (ref 50–300)

## 2021-08-21 LAB — ANA: Anti Nuclear Antibody (ANA): POSITIVE — AB

## 2021-08-21 LAB — HEPATITIS B SURFACE ANTIBODY,QUALITATIVE: Hep B S Ab: NONREACTIVE

## 2021-08-24 ENCOUNTER — Other Ambulatory Visit: Payer: Self-pay

## 2021-08-24 DIAGNOSIS — R7401 Elevation of levels of liver transaminase levels: Secondary | ICD-10-CM

## 2021-08-24 DIAGNOSIS — K760 Fatty (change of) liver, not elsewhere classified: Secondary | ICD-10-CM

## 2021-09-11 ENCOUNTER — Other Ambulatory Visit: Payer: Self-pay

## 2021-09-11 ENCOUNTER — Other Ambulatory Visit (INDEPENDENT_AMBULATORY_CARE_PROVIDER_SITE_OTHER): Payer: Self-pay

## 2021-09-11 ENCOUNTER — Ambulatory Visit (HOSPITAL_COMMUNITY)
Admission: RE | Admit: 2021-09-11 | Discharge: 2021-09-11 | Disposition: A | Discharge status: Home / Self Care | Payer: Self-pay | Source: Ambulatory Visit | Attending: Gastroenterology | Admitting: Gastroenterology

## 2021-09-11 DIAGNOSIS — R7401 Elevation of levels of liver transaminase levels: Secondary | ICD-10-CM

## 2021-09-11 DIAGNOSIS — K760 Fatty (change of) liver, not elsewhere classified: Secondary | ICD-10-CM | POA: Insufficient documentation

## 2021-09-15 ENCOUNTER — Other Ambulatory Visit: Payer: Self-pay

## 2021-09-15 ENCOUNTER — Encounter: Payer: Self-pay | Admitting: Gastroenterology

## 2021-09-15 ENCOUNTER — Ambulatory Visit (INDEPENDENT_AMBULATORY_CARE_PROVIDER_SITE_OTHER): Payer: Self-pay | Admitting: Gastroenterology

## 2021-09-15 VITALS — BP 120/80 | HR 100 | Ht 64.0 in | Wt 223.0 lb

## 2021-09-15 DIAGNOSIS — R7401 Elevation of levels of liver transaminase levels: Secondary | ICD-10-CM

## 2021-09-15 DIAGNOSIS — E669 Obesity, unspecified: Secondary | ICD-10-CM

## 2021-09-15 DIAGNOSIS — Z6839 Body mass index (BMI) 39.0-39.9, adult: Secondary | ICD-10-CM

## 2021-09-15 DIAGNOSIS — E119 Type 2 diabetes mellitus without complications: Secondary | ICD-10-CM

## 2021-09-15 DIAGNOSIS — K760 Fatty (change of) liver, not elsewhere classified: Secondary | ICD-10-CM

## 2021-09-15 LAB — ANTI-MICROSOMAL ANTIBODY LIVER / KIDNEY: LKM1 Ab: <=20 U (ref <=20.0)

## 2021-09-15 NOTE — Progress Notes (Signed)
? ?Chief Complaint:    Elevated liver enzymes, follow-up on recent imaging studies and labs. ? ?GI History: 36 year old female with a history of diabetes initially seen in the GI clinic on 08/18/2021 for evaluation of elevated liver enzymes.  Work-up to date notable for the following: ? ?- 02/10/2019: AST/ALT 54/102.  Negative acute viral hepatitis panel ?- 01/29/2019: RUQ [place]: Echogenic liver c/w fatty liver disease ?- 02/24/2019: AST/ALT 42/76 with normal ALP and T. bili ?- 09/18/2019: AST/ALT 16/13 with normal ALP and T. bili ?- 06/02/2021: AST/ALT 144/220, ALP 62, T. bili 0.8, albumin 4.3.  Otherwise normal BMP, TSH ?- 08/03/2021: GGT 80. HBsAg-, HCV Ab-.  INR 1.0 ?- 08/10/2021: RUQ [place]: Heterogenous liver with newly lobulated surface.  No duct dilation and normal PV. ?- 08/18/2021: AST/ALT 33/48.  ANA + at 1:80, IgA 651, Fib-4 score 0.78 (conveys low risk for advanced liver fibrosis).  Otherwise normal IgG, IgM, ceruloplasmin, alpha-1 antitrypsin, ASMA, AMA, anti-LKM, TTG, iron panel.  HBsAb- (Needs hepatitis B vaccine series) ?- 09/11/2021: Ultrasound elastography: Normal liver on ultrasound.  Median kPa 3.6 (high probability of being normal) ? ? ?Has decreased Hgb A1c from 10.6 --> 8.2% since diagnosis in 04/2021. ? ? ?Does have a history of macrocytosis without anemia with most recent H/H 14.6/41.4 with MCV/RDW 102.7/11.9.  Otherwise normal WBC and PLT's. ?- Ferritin 154, B12 439, folate 13.3.  Rest of iron panel was not completed for some reason ? ?HPI:   ? ? ?Patient is a 36 y.o. female presenting to the Gastroenterology Clinic for follow-up and to review results of recent labs and imaging studies as outlined above.  She is otherwise in her usual state of health and without any symptoms. ? ?She does drink 2 glasses wine/night and vodka on weekends.  Otherwise, has been modifying her diet and taking medications as scheduled since diagnosis of diabetes, with intentional weight loss and downtrending hemoglobin A1c as  outlined above. ? ?Liver enzymes have decreased on repeat last month. ? ? ?Review of systems:     No chest pain, no SOB, no fevers, no urinary sx  ? ?Past Medical History:  ?Diagnosis Date  ? Anxiety   ? Asthma   ? Eczema   ? History of anemia   ? Hypertension   ? Palpitations   ? Transaminitis   ? ? ?Patient's surgical history, family medical history, social history, medications and allergies were all reviewed in Epic  ? ? ?Current Outpatient Medications  ?Medication Sig Dispense Refill  ? albuterol (VENTOLIN HFA) 108 (90 Base) MCG/ACT inhaler Inhale 2 puffs into the lungs every 6 (six) hours as needed for wheezing. 6.7 g 5  ? blood glucose meter kit and supplies KIT Dispense based on patient and insurance preference. Use up to four times daily as directed. Please include lancets, test strips, control solution. 1 each 0  ? Continuous Blood Gluc Receiver (FREESTYLE LIBRE 2 READER) DEVI 1 each by Does not apply route every 14 (fourteen) days. 1 each 0  ? Continuous Blood Gluc Sensor (FREESTYLE LIBRE 3 SENSOR) MISC 1 each by Does not apply route every 14 (fourteen) days. 6 each 1  ? Continuous Glucose Monitor Sup KIT 1 each by Does not apply route continuous. 1 kit 0  ? hydrOXYzine (ATARAX) 10 MG tablet Take 10 mg by mouth as needed.    ? metFORMIN (GLUCOPHAGE XR) 500 MG 24 hr tablet Take 1 tablet (500 mg total) by mouth daily with breakfast. 30 tablet 11  ? ?No  current facility-administered medications for this visit.  ? ? ?Physical Exam:   ? ? ?BP 120/80   Pulse 100   Ht 5' 4"  (1.626 m)   Wt 223 lb (101.2 kg)   BMI 38.28 kg/m?  ? ?GENERAL:  Pleasant, in NAD ?PSYCH: : Cooperative, normal affect ?Musculoskeletal:  Normal muscle tone, normal strength ?NEURO: Alert and oriented x 3, no focal neurologic deficits ? ? ?IMPRESSION and PLAN:   ? ?1) Elevated ALT > AST ?2) Fatty liver disease on imaging ?3) Diabetes ?4) Obesity (BMI 39.5) ?5) Diabetes ?6) Hypertension ? ?Extended serologic evaluation with positive ANA at  1:80.  Discussed possibility of Autoimmune Hepatitis, but the remainder of the serologic markers for AIH are otherwise normal.  Interestingly, her ultrasound and elastography were otherwise normal, and liver enzymes have decreased.  Elevated IgA potentially related to EtOH consumption.  My clinical suspicion is more so of fatty liver disease with incidentally positive ANA.  We discussed 1) observation, 2) liver biopsy, 3) referral to the Hepatology Clinic.  After careful consideration, she would like to proceed with option #3 ? ?- Repeat liver enzymes 6 months ?- Continue aggressive management of diabetes to include continued intentional weight loss through dietary modification and increased activity ?- Decrease EtOH consumption ?- Repeat fib 4 testing in 2 years ?- Referral to [facility] at [facility] for further evaluation ? ? ?RTC in 6 months or sooner as needed ? ?I spent 30 minutes of time, including in depth chart review, independent review of results as outlined above, communicating results with the patient directly, face-to-face time with the patient, coordinating care, and ordering studies and medications as appropriate, and documentation. ?  ?    ? ?Lavena Bullion ,DO, FACG 09/15/2021, 11:08 AM ? ?

## 2021-09-15 NOTE — Patient Instructions (Addendum)
If you are age 37 or older, your body mass index should be between 23-30. Your Body mass index is 38.28 kg/m?Sara Simpson If this is out of the aforementioned range listed, please consider follow up with your Primary Care Provider. ? ?If you are age 11 or younger, your body mass index should be between 19-25. Your Body mass index is 38.28 kg/m?Sara Simpson If this is out of the aformentioned range listed, please consider follow up with your Primary Care Provider.  ? ?________________________________________________________ ? ?The [place] [facility] providers would like to encourage you to use [facility] to communicate with providers for non-urgent requests or questions.  Due to long hold times on the telephone, sending your provider a message by [facility] may be a faster and more efficient way to get a response.  Please allow 48 business hours for a response.  Please remember that this is for non-urgent requests.  ?_______________________________________________________ ? ?Your provider has requested that you go to the basement level for lab work in 6 months at [address] [place]. Please go after 03-18-2022 Press "B" on the elevator. The lab is located at the first door on the left as you exit the elevator. ? ?A referral will be sent to [facility] at [facility]. Please call in 3 weeks if you haven't heard anything ? ?Please schedule an office visit in 6 months. ? ?It was a pleasure to see you today! ? ?[name], D.O. ? ? ? ? ? ? ?We want to thank you for trusting  [facility] with your care. All of our staff and providers value the relationships we have built with our patients, and it is an honor to care for you.  ? ?We are writing to let you know that [facility] will close on Nov 06, 2021, and we invite you to continue to see Dr. Edman Circle and [name] at the [facility] [place] office location. We are consolidating our serices at these [facility] practices to  better provide care. Our office staff will work with you to ensure a seamless transition.  ? ?[name], DO -Dr. Barron Alvine will be movig to [facility] at [address], [place], [place] [address], effective Nov 06, 2021.  Contact [phone] to schedule an appointment with him.  ? ?Edman Circle, MD- Dr. Chales Abrahams will be movig to [facility] at [address] [place], [place] [address], effective Nov 06, 2021.  Contact [phone] to schedule an appointment with him.  ? ?Requesting Medical Records ?If you need to request your medical records, please follow the instructions below. Your medical records are confidential, and a copy can be transferred to another provider or released to you or another person you designate only with your permission. ? ?There are several ways to request your medical records: ?Requests for medical records can be submitted through our practice.   ?You can also request your records electronically, in your MyChart account by selecting the ?Request Health Records? tab.  ?If you need additional information on how to request records, please go to [email or web address], choose Patient Information, then select Request Medical Records. ?To make an appointment or if you have any questions about your health care needs, please contact our office at [phone] and one of our staff members will be glad to assist you. ?[facility] is committed to providing exceptional care for you and our community. Thank you for allowing [place] to serve your health care needs. ?Sincerely, ? ?Trixie Dredge, Director  [facility] ?[facility] also offers convenient virtual care options. Sore throat? Sinus problems? Cold or flu symptoms? Get care from the comfort of home with [facility] Video Visits and e-Visits. Learn more about the non-emergency conditions treated and start your virtual visit at [email or web address] ? ?

## 2021-09-19 ENCOUNTER — Telehealth: Payer: Self-pay

## 2021-09-19 NOTE — Telephone Encounter (Signed)
Referral to Annamarie Major, NP for elevated LAE and positive ANA sent successfully to [phone]. Phone number [phone] ?

## 2021-09-22 NOTE — Telephone Encounter (Signed)
[facility] from [facility] and they stated they have tried to call the patient but unable to make contact and a letter has been sent to her as well ?

## 2021-10-30 ENCOUNTER — Telehealth: Payer: Self-pay | Admitting: General Practice

## 2021-10-30 NOTE — Telephone Encounter (Signed)
Transition Care Management Follow-up Telephone Call ?Date of discharge and from where: 10/29/21 from [facility] ?How have you been since you were released from the hospital? She has a cyst on her ovary and was advised to follow up with GYN and cardiologist (due to elevated BP/HR). Scheduled to see Samuel Bouche, NP, 11/02/21 at 1110. ?Any questions or concerns? No ? ?Items Reviewed: ?Did the pt receive and understand the discharge instructions provided? Yes  ?Medications obtained and verified? No  ?Other? No  ?Any new allergies since your discharge? No  ?Dietary orders reviewed? No ?Do you have support at home? Yes  ? ?Home Care and Equipment/Supplies: ?Were home health services ordered? no ? ?Functional Questionnaire: (I = Independent and D = Dependent) ?ADLs: I ? ?Bathing/Dressing- I ? ?Meal Prep- I ? ?Eating- I ? ?Maintaining continence- I ? ?Transferring/Ambulation- I ? ?Managing Meds- I ? ?Follow up appointments reviewed: ? ?PCP Hospital f/u appt confirmed? Yes  Scheduled to see Samuel Bouche, NP on 11/02/21 @ 1110. ?[facility] f/u appt confirmed? No   ?Are transportation arrangements needed? No  ?If their condition worsens, is the pt aware to call PCP or go to the Emergency Dept.? Yes ?Was the patient provided with contact information for the PCP's office or ED? Yes ?Was to pt encouraged to call back with questions or concerns? Yes  ?

## 2021-11-01 ENCOUNTER — Inpatient Hospital Stay: Payer: Self-pay | Admitting: Medical-Surgical

## 2021-11-01 NOTE — Progress Notes (Signed)
   Complete physical exam  Patient: Sara Simpson   DOB: 04/14/2000   36 y.o. Female  MRN: [id number]  Subjective:    No chief complaint on file.   Sara Simpson is a 36 y.o. female who presents today for a complete physical exam. She reports consuming a {diet types:17450} diet. {types:19826} She generally feels {DESC; WELL/FAIRLY WELL/POORLY:18703}. She reports sleeping {DESC; WELL/FAIRLY WELL/POORLY:18703}. She {does/does not:200015} have additional problems to discuss today.    Most recent fall risk assessment:    12/20/2021   10:42 AM  Fall Risk   Falls in the past year? 0  Number falls in past yr: 0  Injury with Fall? 0  Risk for fall due to : No Fall Risks  Follow up Falls evaluation completed     Most recent depression screenings:    12/20/2021   10:42 AM 11/10/2020   10:46 AM  PHQ 2/9 Scores  PHQ - 2 Score 0 0  PHQ- 9 Score 5     {VISON DENTAL STD PSA (Optional):27386}  {History (Optional):23778}  Patient Care Team: Jourden Delmont, NP as PCP - General (Nurse Practitioner)   Outpatient Medications Prior to Visit  Medication Sig   fluticasone (FLONASE) 50 MCG/ACT nasal spray Place 2 sprays into both nostrils in the morning and at bedtime. After 7 days, reduce to once daily.   norgestimate-ethinyl estradiol (SPRINTEC 28) 0.25-35 MG-MCG tablet Take 1 tablet by mouth daily.   Nystatin POWD Apply liberally to affected area 2 times per day   spironolactone (ALDACTONE) 100 MG tablet Take 1 tablet (100 mg total) by mouth daily.   No facility-administered medications prior to visit.    ROS        Objective:     There were no vitals taken for this visit. {Vitals History (Optional):23777}  Physical Exam   No results found for any visits on 01/25/22. {Show previous labs (optional):23779}    Assessment & Plan:    Routine Health Maintenance and Physical Exam  Immunization History  Administered Date(s) Administered   DTaP 06/28/1999, 08/24/1999,  11/02/1999, 07/18/2000, 02/01/2004   Hepatitis A 11/28/2007, 12/03/2008   Hepatitis B 04/15/1999, 05/23/1999, 11/02/1999   HiB (PRP-OMP) 06/28/1999, 08/24/1999, 11/02/1999, 07/18/2000   IPV 06/28/1999, 08/24/1999, 04/22/2000, 02/01/2004   Influenza,inj,Quad PF,6+ Mos 03/05/2014   Influenza-Unspecified 06/04/2012   MMR 04/22/2001, 02/01/2004   Meningococcal Polysaccharide 12/03/2011   Pneumococcal Conjugate-13 07/18/2000   Pneumococcal-Unspecified 11/02/1999, 01/16/2000   Tdap 12/03/2011   Varicella 04/22/2000, 11/28/2007    Health Maintenance  Topic Date Due   HIV Screening  Never done   Hepatitis C Screening  Never done   INFLUENZA VACCINE  01/23/2022   PAP-Cervical Cytology Screening  01/25/2022 (Originally 04/13/2020)   PAP SMEAR-Modifier  01/25/2022 (Originally 04/13/2020)   TETANUS/TDAP  01/25/2022 (Originally 12/02/2021)   HPV VACCINES  Discontinued   COVID-19 Vaccine  Discontinued    Discussed health benefits of physical activity, and encouraged her to engage in regular exercise appropriate for her age and condition.  Problem List Items Addressed This Visit   None Visit Diagnoses     Annual physical exam    -  Primary   Cervical cancer screening       Need for Tdap vaccination          No follow-ups on file.     Ndea Kilroy, NP   

## 2021-11-02 ENCOUNTER — Ambulatory Visit (INDEPENDENT_AMBULATORY_CARE_PROVIDER_SITE_OTHER): Payer: Self-pay | Admitting: Medical-Surgical

## 2021-11-02 DIAGNOSIS — N83202 Unspecified ovarian cyst, left side: Secondary | ICD-10-CM

## 2021-11-02 DIAGNOSIS — I10 Essential (primary) hypertension: Secondary | ICD-10-CM

## 2021-11-02 DIAGNOSIS — Z91199 Patient's noncompliance with other medical treatment and regimen due to unspecified reason: Secondary | ICD-10-CM

## 2021-11-02 DIAGNOSIS — Z09 Encounter for follow-up examination after completed treatment for conditions other than malignant neoplasm: Secondary | ICD-10-CM

## 2021-11-08 ENCOUNTER — Encounter: Payer: Self-pay | Admitting: Medical-Surgical

## 2021-11-08 NOTE — Progress Notes (Signed)
Erroneous encounter. Please disregard.

## 2021-11-20 NOTE — Progress Notes (Unsigned)
   Established Patient Office Visit  Subjective   Patient ID: Sara Simpson, female   DOB: 07/19/1985 Age: 36 y.o. MRN: [id number]   No chief complaint on file.   HPI Pleasant 36 year old female presenting today for hospital discharge follow up and for the following:  Ovarian cyst:  Left rib pain:  HTN/elevated pulse rate:  ROS    Objective:    There were no vitals filed for this visit.   Physical Exam    No results found for this or any previous visit (from the past 24 hour(s)).   {Labs (Optional):23779}  The ASCVD Risk score (Arnett DK, et al., 2019) failed to calculate for the following reasons:   The 2019 ASCVD risk score is only valid for ages 40 to 26   Assessment & Plan:   No problem-specific Assessment & Plan notes found for this encounter.   No follow-ups on file.  ___________________________________________ Thayer Ohm, DNP, APRN, FNP-BC Primary Care and Sports Medicine [facility] [place]

## 2021-11-21 ENCOUNTER — Ambulatory Visit (INDEPENDENT_AMBULATORY_CARE_PROVIDER_SITE_OTHER): Payer: Self-pay | Admitting: Medical-Surgical

## 2021-11-21 DIAGNOSIS — F411 Generalized anxiety disorder: Secondary | ICD-10-CM

## 2021-11-21 DIAGNOSIS — I10 Essential (primary) hypertension: Secondary | ICD-10-CM

## 2021-11-21 DIAGNOSIS — Z91199 Patient's noncompliance with other medical treatment and regimen due to unspecified reason: Secondary | ICD-10-CM

## 2021-11-21 DIAGNOSIS — R7303 Prediabetes: Secondary | ICD-10-CM

## 2021-11-21 DIAGNOSIS — Z09 Encounter for follow-up examination after completed treatment for conditions other than malignant neoplasm: Secondary | ICD-10-CM

## 2021-11-21 DIAGNOSIS — Z8742 Personal history of other diseases of the female genital tract: Secondary | ICD-10-CM

## 2021-11-24 ENCOUNTER — Telehealth: Payer: Self-pay

## 2021-11-24 ENCOUNTER — Other Ambulatory Visit: Payer: Self-pay

## 2021-11-24 DIAGNOSIS — K760 Fatty (change of) liver, not elsewhere classified: Secondary | ICD-10-CM

## 2021-11-24 DIAGNOSIS — R7401 Elevation of levels of liver transaminase levels: Secondary | ICD-10-CM

## 2021-11-24 NOTE — Telephone Encounter (Signed)
Lab order placed. Pt verbalized understanding and had no other concerns at end of call.

## 2021-11-24 NOTE — Telephone Encounter (Signed)
-----   Message from Orion Modest, RN sent at 08/24/2021 11:21 AM EST ----- Regarding: labs Pt needs repeat liver enzymes. Need to enter order in epic.

## 2022-01-21 NOTE — Progress Notes (Unsigned)
Virtual Visit via Video Note  I connected with Sara Simpson on 01/21/22 at 11:10 AM EDT by a video enabled telemedicine application and verified that I am speaking with the correct person using two identifiers.   I discussed the limitations of evaluation and management by telemedicine and the availability of in person appointments. The patient expressed understanding and agreed to proceed.  Patient location: home Provider locations: office  Subjective:    CC: recurrent yeast infections  HPI: Pleasant 36 year old female presenting via MyChart video visit to discuss vaginal irritation and discharge.  Notes that for the last month she has had on and off issues with creamy white discharge that is sometimes thick but sometimes thin.  She has had some vaginal irritation that she treated with over-the-counter Monistat for 3 days.  She notes that her symptoms improved somewhat but have not fully resolved.  She is not currently having itching however she does have an odor that she describes as musty.  Reports that she is keeping her diabetes well controlled and is unsure why her symptoms have not fully resolved.  She has been treated for BV as well as yeast before and tolerated both medications for that well.  Past medical history, Surgical history, Family history not pertinant except as noted below, Social history, Allergies, and medications have been entered into the medical record, reviewed, and corrections made.   Review of Systems: See HPI for pertinent positives and negatives.   Objective:    General: Speaking clearly in complete sentences without any shortness of breath.  Alert and oriented x3.  Normal judgment. No apparent acute distress.  Impression and Recommendations:    1. Vaginal discharge Unable to do testing or exam due to virtual nature of visit.  We will go ahead and treat prophylactically with Diflucan 150 mg x 1 with a repeat dose in 72 hours if no full resolution.  Since she  does have a history of bacterial vaginosis, we will go ahead and also treat empirically with metronidazole 500 mg twice daily x7 days.  I discussed the assessment and treatment plan with the patient. The patient was provided an opportunity to ask questions and all were answered. The patient agreed with the plan and demonstrated an understanding of the instructions.   The patient was advised to call back or seek an in-person evaluation if the symptoms worsen or if the condition fails to improve as anticipated.  25 minutes of non-face-to-face time was provided during this encounter.  No follow-ups on file.  Thayer Ohm, DNP, APRN, FNP-BC [facility]

## 2022-01-22 ENCOUNTER — Telehealth (INDEPENDENT_AMBULATORY_CARE_PROVIDER_SITE_OTHER): Payer: Self-pay | Admitting: Medical-Surgical

## 2022-01-22 ENCOUNTER — Encounter: Payer: Self-pay | Admitting: Medical-Surgical

## 2022-01-22 DIAGNOSIS — B379 Candidiasis, unspecified: Secondary | ICD-10-CM

## 2022-01-22 DIAGNOSIS — N898 Other specified noninflammatory disorders of vagina: Secondary | ICD-10-CM

## 2022-01-22 MED ORDER — METRONIDAZOLE 500 MG PO TABS: 500.0000 mg | ORAL_TABLET | Freq: Two times a day (BID) | ORAL | 0 refills | Status: AC

## 2022-01-22 MED ORDER — FLUCONAZOLE 150 MG PO TABS: 150.0000 mg | ORAL_TABLET | Freq: Once | ORAL | 1 refills | Status: AC

## 2022-01-31 DIAGNOSIS — E119 Type 2 diabetes mellitus without complications: Secondary | ICD-10-CM | POA: Insufficient documentation

## 2022-01-31 NOTE — Progress Notes (Unsigned)
   Established Patient Office Visit  Subjective   Patient ID: Sara Simpson, female   DOB: 1986-03-07 Age: 36 y.o. MRN: [id number]   No chief complaint on file.   HPI Pleasant 36 year old female presenting today for follow-up on:  Diabetes:  Hypertension:    Objective:    There were no vitals filed for this visit.   Physical Exam    No results found for this or any previous visit (from the past 24 hour(s)).   {Labs (Optional):23779}  The ASCVD Risk score (Arnett DK, et al., 2019) failed to calculate for the following reasons:   The 2019 ASCVD risk score is only valid for ages 56 to 72   Assessment & Plan:   No problem-specific Assessment & Plan notes found for this encounter.   No follow-ups on file.  ___________________________________________ Thayer Ohm, DNP, APRN, FNP-BC Primary Care and Sports Medicine [facility] [place]

## 2022-02-01 ENCOUNTER — Ambulatory Visit (INDEPENDENT_AMBULATORY_CARE_PROVIDER_SITE_OTHER): Payer: Self-pay | Admitting: Medical-Surgical

## 2022-02-01 ENCOUNTER — Encounter: Payer: Self-pay | Admitting: Medical-Surgical

## 2022-02-01 VITALS — BP 114/75 | HR 90 | Resp 20 | Ht 64.0 in | Wt 208.7 lb

## 2022-02-01 DIAGNOSIS — E119 Type 2 diabetes mellitus without complications: Secondary | ICD-10-CM

## 2022-02-01 DIAGNOSIS — N83201 Unspecified ovarian cyst, right side: Secondary | ICD-10-CM

## 2022-02-01 DIAGNOSIS — N63 Unspecified lump in unspecified breast: Secondary | ICD-10-CM

## 2022-02-01 DIAGNOSIS — I10 Essential (primary) hypertension: Secondary | ICD-10-CM

## 2022-02-01 LAB — POCT GLYCOSYLATED HEMOGLOBIN (HGB A1C): HbA1c, POC (controlled diabetic range): 5.4 % (ref 0.0–7.0)

## 2022-02-01 LAB — HEMOGLOBIN A1C: Hemoglobin A1C: 5.4

## 2022-02-02 LAB — COMPLETE METABOLIC PANEL WITH GFR
AG Ratio: 1.1 (calc) (ref 1.0–2.5)
ALT: 87 U/L — ABNORMAL HIGH (ref 6–29)
AST: 93 U/L — ABNORMAL HIGH (ref 10–30)
Albumin: 4 g/dL (ref 3.6–5.1)
Alkaline phosphatase (APISO): 65 U/L (ref 31–125)
BUN: 11 mg/dL (ref 7–25)
CO2: 23 mmol/L (ref 20–32)
Calcium: 9.5 mg/dL (ref 8.6–10.2)
Chloride: 104 mmol/L (ref 98–110)
Creat: 0.54 mg/dL (ref 0.50–0.97)
Globulin: 3.7 g/dL (calc) (ref 1.9–3.7)
Glucose, Bld: 170 mg/dL — ABNORMAL HIGH (ref 65–139)
Potassium: 4 mmol/L (ref 3.5–5.3)
Sodium: 138 mmol/L (ref 135–146)
Total Bilirubin: 0.4 mg/dL (ref 0.2–1.2)
Total Protein: 7.7 g/dL (ref 6.1–8.1)
eGFR: 122 mL/min/{1.73_m2} (ref >=60)

## 2022-02-02 LAB — LIPID PANEL
Cholesterol: 173 mg/dL (ref <200)
HDL: 79 mg/dL (ref >=50)
LDL Cholesterol (Calc): 69 mg/dL (calc)
Non-HDL Cholesterol (Calc): 94 mg/dL (calc) (ref <130)
Total CHOL/HDL Ratio: 2.2 (calc) (ref <5.0)
Triglycerides: 170 mg/dL — ABNORMAL HIGH (ref <150)

## 2022-02-02 LAB — CBC WITH DIFFERENTIAL/PLATELET
Absolute Monocytes: 409 cells/uL (ref 200–950)
Basophils Absolute: 40 cells/uL (ref 0–200)
Basophils Relative: 0.6 %
Eosinophils Absolute: 107 cells/uL (ref 15–500)
Eosinophils Relative: 1.6 %
HCT: 40 % (ref 35.0–45.0)
Hemoglobin: 14.4 g/dL (ref 11.7–15.5)
Lymphs Abs: 2687 cells/uL (ref 850–3900)
MCH: 37.5 pg — ABNORMAL HIGH (ref 27.0–33.0)
MCHC: 36 g/dL (ref 32.0–36.0)
MCV: 104.2 fL — ABNORMAL HIGH (ref 80.0–100.0)
MPV: 8.8 fL (ref 7.5–12.5)
Monocytes Relative: 6.1 %
Neutro Abs: 3457 cells/uL (ref 1500–7800)
Neutrophils Relative %: 51.6 %
Platelets: 273 10*3/uL (ref 140–400)
RBC: 3.84 10*6/uL (ref 3.80–5.10)
RDW: 12.5 % (ref 11.0–15.0)
Total Lymphocyte: 40.1 %
WBC: 6.7 10*3/uL (ref 3.8–10.8)

## 2022-02-07 ENCOUNTER — Other Ambulatory Visit: Payer: Self-pay

## 2022-02-07 ENCOUNTER — Telehealth: Payer: Self-pay

## 2022-02-07 DIAGNOSIS — N63 Unspecified lump in unspecified breast: Secondary | ICD-10-CM

## 2022-02-07 DIAGNOSIS — N6320 Unspecified lump in the left breast, unspecified quadrant: Secondary | ICD-10-CM

## 2022-02-07 NOTE — Telephone Encounter (Signed)
Patient called and stated that she was referred to [facility] per the [facility]. Patient complains of left breast mass, tenderness, in nipple area x 3 weeks, rates pain level 7 out of 10.  Patient initially had breast discharge (spontaneous)-beige and skin redness, both have subsided over the last 3 weeks. Patient denies currently or recently discontinuing breastfeeding as her daughter is 37 years old. Patient does qualify for BCCCP, information given to scheduler.

## 2022-02-21 ENCOUNTER — Telehealth: Payer: Self-pay | Admitting: *Deleted

## 2022-02-21 NOTE — Telephone Encounter (Signed)
No voicemail set up 

## 2022-03-20 ENCOUNTER — Ambulatory Visit
Admission: RE | Admit: 2022-03-20 | Discharge: 2022-03-20 | Disposition: A | Discharge status: Home / Self Care | Payer: Self-pay | Source: Ambulatory Visit | Attending: Obstetrics and Gynecology | Admitting: Obstetrics and Gynecology

## 2022-03-20 ENCOUNTER — Ambulatory Visit
Admission: RE | Admit: 2022-03-20 | Discharge: 2022-03-20 | Disposition: A | Discharge status: Home / Self Care | Payer: No Typology Code available for payment source | Source: Ambulatory Visit | Attending: Obstetrics and Gynecology | Admitting: Obstetrics and Gynecology

## 2022-03-20 ENCOUNTER — Other Ambulatory Visit (HOSPITAL_COMMUNITY)
Admission: RE | Admit: 2022-03-20 | Discharge: 2022-03-20 | Disposition: A | Discharge status: Home / Self Care | Payer: Managed Care, Other (non HMO) | Source: Ambulatory Visit | Attending: Obstetrics and Gynecology | Admitting: Obstetrics and Gynecology

## 2022-03-20 ENCOUNTER — Ambulatory Visit: Payer: Self-pay | Admitting: *Deleted

## 2022-03-20 ENCOUNTER — Other Ambulatory Visit: Payer: Self-pay | Admitting: Obstetrics and Gynecology

## 2022-03-20 VITALS — BP 160/98 | Wt 209.3 lb

## 2022-03-20 DIAGNOSIS — N6452 Nipple discharge: Secondary | ICD-10-CM

## 2022-03-20 DIAGNOSIS — N6310 Unspecified lump in the right breast, unspecified quadrant: Secondary | ICD-10-CM

## 2022-03-20 DIAGNOSIS — N6320 Unspecified lump in the left breast, unspecified quadrant: Secondary | ICD-10-CM

## 2022-03-20 DIAGNOSIS — N63 Unspecified lump in unspecified breast: Secondary | ICD-10-CM

## 2022-03-20 DIAGNOSIS — Z1239 Encounter for other screening for malignant neoplasm of breast: Secondary | ICD-10-CM

## 2022-03-20 NOTE — Progress Notes (Signed)
[name] is a 36 y.o. female who presents to [facility] clinic today with complaint of left breast lump, pain and discharge. Patient stated she first noticed a left breast lump one month ago that was painful at first that decreased in size and pain after 1-2 weeks. Patient rated the pain initially at a 7-8 out of 10 and stated it decreased to only pain when touched that she rates at a 4 out of 10 currently. Patient complained of bilateral clear to brownish tint spontaneous discharge. Patient stated she also had a right breast lump and pain one month ago that resolved within 1-2 weeks.    Pap Smear: Pap smear not completed today. Last Pap smear was 11/03/2019 at [facility] clinic in [place] and was normal with negative HPV. Per patient has history of an abnormal Pap smear 15 years that a repeat Pap smear was completed for follow up. Per patient has had at least three normal Pap smears since. Last Pap smear result is available in Epic.   Physical exam: Breasts Breasts symmetrical. No skin abnormalities bilateral breasts. No nipple retraction bilateral breasts. No nipple discharge right breast. Expressed a clear colored discharge from left nipple at site of piercing. Sent sample of discharge to Cytology for evaluation. No lymphadenopathy. No lumps palpated bilateral breasts. Unable to palpate a lump in patients area of concern. Complaints of left breast pain on exam between 2 o'clock-5 o'clock next to nipple at areola.       Pelvic/Bimanual Pap is not indicated today per BCCCP guidelines.   Smoking History: Patient is a former smoker that quit one month ago.   Patient Navigation: Patient education provided. Access to services provided for patient through BCCCP program.    Breast and Cervical Cancer Risk Assessment: Patient does not have family history of breast cancer, known genetic mutations, or radiation treatment to the chest before age 2. Patient does not have history of  cervical dysplasia, immunocompromised, or DES exposure in-utero.  Risk Assessment     Risk Scores       03/20/2022   Last edited by: Royston Bake, CMA   5-year risk: 0.3 %   Lifetime risk: 7.4 %            A: BCCCP exam without pap smear Complaint of breast lump, pain, and discharge.  P: Referred patient to the [facility] for a diagnostic mammogram. Appointment scheduled Tuesday, March 20, 2022 at 1520.  Loletta Parish, RN 03/20/2022 2:12 PM

## 2022-03-20 NOTE — Patient Instructions (Signed)
Explained breast self awareness with [name]. Patient did not need a Pap smear today due to last Pap smear and HPV Typing was 11/03/2019. Let her know BCCCP will cover Pap smears and HPV typing every 5 years unless has a history of abnormal Pap smears. Referred patient to the [facility] for a diagnostic mammogram. Appointment scheduled Tuesday, March 20, 2022 at 1520. Patient aware of appointment and will be there. Let patient know will follow up with her within the next couple weeks with results of her breast discharge by phone. Sara Simpson verbalized understanding.  Evalee Gerard, Arvil Chaco, RN 2:12 PM

## 2022-03-22 LAB — CYTOLOGY - NON PAP

## 2022-03-28 ENCOUNTER — Encounter: Payer: Self-pay | Admitting: Obstetrics & Gynecology

## 2022-03-29 ENCOUNTER — Telehealth: Payer: Self-pay

## 2022-03-29 NOTE — Telephone Encounter (Signed)
Via Lavon Paganini, Spanish Interpreter [facility]), Patient informed pathology results for left breast discharge is negative, no malignant cells seen. Patient verbalized understanding.

## 2022-03-29 NOTE — Progress Notes (Signed)
Hello Dr. Elly Modena,  What follow up do you recommend for her Breast Discharge results? Surgical Consult?  Thanks, [employer]

## 2022-04-19 ENCOUNTER — Encounter: Payer: Self-pay | Admitting: Obstetrics and Gynecology

## 2022-05-14 ENCOUNTER — Encounter: Payer: Self-pay | Admitting: Obstetrics and Gynecology

## 2022-05-14 NOTE — Progress Notes (Deleted)
   GYNECOLOGY OFFICE VISIT NOTE  History:   Sara Simpson is a 36 y.o. G*** here today for follow up from CT on 10/29/2021.   Her CT report is available but not her images. The report regarding it says: Reproductive: Bilateral tubal ligation clips. Within the left anterior pelvis is a 9.6 x 6.6 x 10.4 cm mass which contains fluid, fat, and calcifications. 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        Past Medical History:  Diagnosis Date   Anxiety    Asthma    Eczema    History of anemia    Hypertension    Palpitations    Transaminitis     Past Surgical History:  Procedure Laterality Date   TUBAL LIGATION      The following portions of the patient's history were reviewed and updated as appropriate: allergies, current medications, past family history, past medical history, past social history, past surgical history and problem list.   Health Maintenance:   Normal pap and negative HRHPV:   Diagnosis  Date Value Ref Range Status  11/03/2019   Final   - Negative for intraepithelial lesion or malignancy (NILM)    Review of Systems:  Pertinent items noted in HPI and remainder of comprehensive ROS otherwise negative.  Physical Exam:  There were no vitals taken for this visit. CONSTITUTIONAL: Well-developed, well-nourished female in no acute distress.  HEENT:  Normocephalic, atraumatic. External right and left ear normal. No scleral icterus.  NECK: Normal range of motion, supple, no masses noted on observation SKIN: No rash noted. Not diaphoretic. No erythema. No pallor. MUSCULOSKELETAL: Normal range of motion. No edema noted. NEUROLOGIC: Alert and oriented to person, place, and time. Normal muscle tone coordination. No cranial nerve deficit noted. PSYCHIATRIC: Normal mood and affect. Normal behavior. Normal judgment and thought content.  CARDIOVASCULAR: Normal heart rate noted RESPIRATORY: Effort and breath sounds normal, no problems with  respiration noted ABDOMEN: No masses noted. No other overt distention noted.    PELVIC: {Blank single:19197::"Deferred","Normal appearing external genitalia; normal urethral meatus; normal appearing vaginal mucosa and cervix.  No abnormal discharge noted.  Normal uterine size, no other palpable masses, no uterine or adnexal tenderness. Performed in the presence of a chaperone"}  Labs and Imaging No results found for this or any previous visit (from the past 168 hour(s)). No results found.  Assessment and Plan:   1. Left ovarian cyst ***    Diagnoses and all orders for this visit:  Left ovarian cyst    Routine preventative health maintenance measures emphasized. Please refer to After Visit Summary for other counseling recommendations.   No follow-ups on file.  Milas Hock, MD, FACOG Obstetrician & Gynecologist, [facility], [facility]

## 2022-05-23 ENCOUNTER — Other Ambulatory Visit: Payer: Self-pay | Admitting: Medical-Surgical

## 2022-06-06 ENCOUNTER — Encounter: Payer: Self-pay | Admitting: Medical-Surgical

## 2022-06-06 ENCOUNTER — Ambulatory Visit (INDEPENDENT_AMBULATORY_CARE_PROVIDER_SITE_OTHER): Payer: Self-pay | Admitting: Medical-Surgical

## 2022-06-06 VITALS — BP 124/75 | HR 87 | Resp 20 | Ht 64.0 in | Wt 201.1 lb

## 2022-06-06 DIAGNOSIS — Z202 Contact with and (suspected) exposure to infections with a predominantly sexual mode of transmission: Secondary | ICD-10-CM

## 2022-06-06 DIAGNOSIS — F411 Generalized anxiety disorder: Secondary | ICD-10-CM

## 2022-06-06 DIAGNOSIS — L309 Dermatitis, unspecified: Secondary | ICD-10-CM

## 2022-06-06 DIAGNOSIS — B351 Tinea unguium: Secondary | ICD-10-CM

## 2022-06-06 LAB — WET PREP FOR TRICH, YEAST, CLUE
MICRO NUMBER:: 14309675
Specimen Quality: ADEQUATE

## 2022-06-06 MED ORDER — CICLOPIROX 8 % EX SOLN: Freq: Every day | CUTANEOUS | 0 refills | Status: DC

## 2022-06-06 MED ORDER — HYDROXYZINE HCL 10 MG PO TABS: 10.0000 mg | ORAL_TABLET | Freq: Three times a day (TID) | ORAL | 5 refills | Status: DC | PRN

## 2022-06-06 MED ORDER — PREDNISONE 10 MG (48) PO TBPK: ORAL_TABLET | Freq: Every day | ORAL | 0 refills | Status: DC

## 2022-06-06 MED ORDER — SERTRALINE HCL 50 MG PO TABS: 50.0000 mg | ORAL_TABLET | Freq: Every day | ORAL | 3 refills | Status: DC

## 2022-06-06 NOTE — Progress Notes (Signed)
Established Patient Office Visit  Subjective   Patient ID: Sara Simpson, female   DOB: [birth year] Age: 36 y.o. MRN: [id number]   Chief Complaint  Patient presents with   Anxiety   Eczema   HPI Pleasant 36 year old female presenting today with complaints of a full body rash that she attributes to increased anxiety.  Previously treated general anxiety disorder with Zoloft but has been off of this for a while. Reports that she has had eczema most of her life and there have been episodes where rashes pop up in times of high stress. Managing her stress helped resolve the rash. Has been on and off Zoloft several times over the years. Reports this time, there were a few small spots that popped up on her skin in August but this has gradually increased and over the last 3 weeks, the rash now covers her extremities, trunk, and back. It is extremely itchy and she has not found anything that truly helps. Previously took hydroxyzine 10mg  TID prn and found that to work well for her. Has tried prednisone in the past and at times, the rash goes away but as soon as she stops the prednisone, it comes back full force, sometimes worse. Was told by her sister that she should look into the medication Dupixant to treat the eczema.   Has concerns about the toenail on the right great toe. It has pulled up from the nail bed on the medial distal corner. She has not trimmed the nail since she is worried about it becoming ingrown. Does not get pedicures and has had no exposure to any infectious agents to her knowledge. No injury to the toe.  Requesting full STI testing. Is sexually active and notes a possible exposure a while back. No current symptoms.     Objective:    Vitals:   06/06/22 1119  BP: 124/75  Pulse: 87  Resp: 20  Height: 5\' 4"  (1.626 m)  Weight: 201 lb 1.9 oz (91.2 kg)  SpO2: 98%  BMI (Calculated): 34.51    Physical Exam Vitals and nursing note reviewed.  Constitutional:      General:  She is not in acute distress.    Appearance: Normal appearance. She is obese. She is not ill-appearing.  HENT:     Head: Normocephalic and atraumatic.  Cardiovascular:     Rate and Rhythm: Normal rate and regular rhythm.     Pulses: Normal pulses.     Heart sounds: Normal heart sounds.  Pulmonary:     Effort: Pulmonary effort is normal. No respiratory distress.     Breath sounds: Normal breath sounds. No wheezing, rhonchi or rales.  Skin:    General: Skin is warm and dry.     Findings: Rash (generalized pathcy erythematous maculopapular rash to the extremities, trunk, and back.) present.  Neurological:     Mental Status: She is alert and oriented to person, place, and time.  Psychiatric:        Mood and Affect: Mood normal.        Behavior: Behavior normal.        Thought Content: Thought content normal.        Judgment: Judgment normal.    Results for orders placed or performed in visit on 06/06/22 (from the past 24 hour(s))  WET PREP FOR TRICH, YEAST, CLUE     Status: None   Collection Time: 06/06/22 12:03 PM   Specimen: Vaginal Fluid  Result Value Ref Range   MICRO  NUMBER: [id number]    Specimen Quality Adequate    SOURCE: NOT GIVEN    Status FINAL    RESULT      No Trichomonas vaginalis seen. No yeast seen No clue cells seen Epithelial Cells Present       The ASCVD Risk score (Arnett DK, et al., 2019) failed to calculate for the following reasons:   The 2019 ASCVD risk score is only valid for ages 56 to 46   Assessment & Plan:   1. GAD (generalized anxiety disorder) Restarting Zoloft 50mg  daily.   2. Exposure to sexually transmitted disease (STD) STI testing as below.  - WET PREP FOR TRICH, YEAST, CLUE - C. trachomatis/N. gonorrhoeae RNA - RPR - Hepatitis B surface antigen - Hepatitis C antibody - HIV Antibody (routine testing w rflx)  3. Onychomycosis Start Penlac topically. Reviewed instructions for application. Recommend trimming the raised part of the  toenail to prevent it from catching on clothing and getting further damage.  4. Eczema, unspecified type Reviewed options for treatment. She is already doing typical conservative measures for eczema management. Restart hydroxyzine 10mg  TID prn for itching. Discussed steroids. Feel that a longer taper would have a better chance of clearing it up without recurrence. Patient agreeable so 12 day taper of prednisone sent to the pharmacy. If interested in Dupixant, we will need to do a referral to dermatology. She will let me know if she wants to go that route.    Return in about 6 weeks (around 07/18/2022) for mood follow up.  ___________________________________________ , DNP, APRN, FNP-BC Primary Care and Sports Medicine [facility] [place]

## 2022-06-07 LAB — HEPATITIS C ANTIBODY: Hepatitis C Ab: NONREACTIVE

## 2022-06-07 LAB — HEPATITIS B SURFACE ANTIGEN: Hepatitis B Surface Ag: NONREACTIVE

## 2022-06-07 LAB — RPR: RPR Ser Ql: NONREACTIVE

## 2022-06-07 LAB — HIV ANTIBODY (ROUTINE TESTING W REFLEX): HIV 1&2 Ab, 4th Generation: NONREACTIVE

## 2022-06-07 LAB — C. TRACHOMATIS/N. GONORRHOEAE RNA
C. trachomatis RNA, TMA: NOT DETECTED
N. gonorrhoeae RNA, TMA: NOT DETECTED

## 2022-07-19 ENCOUNTER — Ambulatory Visit: Payer: Self-pay | Admitting: Medical-Surgical

## 2022-08-07 ENCOUNTER — Encounter: Payer: Self-pay | Admitting: Medical-Surgical

## 2022-08-07 ENCOUNTER — Ambulatory Visit (INDEPENDENT_AMBULATORY_CARE_PROVIDER_SITE_OTHER): Payer: Self-pay | Admitting: Medical-Surgical

## 2022-08-07 VITALS — BP 126/86 | HR 97 | Resp 20 | Ht 64.0 in | Wt 205.1 lb

## 2022-08-07 DIAGNOSIS — F411 Generalized anxiety disorder: Secondary | ICD-10-CM

## 2022-08-07 DIAGNOSIS — R7401 Elevation of levels of liver transaminase levels: Secondary | ICD-10-CM

## 2022-08-07 DIAGNOSIS — I10 Essential (primary) hypertension: Secondary | ICD-10-CM

## 2022-08-07 DIAGNOSIS — E119 Type 2 diabetes mellitus without complications: Secondary | ICD-10-CM

## 2022-08-07 LAB — POCT GLYCOSYLATED HEMOGLOBIN (HGB A1C): Hemoglobin A1C: 5.5 % (ref 4.0–5.6)

## 2022-08-07 MED ORDER — SERTRALINE HCL 50 MG PO TABS: 25.0000 mg | ORAL_TABLET | Freq: Every day | ORAL | 3 refills | Status: DC

## 2022-08-07 MED ORDER — METFORMIN HCL ER 500 MG PO TB24: ORAL_TABLET | ORAL | 5 refills | Status: DC

## 2022-08-07 NOTE — Progress Notes (Signed)
Established Patient Office Visit  Subjective   Patient ID: Sara Simpson, female   DOB: 06/01/1986 Age: 37 y.o. MRN: [id number]   Chief Complaint  Patient presents with   Follow-up   Diabetes   Hyperlipidemia   Hypertension   HPI Very pleasant 37 year old female presenting today for follow-up on:  Diabetes: Has been checking her sugar at home and notes that her fasting readings are usually between 109 and 140.  She has added some carbohydrates back to her diet over the holidays but is still working to be compliant with a diabetic regimen.  Taking metformin 1000 mg daily, tolerating well without side effects.  Her last hemoglobin A1c was 5.4% on 02/01/2022.  Mood: Taking sertraline 25 mg daily, tolerating well without side effects.  Notes that her previous skin issues fully resolved once she got back on the sertraline and her anxiety/depressive symptoms were better managed.  Today she, she reports that the medication is working very well for her and that she is happy at her current dose.  Denies SI/HI.   Objective:    Vitals:   08/07/22 1409 08/07/22 1410  BP: 126/86 126/86  Pulse: 97 97  Resp: 20   Height: 5' 4"$  (1.626 m)   Weight: 205 lb 1.6 oz (93 kg)   SpO2: 99% 99%  BMI (Calculated): 35.19     Physical Exam Vitals reviewed.  Constitutional:      General: She is not in acute distress.    Appearance: Normal appearance. She is obese. She is not ill-appearing.  HENT:     Head: Normocephalic and atraumatic.  Cardiovascular:     Rate and Rhythm: Normal rate and regular rhythm.     Pulses: Normal pulses.     Heart sounds: Normal heart sounds.  Pulmonary:     Effort: Pulmonary effort is normal. No respiratory distress.     Breath sounds: Normal breath sounds. No wheezing, rhonchi or rales.  Skin:    General: Skin is warm and dry.  Neurological:     Mental Status: She is alert and oriented to person, place, and time.  Psychiatric:        Mood and Affect: Mood  normal.        Behavior: Behavior normal.        Thought Content: Thought content normal.        Judgment: Judgment normal.    Results for orders placed or performed in visit on 08/07/22 (from the past 24 hour(s))  POCT HgB A1C     Status: None   Collection Time: 08/07/22  2:20 PM  Result Value Ref Range   Hemoglobin A1C 5.5 4.0 - 5.6 %   HbA1c POC (<> result, manual entry)     HbA1c, POC (prediabetic range)     HbA1c, POC (controlled diabetic range)         The ASCVD Risk score (Arnett DK, et al., 2019) failed to calculate for the following reasons:   The 2019 ASCVD risk score is only valid for ages 49 to 7   Assessment & Plan:   1. Controlled type 2 diabetes mellitus without complication, without long-term current use of insulin (HCC) POCT hemoglobin A1c 5.5% today indicating excellent control of diabetes.  Continue metformin 1 g daily.  Checking labs as below.  Continue monitoring sugars with fasting goal of 120 or less. - POCT HgB A1C - COMPLETE METABOLIC PANEL WITH GFR - Urine Microalbumin w/creat. ratio  2. Hypertension goal BP (blood  pressure) < 130/80 Blood pressure at goal today.  Continue sodium restriction and a heart healthy diet.  3. GAD (generalized anxiety disorder) Stable with symptoms well-controlled.  Continue sertraline 25 mg daily.  4. Transaminitis Checking CMP today.  Return in about 6 months (around 02/05/2023) for DM/HTN/HLD follow up.  ___________________________________________ Clearnce Sorrel, DNP, APRN, FNP-BC Primary Care and [place]

## 2022-08-08 ENCOUNTER — Encounter: Payer: Self-pay | Admitting: Medical-Surgical

## 2022-08-08 ENCOUNTER — Other Ambulatory Visit: Payer: Self-pay

## 2022-08-08 LAB — MICROALBUMIN / CREATININE URINE RATIO
Creatinine, Urine: 110 mg/dL (ref 20–275)
Microalb Creat Ratio: 8 mcg/mg creat (ref <30)
Microalb, Ur: 0.9 mg/dL

## 2022-08-08 LAB — COMPLETE METABOLIC PANEL WITH GFR
AG Ratio: 1.2 (calc) (ref 1.0–2.5)
ALT: 78 U/L — ABNORMAL HIGH (ref 6–29)
AST: 69 U/L — ABNORMAL HIGH (ref 10–30)
Albumin: 4.7 g/dL (ref 3.6–5.1)
Alkaline phosphatase (APISO): 73 U/L (ref 31–125)
BUN: 18 mg/dL (ref 7–25)
CO2: 25 mmol/L (ref 20–32)
Calcium: 10.7 mg/dL — ABNORMAL HIGH (ref 8.6–10.2)
Chloride: 100 mmol/L (ref 98–110)
Creat: 0.6 mg/dL (ref 0.50–0.97)
Globulin: 4 g/dL (calc) — ABNORMAL HIGH (ref 1.9–3.7)
Glucose, Bld: 115 mg/dL — ABNORMAL HIGH (ref 65–99)
Potassium: 4.5 mmol/L (ref 3.5–5.3)
Sodium: 137 mmol/L (ref 135–146)
Total Bilirubin: 0.7 mg/dL (ref 0.2–1.2)
Total Protein: 8.7 g/dL — ABNORMAL HIGH (ref 6.1–8.1)
eGFR: 118 mL/min/{1.73_m2} (ref >=60)

## 2022-10-31 ENCOUNTER — Other Ambulatory Visit: Payer: Self-pay | Admitting: Medical-Surgical

## 2022-11-01 ENCOUNTER — Encounter: Payer: Self-pay | Admitting: Medical-Surgical

## 2022-11-17 IMAGING — US US ABDOMEN LIMITED
1 series · 14 of 25 positions shown · non-contrast
Comparison: 01/29/2019

CLINICAL DATA: Abnormal liver enzymes

EXAM:
ULTRASOUND ABDOMEN LIMITED RIGHT UPPER QUADRANT

[Series 1: us abdomen limited ruq (liver/gb) · 14 of 37 slices shown]
[im 1/37]
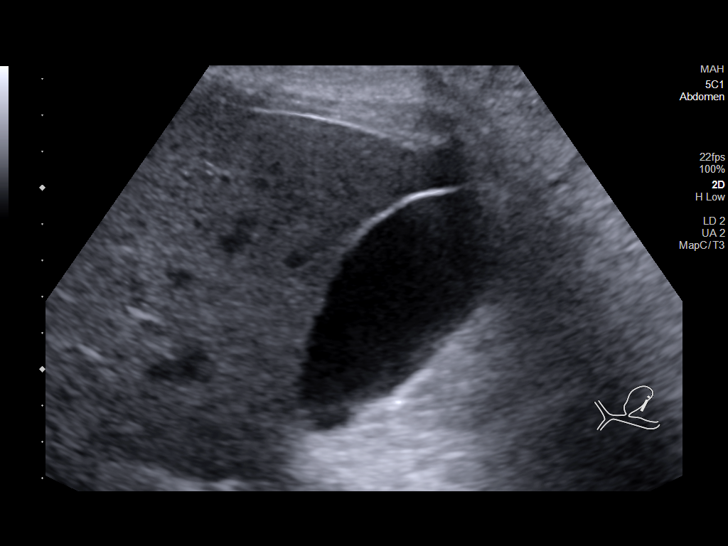
[im 4/37]
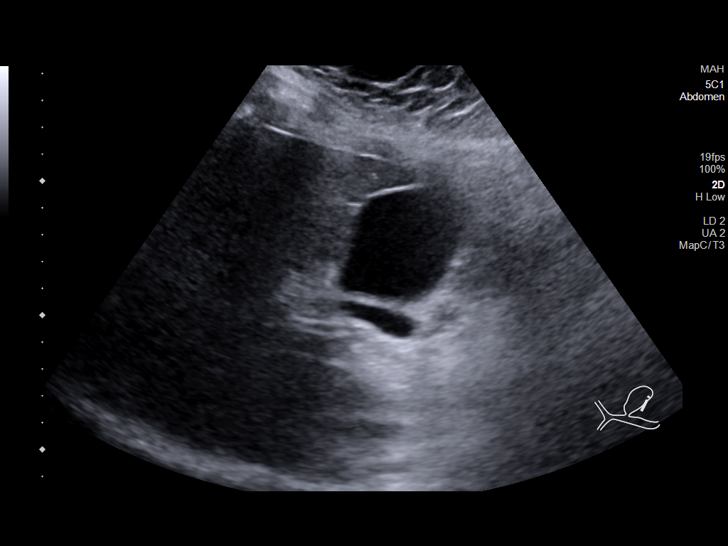
[im 7/37]
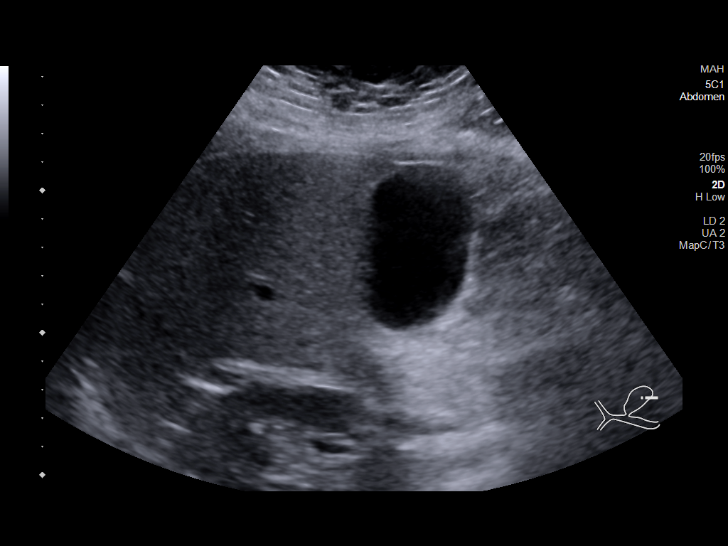
[im 10/37]
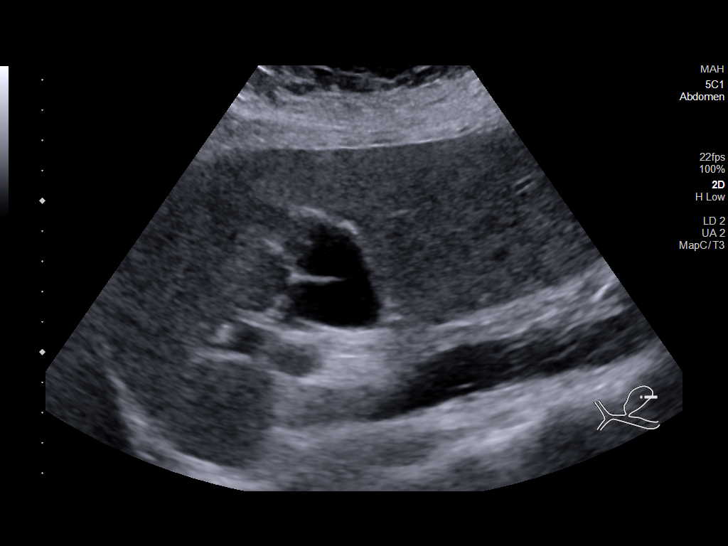
[im 13/37]
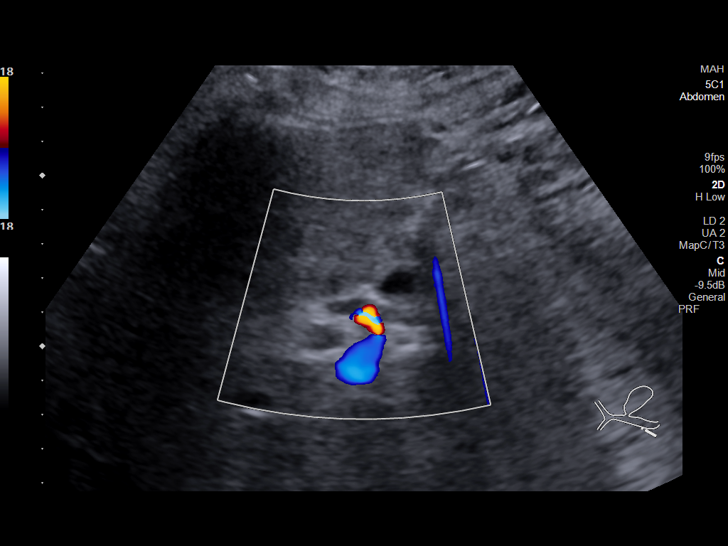
[im 14/37]
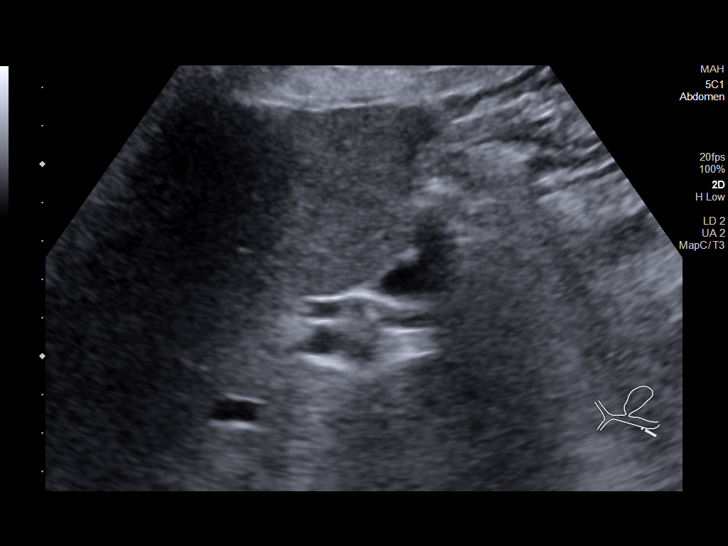
[im 17/37]
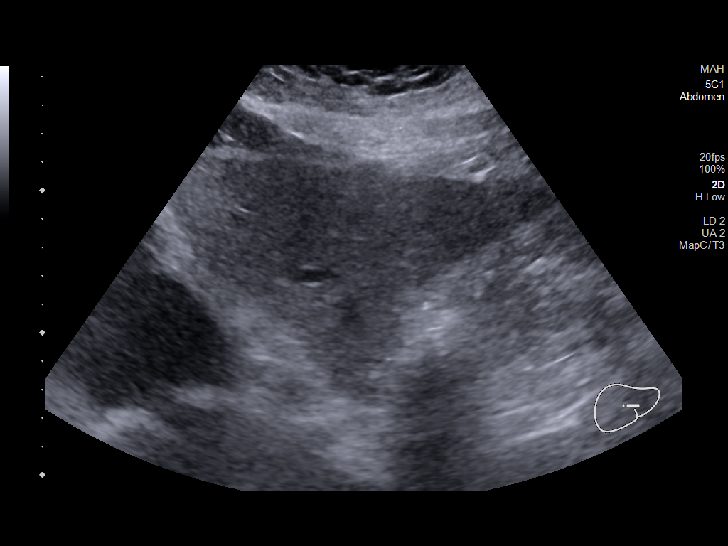
[im 20/37]
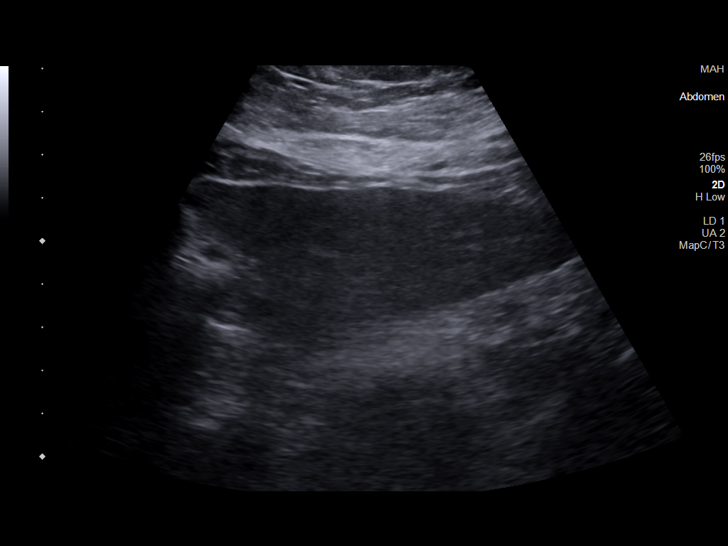
[im 23/37]
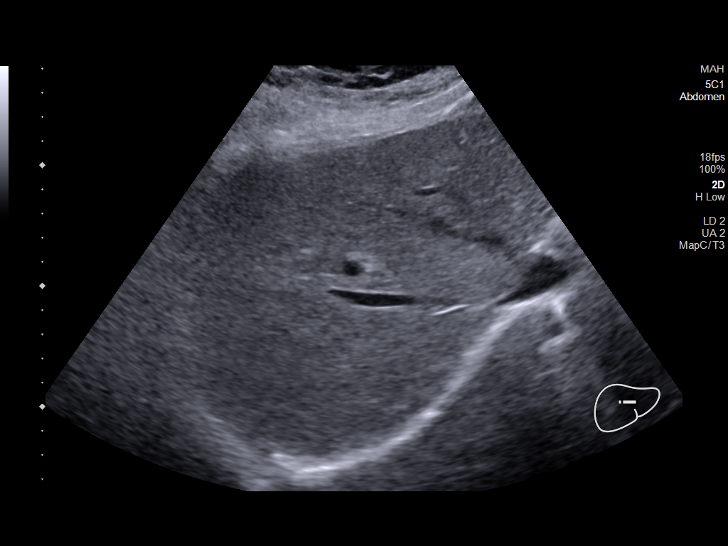
[im 25/37]
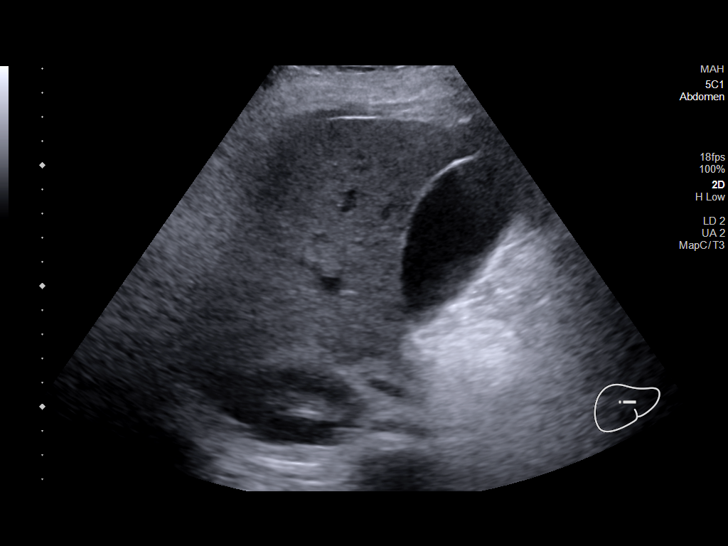
[im 28/37]
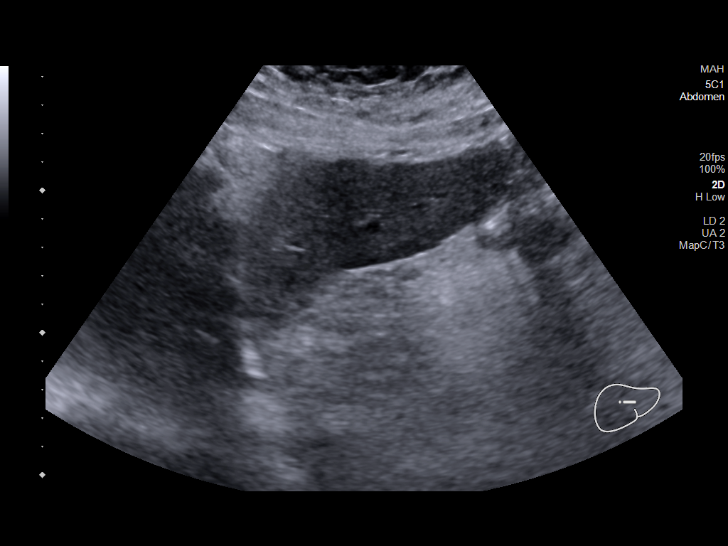
[im 31/37]
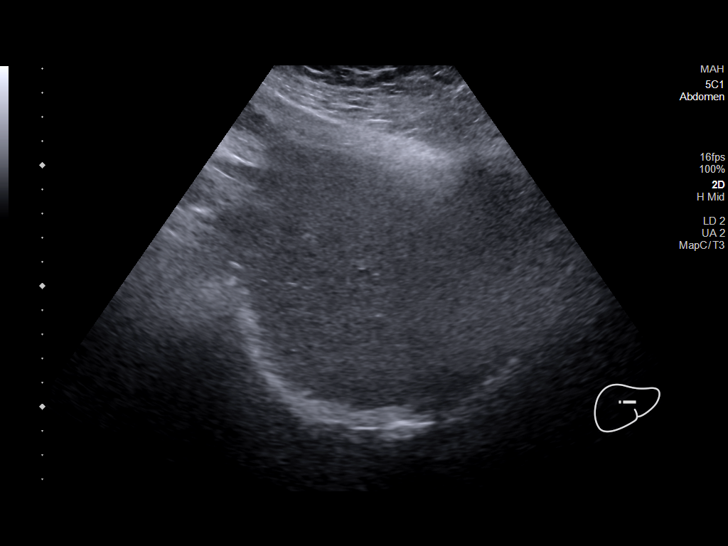
[im 34/37]
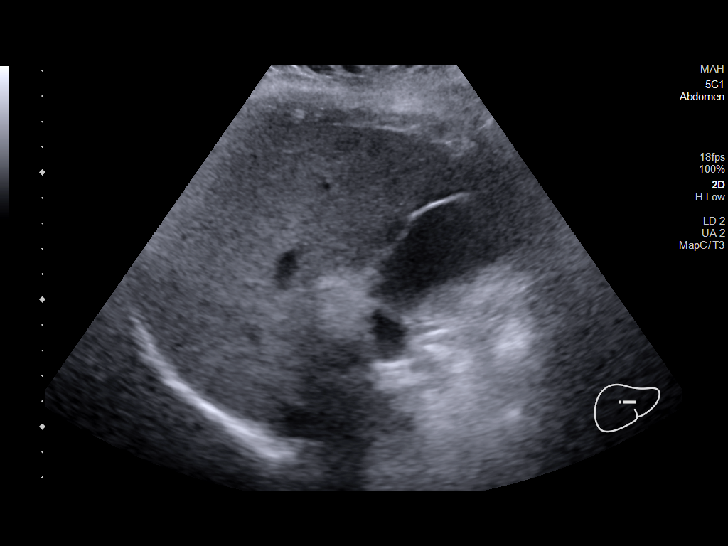
[im 37/37]
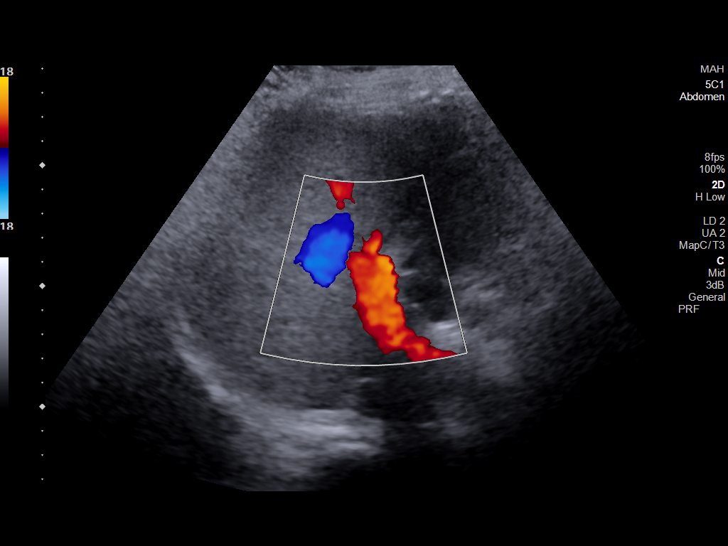

[14 of 25 positions shown; findings below may reference images not displayed]

FINDINGS: Gallbladder:

No gallstones or wall thickening visualized. No sonographic Murphy
sign noted by sonographer.

Common bile duct:

Diameter: 5 mm.  Where visualized, no filling defect.

Liver:

Echogenic liver with heterogeneity and a lobulated appearing
surface. The fissures also appear large. Portal vein is patent on
color Doppler imaging with normal direction of blood flow towards
the liver. No visible mass lesion.
IMPRESSION: Heterogeneous liver suggesting hepatocellular disease. The liver
surface also appears newly lobulated, please correlate for cirrhosis
risk factors.

## 2022-12-19 IMAGING — US US LIVER ELASTOGRAPHY
1 series · 12 of 25 positions shown · non-contrast
Comparison: 08/10/2021

CLINICAL DATA: Abnormal liver enzymes.

EXAM:
US LIVER ELASTOGRAPHY
TECHNIQUE: Sonography of the liver was performed. In addition, ultrasound
elastography evaluation of the liver was performed. A region of
interest was placed within the right lobe of the liver. Following
application of a compressive sonographic pulse, tissue
compressibility was assessed. Multiple assessments were performed at
the selected site. Median tissue compressibility was determined.
Previously, hepatic stiffness was assessed by shear wave velocity.
Based on recently published Society of Radiologists in Ultrasound
consensus article, reporting is now recommended to be performed in
the SI units of pressure (kiloPascals) representing hepatic
stiffness/elasticity. The obtained result is compared to the
published reference standards. (cACLD = compensated Advanced Chronic
Liver Disease)

[Series 1: us liver elastography · 12 of 59 slices shown]
[im 3/59]
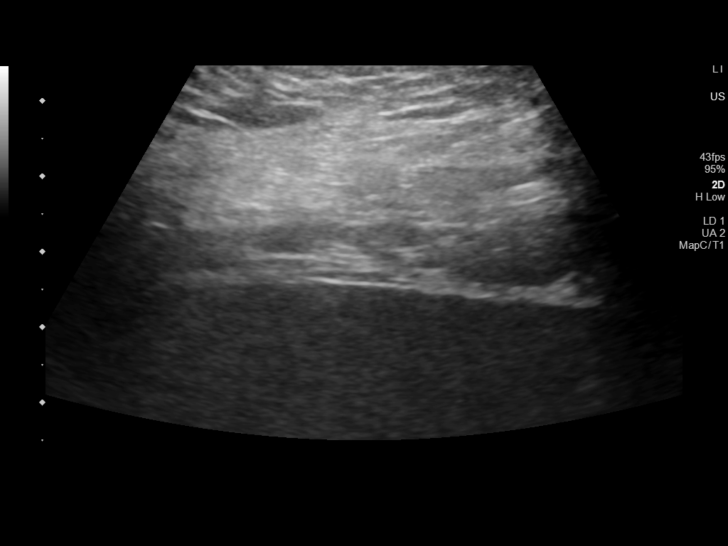
[im 8/59]
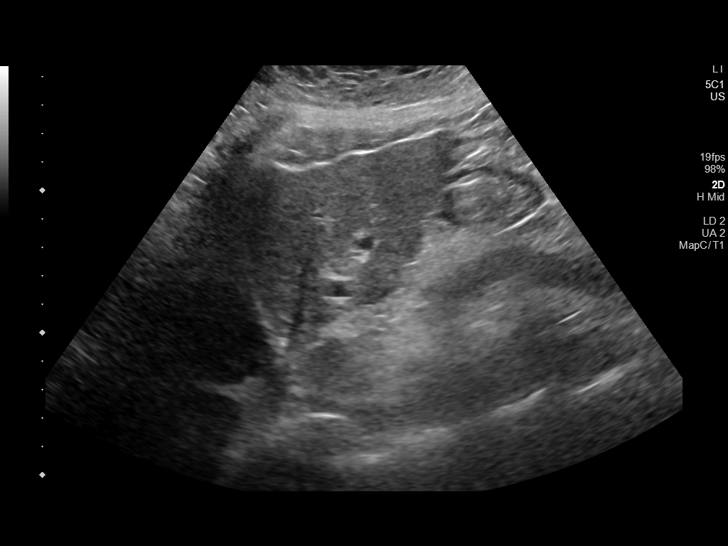
[im 13/59]
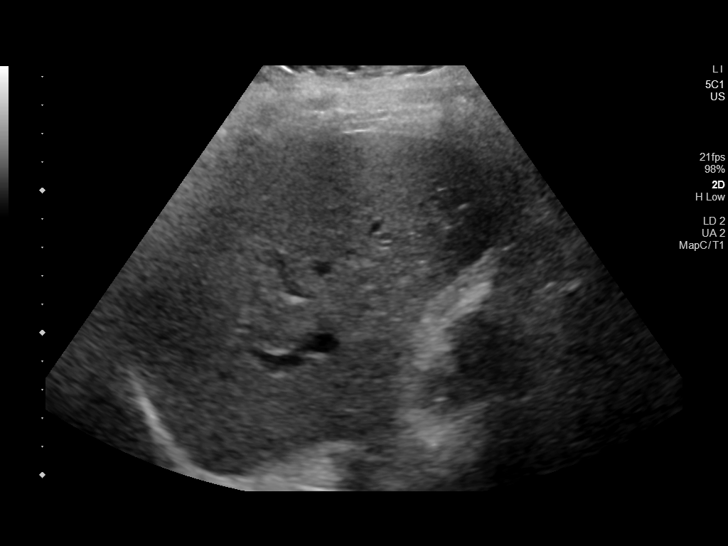
[im 17/59]
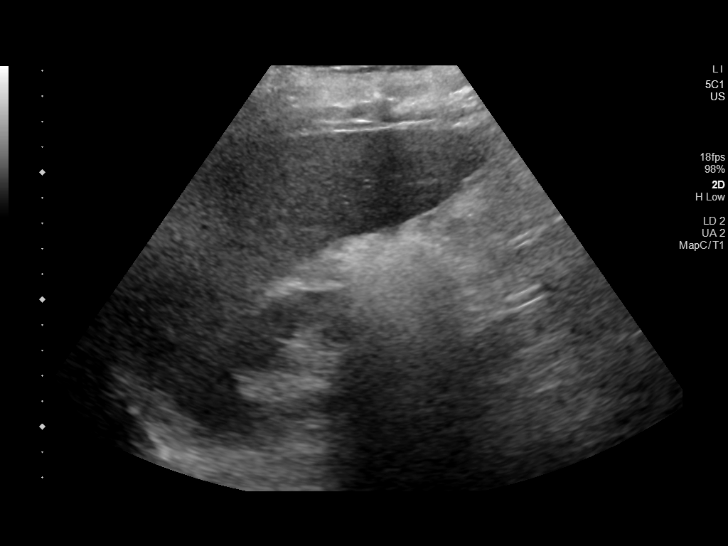
[im 22/59]
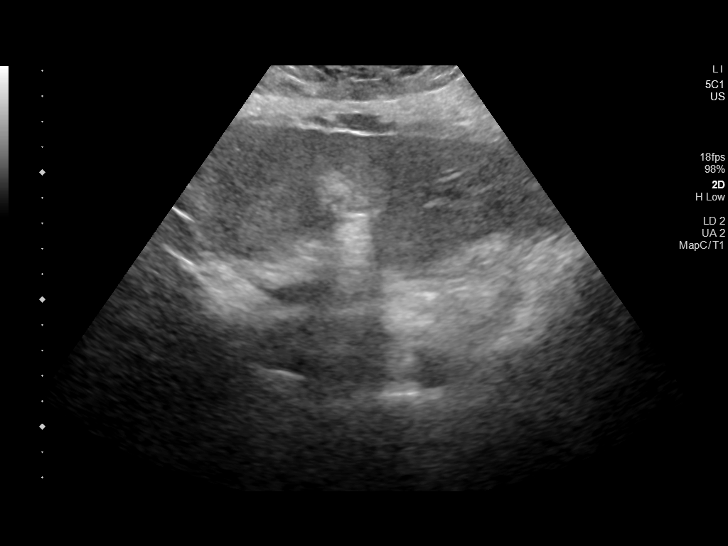
[im 27/59]
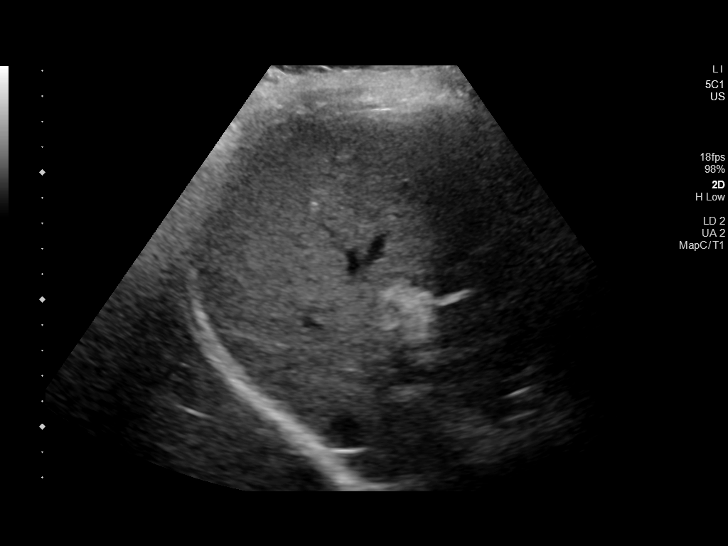
[im 32/59]
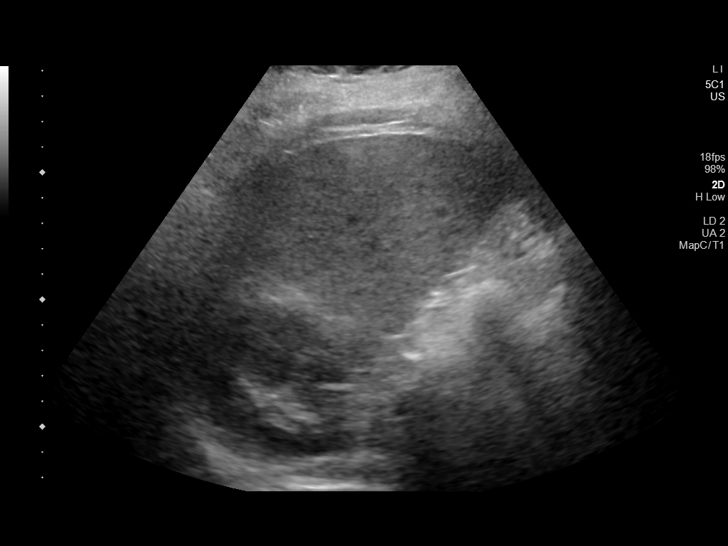
[im 37/59]
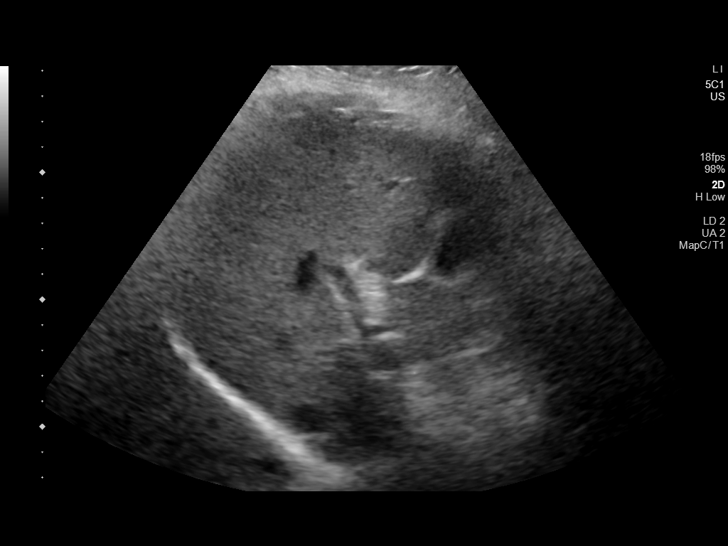
[im 42/59]
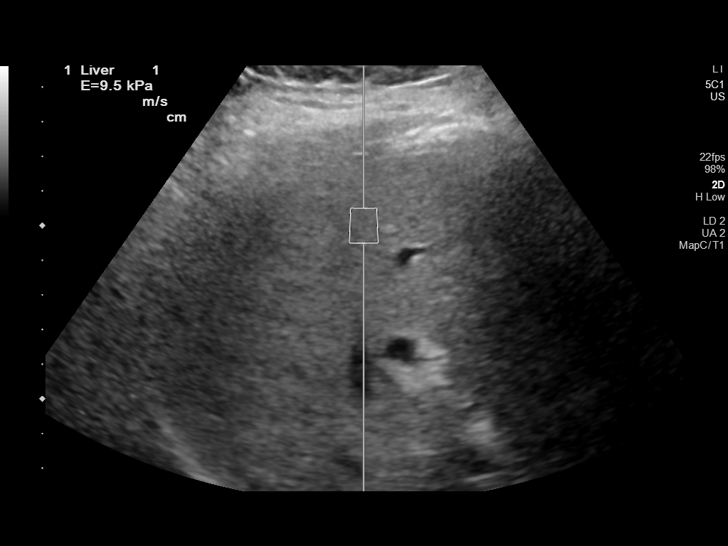
[im 46/59]
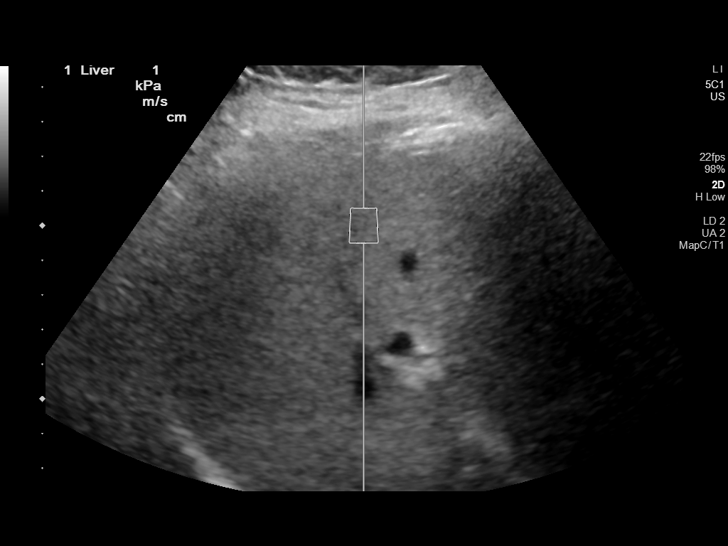
[im 51/59]
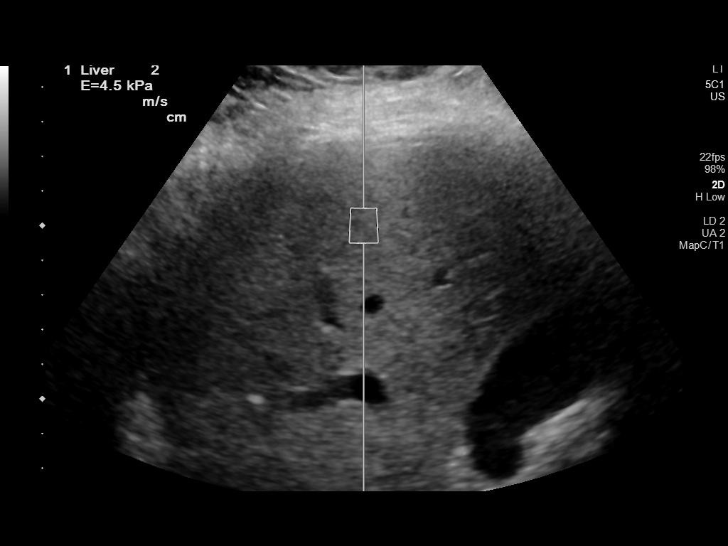
[im 56/59]
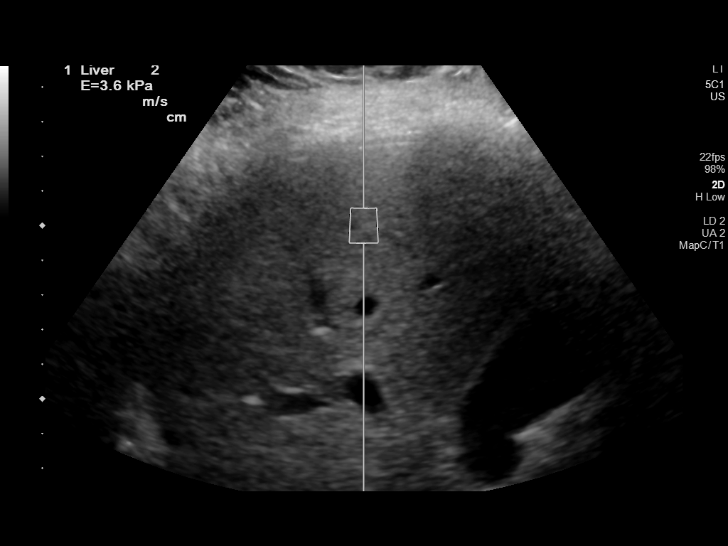

[12 of 25 positions shown; findings below may reference images not displayed]

FINDINGS: Liver: No focal lesion identified. Within normal limits in
parenchymal echogenicity. Portal vein is patent on color Doppler
imaging with normal direction of blood flow towards the liver.

ULTRASOUND HEPATIC ELASTOGRAPHY

Device: Siemens Helix VTQ

Patient position: Supine

Transducer: [id number]

Number of measurements: 10

Hepatic segment:  8

Median kPa:

IQR:

IQR/Median kPa ratio:

Data quality:  Satisfactory

Diagnostic category:  < or = 5 kPa: high probability of being normal

The use of hepatic elastography is applicable to patients with viral
hepatitis and non-alcoholic fatty liver disease. At this time, there
is insufficient data for the referenced cut-off values and use in
other causes of liver disease, including alcoholic liver disease.
Patients, however, may be assessed by elastography and serve as
their own reference standard/baseline.

In patients with non-alcoholic liver disease, the values suggesting
compensated advanced chronic liver disease (cACLD) may be lower, and
patients may need additional testing with elasticity results of [DATE]
kPa.

Please note that abnormal hepatic elasticity and shear wave
velocities may also be identified in clinical settings other than
with hepatic fibrosis, such as: acute hepatitis, elevated right
heart and central venous pressures including use of beta blockers,
Stayce disease (Bosca), infiltrative processes such as
mastocytosis/amyloidosis/infiltrative tumor/lymphoma, extrahepatic
cholestasis, with hyperemia in the post-prandial state, and with
liver transplantation. Correlation with patient history, laboratory
data, and clinical condition recommended.

Diagnostic Categories:

< or =5 kPa: high probability of being normal

< or =9 kPa: in the absence of other known clinical signs, rules [DATE] kPa and ?13 kPa: suggestive of cACLD, but needs further testing

>13 kPa: highly suggestive of cACLD

> or =17 kPa: highly suggestive of cACLD with an increased
probability of clinically significant portal hypertension
IMPRESSION: ULTRASOUND LIVER:

Unremarkable sonographic appearance of liver.

ULTRASOUND HEPATIC ELASTOGRAPHY:

Median kPa:

Diagnostic category:  < or = 5 kPa: high probability of being normal

## 2023-02-07 ENCOUNTER — Ambulatory Visit: Payer: Self-pay | Admitting: Medical-Surgical

## 2023-03-04 ENCOUNTER — Encounter: Payer: Self-pay | Admitting: Medical-Surgical

## 2023-03-04 ENCOUNTER — Telehealth (INDEPENDENT_AMBULATORY_CARE_PROVIDER_SITE_OTHER): Payer: Self-pay | Admitting: Medical-Surgical

## 2023-03-04 DIAGNOSIS — K611 Rectal abscess: Secondary | ICD-10-CM

## 2023-03-04 DIAGNOSIS — Z7984 Long term (current) use of oral hypoglycemic drugs: Secondary | ICD-10-CM

## 2023-03-04 DIAGNOSIS — F411 Generalized anxiety disorder: Secondary | ICD-10-CM

## 2023-03-04 DIAGNOSIS — E119 Type 2 diabetes mellitus without complications: Secondary | ICD-10-CM

## 2023-03-04 MED ORDER — HYDROXYZINE HCL 10 MG PO TABS: 10.0000 mg | ORAL_TABLET | Freq: Three times a day (TID) | ORAL | 5 refills | Status: DC | PRN

## 2023-03-04 MED ORDER — FLUCONAZOLE 150 MG PO TABS: 150.0000 mg | ORAL_TABLET | Freq: Once | ORAL | 0 refills | Status: AC

## 2023-03-04 MED ORDER — METFORMIN HCL ER 500 MG PO TB24: ORAL_TABLET | ORAL | 5 refills | Status: DC

## 2023-03-04 MED ORDER — DOXYCYCLINE HYCLATE 100 MG PO TABS: 100.0000 mg | ORAL_TABLET | Freq: Two times a day (BID) | ORAL | 0 refills | Status: DC
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
## 2023-03-20 ENCOUNTER — Telehealth: Payer: Self-pay | Admitting: Physician Assistant

## 2023-03-20 DIAGNOSIS — H8110 Benign paroxysmal vertigo, unspecified ear: Secondary | ICD-10-CM

## 2023-03-20 MED ORDER — PROMETHAZINE HCL 25 MG PO TABS: 25.0000 mg | ORAL_TABLET | Freq: Three times a day (TID) | ORAL | 0 refills | Status: DC | PRN

## 2023-03-20 MED ORDER — PREDNISONE 20 MG PO TABS: 40.0000 mg | ORAL_TABLET | Freq: Every day | ORAL | 0 refills | Status: DC

## 2023-03-20 NOTE — Progress Notes (Addendum)
Virtual Visit Consent   Sara Simpson, you are scheduled for a virtual visit with a [facility] provider today. Just as with appointments in the office, your consent must be obtained to participate. Your consent will be active for this visit and any virtual visit you may have with one of our providers in the next 365 days. If you have a MyChart account, a copy of this consent can be sent to you electronically.  As this is a virtual visit, video technology does not allow for your provider to perform a traditional examination. This may limit your provider's ability to fully assess your condition. If your provider identifies any concerns that need to be evaluated in person or the need to arrange testing (such as labs, EKG, etc.), we will make arrangements to do so. Although advances in technology are sophisticated, we cannot ensure that it will always work on either your end or our end. If the connection with a video visit is poor, the visit may have to be switched to a telephone visit. With either a video or telephone visit, we are not always able to ensure that we have a secure connection.  By engaging in this virtual visit, you consent to the provision of healthcare and authorize for your insurance to be billed (if applicable) for the services provided during this visit. Depending on your insurance coverage, you may receive a charge related to this service.  I need to obtain your verbal consent now. Are you willing to proceed with your visit today? Sara Simpson has provided verbal consent on 03/20/2023 for a virtual visit (video or telephone). Piedad Climes, [place]  Date: 03/20/2023 12:39 PM  Virtual Visit via Video Note   I, Piedad Climes, connected with  Sara Simpson  ([id number], [birth year]) on 03/20/23 at 12:15 PM EDT by a video-enabled telemedicine application and verified that I am speaking with the correct person using two identifiers.  Location: Patient:  Virtual Visit Location Patient: Home Provider: Virtual Visit Location Provider: Home Office   I discussed the limitations of evaluation and management by telemedicine and the availability of in person appointments. The patient expressed understanding and agreed to proceed.    History of Present Illness: Sara Simpson is a 37 y.o. who identifies as a female who was assigned female at birth, and is being seen today for periodic episodes of vertigo. Notes this episode started yesterday with full vertigo with positional exacerbation of symptoms, especially turning head. Denies palpitations, lightheadedness or SOB.  Denies fevers, chills, cough, congestion or sinus pressure. Notes some blurring of vision. Notes the last episode was back in July. Noted eventually resolves itself. Bonine OTC helps. Has been taking for this episode but is not helping with her symptoms.   HPI: HPI  Problems:  Patient Active Problem List   Diagnosis Date Noted   Controlled type 2 diabetes mellitus without complication, without long-term current use of insulin (HCC) 01/31/2022   GAD (generalized anxiety disorder) 09/28/2019   Seasonal allergies 09/28/2019   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    Allergies:  Allergies  Allergen Reactions   Molds & Smuts Swelling   Penicillins Itching and Other (See Comments)    Unknown, childhood reaction   Fluoxetine Other (See Comments) and Itching   Medications:  Current  Outpatient Medications:    predniSONE (DELTASONE) 20 MG tablet, Take 2 tablets (40 mg total) by mouth daily with breakfast., Disp: 10 tablet, Rfl: 0   promethazine (PHENERGAN) 25 MG tablet, Take 1 tablet (25 mg total) by mouth every 8 (eight) hours as needed for nausea or vomiting., Disp: 20 tablet, Rfl: 0   blood  glucose meter kit and supplies KIT, Dispense based on patient and insurance preference. Use up to four times daily as directed. Please include lancets, test strips, control solution., Disp: 1 each, Rfl: 0   Continuous Blood Gluc Sensor (FREESTYLE LIBRE 3 SENSOR) MISC, 1 each by Does not apply route every 14 (fourteen) days., Disp: 6 each, Rfl: 1   Continuous Glucose Monitor Sup KIT, 1 each by Does not apply route continuous., Disp: 1 kit, Rfl: 0   doxycycline (VIBRA-TABS) 100 MG tablet, Take 1 tablet (100 mg total) by mouth 2 (two) times daily., Disp: 14 tablet, Rfl: 0   hydrOXYzine (ATARAX) 10 MG tablet, Take 1 tablet (10 mg total) by mouth 3 (three) times daily as needed., Disp: 90 tablet, Rfl: 5   metFORMIN (GLUCOPHAGE-XR) 500 MG 24 hr tablet, TAKE ONE TABLET BY MOUTH ONE TIME DAILY WITH BREAKFAST, Disp: 30 tablet, Rfl: 5   Probiotic Product (PROBIOTIC DAILY PO), Take by mouth., Disp: , Rfl:   Observations/Objective: Patient is well-developed, well-nourished in no acute distress.  Resting comfortably at home.  Head is normocephalic, atraumatic.  No labored breathing. Speech is clear and coherent with logical content.  Patient is alert and oriented at baseline.   Assessment and Plan: 1. Benign paroxysmal positional vertigo, unspecified laterality - promethazine (PHENERGAN) 25 MG tablet; Take 1 tablet (25 mg total) by mouth every 8 (eight) hours as needed for nausea or vomiting.  Dispense: 20 tablet; Refill: 0 - predniSONE (DELTASONE) 20 MG tablet; Take 2 tablets (40 mg total) by mouth daily with breakfast.  Dispense: 10 tablet; Refill: 0  Prior episodes. Current one < 2 days. Neuro exam grossly intact. Supportive measures and OTC medications reviewed. Start Epley maneuvers. Prednisone and phenergan per orders. Needs close PCP follow-up for further assessment giving recurring episodes. ER precautions reviewed.   Follow Up Instructions: I discussed the assessment and treatment plan with the  patient. The patient was provided an opportunity to ask questions and all were answered. The patient agreed with the plan and demonstrated an understanding of the instructions.  A copy of instructions were sent to the patient via MyChart unless otherwise noted below.   The patient was advised to call back or seek an in-person evaluation if the symptoms worsen or if the condition fails to improve as anticipated.  Time:  I spent 15 minutes with the patient via telehealth technology discussing the above problems/concerns.    Piedad Climes, PA-C

## 2023-03-20 NOTE — Patient Instructions (Signed)
Sara Simpson, thank you for joining Piedad Climes, PA-C for today's virtual visit.  While this provider is not your primary care provider (PCP), if your PCP is located in our provider database this encounter information will be shared with them immediately following your visit.   A [facility] MyChart account gives you access to today's visit and all your visits, tests, and labs performed at [facility] " click here if you don't have a [facility] MyChart account or go to [email or web address]  Consent: (Patient) Sara Simpson provided verbal consent for this virtual visit at the beginning of the encounter.  Current Medications:  Current Outpatient Medications:    predniSONE (DELTASONE) 20 MG tablet, Take 2 tablets (40 mg total) by mouth daily with breakfast., Disp: 10 tablet, Rfl: 0   promethazine (PHENERGAN) 25 MG tablet, Take 1 tablet (25 mg total) by mouth every 8 (eight) hours as needed for nausea or vomiting., Disp: 20 tablet, Rfl: 0   blood glucose meter kit and supplies KIT, Dispense based on patient and insurance preference. Use up to four times daily as directed. Please include lancets, test strips, control solution., Disp: 1 each, Rfl: 0   Continuous Blood Gluc Sensor (FREESTYLE LIBRE 3 SENSOR) MISC, 1 each by Does not apply route every 14 (fourteen) days., Disp: 6 each, Rfl: 1   Continuous Glucose Monitor Sup KIT, 1 each by Does not apply route continuous., Disp: 1 kit, Rfl: 0   doxycycline (VIBRA-TABS) 100 MG tablet, Take 1 tablet (100 mg total) by mouth 2 (two) times daily., Disp: 14 tablet, Rfl: 0   hydrOXYzine (ATARAX) 10 MG tablet, Take 1 tablet (10 mg total) by mouth 3 (three) times daily as needed., Disp: 90 tablet, Rfl: 5   metFORMIN (GLUCOPHAGE-XR) 500 MG 24 hr tablet, TAKE ONE TABLET BY MOUTH ONE TIME DAILY WITH BREAKFAST, Disp: 30 tablet, Rfl: 5   Probiotic Product (PROBIOTIC DAILY PO), Take by mouth., Disp: , Rfl:    Medications  ordered in this encounter:  Meds ordered this encounter  Medications   promethazine (PHENERGAN) 25 MG tablet    Sig: Take 1 tablet (25 mg total) by mouth every 8 (eight) hours as needed for nausea or vomiting.    Dispense:  20 tablet    Refill:  0    Order Specific Question:   Supervising Provider    Answer:   Merrilee Jansky [[id number]]   predniSONE (DELTASONE) 20 MG tablet    Sig: Take 2 tablets (40 mg total) by mouth daily with breakfast.    Dispense:  10 tablet    Refill:  0    Order Specific Question:   Supervising Provider    Answer:   Merrilee Jansky [[id number]]     *If you need refills on other medications prior to your next appointment, please contact your pharmacy*  Follow-Up: Call back or seek an in-person evaluation if the symptoms worsen or if the condition fails to improve as anticipated.  [facility] [phone]  Other Instructions Stay hydrated and rest. Use the prescribed medications as directed. Start exercises below. If no substantial improvement within 24-48 hours, or any new or worsening symptoms, you need an in-person evaluation ASAP. Make sure to schedule follow-up with your PCP giving history of similar episodes.   How to Perform the Epley Maneuver The Epley maneuver is an exercise that relieves symptoms of vertigo. Vertigo is the feeling that you or your surroundings are moving when  they are not. When you feel vertigo, you may feel like the room is spinning and may have trouble walking. The Epley maneuver is used for a type of vertigo caused by a calcium deposit in a part of the inner ear. The maneuver involves changing head positions to help the deposit move out of the area. You can do this maneuver at home whenever you have symptoms of vertigo. You can repeat it in 24 hours if your vertigo has not gone away. Even though the Epley maneuver may relieve your vertigo for a few weeks, it is possible that your symptoms will return. This maneuver  relieves vertigo, but it does not relieve dizziness. What are the risks? If it is done correctly, the Epley maneuver is considered safe. Sometimes it can lead to dizziness or nausea that goes away after a short time. If you develop other symptoms--such as changes in vision, weakness, or numbness--stop doing the maneuver and call your health care provider. Supplies needed: A bed or table. A pillow. How to do the Epley maneuver     Sit on the edge of a bed or table with your back straight and your legs extended or hanging over the edge of the bed or table. Turn your head halfway toward the affected ear or side as told by your health care provider. Lie backward quickly with your head turned until you are lying flat on your back. Your head should dangle (head-hanging position). You may want to position a pillow under your shoulders. Hold this position for at least 30 seconds. If you feel dizzy or have symptoms of vertigo, continue to hold the position until the symptoms stop. Turn your head to the opposite direction until your unaffected ear is facing down. Your head should continue to dangle. Hold this position for at least 30 seconds. If you feel dizzy or have symptoms of vertigo, continue to hold the position until the symptoms stop. Turn your whole body to the same side as your head so that you are positioned on your side. Your head will now be nearly facedown and no longer needs to dangle. Hold for at least 30 seconds. If you feel dizzy or have symptoms of vertigo, continue to hold the position until the symptoms stop. Sit back up. You can repeat the maneuver in 24 hours if your vertigo does not go away. Follow these instructions at home: For 24 hours after doing the Epley maneuver: Keep your head in an upright position. When lying down to sleep or rest, keep your head raised (elevated) with two or more pillows. Avoid excessive neck movements. Activity Do not drive or use machinery if you  feel dizzy. After doing the Epley maneuver, return to your normal activities as told by your health care provider. Ask your health care provider what activities are safe for you. General instructions Drink enough fluid to keep your urine pale yellow. Do not drink alcohol. Take over-the-counter and prescription medicines only as told by your health care provider. Keep all follow-up visits. This is important. Preventing vertigo symptoms Ask your health care provider if there is anything you should do at home to prevent vertigo. He or she may recommend that you: Keep your head elevated with two or more pillows while you sleep. Do not sleep on the side of your affected ear. Get up slowly from bed. Avoid sudden movements during the day. Avoid extreme head positions or movement, such as looking up or bending over. Contact a health care provider  if: Your vertigo gets worse. You have other symptoms, including: Nausea. Vomiting. Headache. Get help right away if you: Have vision changes. Have a headache or neck pain that is severe or getting worse. Cannot stop vomiting. Have new numbness or weakness in any part of your body. These symptoms may represent a serious problem that is an emergency. Do not wait to see if the symptoms will go away. Get medical help right away. Call your local emergency services (911 in the [place] Do not drive yourself to the hospital. Summary Vertigo is the feeling that you or your surroundings are moving when they are not. The Epley maneuver is an exercise that relieves symptoms of vertigo. If the Epley maneuver is done correctly, it is considered safe. This information is not intended to replace advice given to you by your health care provider. Make sure you discuss any questions you have with your health care provider. Document Revised: 05/11/2020 Document Reviewed: 05/11/2020 Elsevier Patient Education  2024 Elsevier Inc.    If you have been instructed to have  an in-person evaluation today at a local Urgent Care facility, please use the link below. It will take you to a list of all of our available [facility], including address, phone number and hours of operation. Please do not delay care.  [facility]  If you or a family member do not have a primary care provider, use the link below to schedule a visit and establish care. When you choose a [facility] primary care physician or advanced practice provider, you gain a long-term partner in health. Find a Primary Care Provider  Learn more about [facility]'s in-office and virtual care options: [facility] - Get Care Now

## 2023-03-27 ENCOUNTER — Encounter: Payer: Self-pay | Admitting: Family Medicine

## 2023-03-27 ENCOUNTER — Ambulatory Visit (INDEPENDENT_AMBULATORY_CARE_PROVIDER_SITE_OTHER): Payer: Self-pay | Admitting: Family Medicine

## 2023-03-27 VITALS — BP 119/81 | HR 89 | Ht 64.0 in | Wt 217.8 lb

## 2023-03-27 DIAGNOSIS — Z113 Encounter for screening for infections with a predominantly sexual mode of transmission: Secondary | ICD-10-CM | POA: Insufficient documentation

## 2023-03-27 DIAGNOSIS — E119 Type 2 diabetes mellitus without complications: Secondary | ICD-10-CM

## 2023-03-27 DIAGNOSIS — R7401 Elevation of levels of liver transaminase levels: Secondary | ICD-10-CM

## 2023-03-27 DIAGNOSIS — H8110 Benign paroxysmal vertigo, unspecified ear: Secondary | ICD-10-CM | POA: Insufficient documentation

## 2023-03-27 DIAGNOSIS — Z7984 Long term (current) use of oral hypoglycemic drugs: Secondary | ICD-10-CM

## 2023-03-27 LAB — POCT GLYCOSYLATED HEMOGLOBIN (HGB A1C): Hemoglobin A1C: 5.6 % (ref 4.0–5.6)

## 2023-03-27 LAB — POCT URINALYSIS DIP (CLINITEK)
Bilirubin, UA: NEGATIVE
Glucose, UA: NEGATIVE mg/dL
Ketones, POC UA: NEGATIVE mg/dL
Leukocytes, UA: NEGATIVE
Nitrite, UA: NEGATIVE
POC PROTEIN,UA: NEGATIVE
Spec Grav, UA: >=1.03 — AB (ref 1.010–1.025)
Urobilinogen, UA: 1 U/dL
pH, UA: 6 (ref 5.0–8.0)

## 2023-03-27 NOTE — Assessment & Plan Note (Signed)
-   will go ahead and repeat blood work today to evaluate liver function

## 2023-03-27 NOTE — Assessment & Plan Note (Signed)
-   have sent referral to ENT and PT for vestibular therapy

## 2023-03-27 NOTE — Assessment & Plan Note (Signed)
Blood work, wet prep, and urine ordered

## 2023-03-27 NOTE — Progress Notes (Signed)
Established patient visit   Patient: Sara Simpson   DOB: [birth year]   37 y.o. Female  MRN: [id number] Visit Date: 03/27/2023  Today's healthcare provider: Charlton Amor, DO   Chief Complaint  Patient presents with   Follow-up    SUBJECTIVE    Chief Complaint  Patient presents with   Follow-up   HPI   T2DM - pt presents for diabetes follow up. Most recent A1c was 5.5 (down from 10.6) in 2022. Currently on metformin and diet and exercise modifications.  - not currently on statin therapy  Vertigo - pt has been having vertigo issues and was given prednisone and meclizine during a virtual visit.    Wants std screening  Review of Systems  Constitutional:  Negative for activity change, fatigue and fever.  Respiratory:  Negative for cough and shortness of breath.   Cardiovascular:  Negative for chest pain.  Gastrointestinal:  Negative for abdominal pain.  Genitourinary:  Negative for difficulty urinating.       Current Meds  Medication Sig   blood glucose meter kit and supplies KIT Dispense based on patient and insurance preference. Use up to four times daily as directed. Please include lancets, test strips, control solution.   Continuous Blood Gluc Sensor (FREESTYLE LIBRE 3 SENSOR) MISC 1 each by Does not apply route every 14 (fourteen) days.   Continuous Glucose Monitor Sup KIT 1 each by Does not apply route continuous.   doxycycline (VIBRA-TABS) 100 MG tablet Take 1 tablet (100 mg total) by mouth 2 (two) times daily.   hydrOXYzine (ATARAX) 10 MG tablet Take 1 tablet (10 mg total) by mouth 3 (three) times daily as needed.   metFORMIN (GLUCOPHAGE-XR) 500 MG 24 hr tablet TAKE ONE TABLET BY MOUTH ONE TIME DAILY WITH BREAKFAST   predniSONE (DELTASONE) 20 MG tablet Take 2 tablets (40 mg total) by mouth daily with breakfast.   Probiotic Product (PROBIOTIC DAILY PO) Take by mouth.   promethazine (PHENERGAN) 25 MG tablet Take 1 tablet (25 mg total) by mouth every 8  (eight) hours as needed for nausea or vomiting.    OBJECTIVE    BP 119/81 (BP Location: Left Arm, Patient Position: Sitting, Cuff Size: Large)   Pulse 89   Ht 5\' 4"  (1.626 m)   Wt 217 lb 12 oz (98.8 kg)   SpO2 96%   BMI 37.38 kg/m   Physical Exam Vitals and nursing note reviewed.  Constitutional:      General: She is not in acute distress.    Appearance: Normal appearance.  HENT:     Head: Normocephalic and atraumatic.     Right Ear: External ear normal.     Left Ear: External ear normal.     Nose: Nose normal.  Eyes:     Conjunctiva/sclera: Conjunctivae normal.  Cardiovascular:     Rate and Rhythm: Normal rate and regular rhythm.  Pulmonary:     Effort: Pulmonary effort is normal.     Breath sounds: Normal breath sounds.  Neurological:     General: No focal deficit present.     Mental Status: She is alert and oriented to person, place, and time.  Psychiatric:        Mood and Affect: Mood normal.        Behavior: Behavior normal.        Thought Content: Thought content normal.        Judgment: Judgment normal.        ASSESSMENT &  PLAN    Problem List Items Addressed This Visit       Endocrine   Controlled type 2 diabetes mellitus without complication, without long-term current use of insulin (HCC)    POC A1c done today and is 5.6% - continue metformin  - doing well with diet and exercise control      Relevant Orders   POCT HgB A1C (Completed)   Lipid panel   CBC     Nervous and Auditory   Benign paroxysmal positional vertigo    - have sent referral to ENT and PT for vestibular therapy        Relevant Orders   Ambulatory referral to ENT   Ambulatory referral to Physical Therapy   CBC     Other   Transaminitis - Primary    - will go ahead and repeat blood work today to evaluate liver function      Relevant Orders   CMP14+EGFR   Screening examination for STD (sexually transmitted disease)    Blood work, wet prep, and urine ordered       Relevant Orders   HIV antibody (with reflex)   RPR   Chlamydia/Gonococcus/Trichomonas, NAA   WET PREP FOR TRICH, YEAST, CLUE   POCT URINALYSIS DIP (CLINITEK) (Completed)    Return in about 4 weeks (around 04/24/2023) for with JOY for vertigo follow up.      No orders of the defined types were placed in this encounter.   Orders Placed This Encounter  Procedures   Chlamydia/Gonococcus/Trichomonas, NAA   WET PREP FOR TRICH, YEAST, CLUE   HIV antibody (with reflex)   RPR   CMP14+EGFR    Order Specific Question:   Has the patient fasted?    Answer:   No   Lipid panel    Order Specific Question:   Has the patient fasted?    Answer:   No    Order Specific Question:   Release to patient    Answer:   Immediate   CBC   Ambulatory referral to ENT    Referral Priority:   Routine    Referral Type:   Consultation    Referral Reason:   Specialty Services Required    Requested Specialty:   Otolaryngology    Number of Visits Requested:   1   Ambulatory referral to Physical Therapy    Referral Priority:   Routine    Referral Type:   Physical Medicine    Referral Reason:   Specialty Services Required    Requested Specialty:   Physical Therapy    Number of Visits Requested:   1   POCT HgB A1C   POCT URINALYSIS DIP (CLINITEK)     Charlton Amor, DO  [facility] at [facility] [phone] (phone) [phone] (fax)  [facility]

## 2023-03-27 NOTE — Assessment & Plan Note (Addendum)
POC A1c done today and is 5.6% - continue metformin  - doing well with diet and exercise control

## 2023-03-28 ENCOUNTER — Other Ambulatory Visit: Payer: Self-pay | Admitting: Family Medicine

## 2023-03-28 DIAGNOSIS — A599 Trichomoniasis, unspecified: Secondary | ICD-10-CM

## 2023-03-28 DIAGNOSIS — B9689 Other specified bacterial agents as the cause of diseases classified elsewhere: Secondary | ICD-10-CM

## 2023-03-28 LAB — CMP14+EGFR
ALT: 82 [IU]/L — ABNORMAL HIGH (ref 0–32)
AST: 85 [IU]/L — ABNORMAL HIGH (ref 0–40)
Albumin: 4.1 g/dL (ref 3.9–4.9)
Alkaline Phosphatase: 76 [IU]/L (ref 44–121)
BUN/Creatinine Ratio: 15 (ref 9–23)
BUN: 10 mg/dL (ref 6–20)
Bilirubin Total: 0.3 mg/dL (ref 0.0–1.2)
CO2: 20 mmol/L (ref 20–29)
Calcium: 9.4 mg/dL (ref 8.7–10.2)
Chloride: 103 mmol/L (ref 96–106)
Creatinine, Ser: 0.66 mg/dL (ref 0.57–1.00)
Globulin, Total: 3.7 g/dL (ref 1.5–4.5)
Glucose: 126 mg/dL — ABNORMAL HIGH (ref 70–99)
Potassium: 3.9 mmol/L (ref 3.5–5.2)
Sodium: 141 mmol/L (ref 134–144)
Total Protein: 7.8 g/dL (ref 6.0–8.5)
eGFR: 115 mL/min/{1.73_m2} (ref >59)

## 2023-03-28 LAB — CBC
Hematocrit: 39.4 % (ref 34.0–46.6)
Hemoglobin: 13.5 g/dL (ref 11.1–15.9)
MCH: 37.4 pg — ABNORMAL HIGH (ref 26.6–33.0)
MCHC: 34.3 g/dL (ref 31.5–35.7)
MCV: 109 fL — ABNORMAL HIGH (ref 79–97)
Platelets: 260 10*3/uL (ref 150–450)
RBC: 3.61 x10E6/uL — ABNORMAL LOW (ref 3.77–5.28)
RDW: 12.7 % (ref 11.7–15.4)
WBC: 6.2 10*3/uL (ref 3.4–10.8)

## 2023-03-28 LAB — LIPID PANEL
Chol/HDL Ratio: 2.5 {ratio} (ref 0.0–4.4)
Cholesterol, Total: 214 mg/dL — ABNORMAL HIGH (ref 100–199)
HDL: 85 mg/dL (ref >39)
LDL Chol Calc (NIH): 104 mg/dL — ABNORMAL HIGH (ref 0–99)
Triglycerides: 150 mg/dL — ABNORMAL HIGH (ref 0–149)
VLDL Cholesterol Cal: 25 mg/dL (ref 5–40)

## 2023-03-28 LAB — WET PREP FOR TRICH, YEAST, CLUE
Clue Cell Exam: POSITIVE — AB
Trichomonas Exam: POSITIVE — AB
Yeast Exam: NEGATIVE

## 2023-03-28 LAB — RPR: RPR Ser Ql: NONREACTIVE

## 2023-03-28 LAB — HIV ANTIBODY (ROUTINE TESTING W REFLEX): HIV Screen 4th Generation wRfx: NONREACTIVE

## 2023-03-28 MED ORDER — METRONIDAZOLE 500 MG PO TABS: 500.0000 mg | ORAL_TABLET | Freq: Two times a day (BID) | ORAL | 0 refills | Status: AC

## 2023-03-29 ENCOUNTER — Encounter: Payer: Self-pay | Admitting: Family Medicine

## 2023-03-29 ENCOUNTER — Ambulatory Visit: Payer: Self-pay | Admitting: Medical-Surgical

## 2023-03-29 LAB — CHLAMYDIA/GONOCOCCUS/TRICHOMONAS, NAA
Chlamydia by NAA: NEGATIVE
Gonococcus by NAA: NEGATIVE
Trich vag by NAA: POSITIVE — AB

## 2023-04-06 ENCOUNTER — Emergency Department (HOSPITAL_COMMUNITY)
Admission: EM | Admit: 2023-04-06 | Discharge: 2023-04-06 | Disposition: A | Discharge status: Home / Self Care | Payer: Self-pay | Attending: Emergency Medicine | Admitting: Emergency Medicine

## 2023-04-06 ENCOUNTER — Encounter (HOSPITAL_COMMUNITY): Payer: Self-pay

## 2023-04-06 DIAGNOSIS — R42 Dizziness and giddiness: Secondary | ICD-10-CM | POA: Insufficient documentation

## 2023-04-06 LAB — CBG MONITORING, ED: Glucose-Capillary: 183 mg/dL — ABNORMAL HIGH (ref 70–99)

## 2023-04-06 MED ORDER — DIAZEPAM 2 MG PO TABS: 2.0000 mg | ORAL_TABLET | Freq: Four times a day (QID) | ORAL | 0 refills | Status: DC | PRN

## 2023-04-06 MED ORDER — DIPHENHYDRAMINE HCL 50 MG/ML IJ SOLN
25.0000 mg | Freq: Once | INTRAMUSCULAR | Status: DC
  Filled 2023-04-06: qty 1

## 2023-04-06 MED ORDER — DIAZEPAM 5 MG PO TABS
5.0000 mg | ORAL_TABLET | Freq: Once | ORAL | Status: AC
  Administered 2023-04-06: 5 mg via ORAL
  Filled 2023-04-06: qty 1

## 2023-04-06 NOTE — ED Notes (Addendum)
I tried to put patient in the bed however she stated "she does not want to lay down because she is dizzy."  Patient sitting on the side of the bed.

## 2023-04-06 NOTE — ED Provider Notes (Signed)
[place] EMERGENCY DEPARTMENT AT [facility] Provider Note   CSN: [id number] Arrival date & time: 04/06/23  1412     History  Chief Complaint  Patient presents with   Dizziness    Sara Simpson is a 37 y.o. female.  37 year old female who presents with worsening vertigo.  Has had this for several months.  Scheduled to see a specialist for this soon.  States that she has room spinning that is worse when she moves her head from side-to-side.  Denies any ear pain or fullness.  No severe headaches.  Used meclizine without relief.  Denies any emesis.  No new focal weakness.       Home Medications Prior to Admission medications   Medication Sig Start Date End Date Taking? Authorizing Provider  blood glucose meter kit and supplies KIT Dispense based on patient and insurance preference. Use up to four times daily as directed. Please include lancets, test strips, control solution. 05/01/21   Christen Butter, NP  Continuous Blood Gluc Sensor (FREESTYLE LIBRE 3 SENSOR) MISC 1 each by Does not apply route every 14 (fourteen) days. 06/20/21   Christen Butter, NP  Continuous Glucose Monitor Sup KIT 1 each by Does not apply route continuous. 06/20/21   Christen Butter, NP  doxycycline (VIBRA-TABS) 100 MG tablet Take 1 tablet (100 mg total) by mouth 2 (two) times daily. 03/04/23   Christen Butter, NP  hydrOXYzine (ATARAX) 10 MG tablet Take 1 tablet (10 mg total) by mouth 3 (three) times daily as needed. 03/04/23   Christen Butter, NP  metFORMIN (GLUCOPHAGE-XR) 500 MG 24 hr tablet TAKE ONE TABLET BY MOUTH ONE TIME DAILY WITH BREAKFAST 03/04/23   Christen Butter, NP  predniSONE (DELTASONE) 20 MG tablet Take 2 tablets (40 mg total) by mouth daily with breakfast. 03/20/23   Waldon Merl, PA-C  Probiotic Product (PROBIOTIC DAILY PO) Take by mouth.    [provider]  promethazine (PHENERGAN) 25 MG tablet Take 1 tablet (25 mg total) by mouth every 8 (eight) hours as needed for nausea or vomiting.  03/20/23   Waldon Merl, PA-C      Allergies    Molds & smuts, Penicillins, and Fluoxetine    Review of Systems   Review of Systems  All other systems reviewed and are negative.   Physical Exam Updated Vital Signs BP (!) 149/95   Pulse 95   Temp 98.1 F (36.7 C) (Oral)   Resp 18   Ht 1.626 m (5\' 4" )   Wt 97.1 kg   LMP 03/23/2023   SpO2 99%   BMI 36.73 kg/m  Physical Exam Vitals and nursing note reviewed.  Constitutional:      General: She is not in acute distress.    Appearance: Normal appearance. She is well-developed. She is not toxic-appearing.  HENT:     Head: Normocephalic and atraumatic.  Eyes:     General: Lids are normal.     Conjunctiva/sclera: Conjunctivae normal.     Pupils: Pupils are equal, round, and reactive to light.  Neck:     Thyroid: No thyroid mass.     Trachea: No tracheal deviation.  Cardiovascular:     Rate and Rhythm: Normal rate and regular rhythm.     Heart sounds: Normal heart sounds. No murmur heard.    No gallop.  Pulmonary:     Effort: Pulmonary effort is normal. No respiratory distress.     Breath sounds: Normal breath sounds. No stridor. No  decreased breath sounds, wheezing, rhonchi or rales.  Abdominal:     General: There is no distension.     Palpations: Abdomen is soft.     Tenderness: There is no abdominal tenderness. There is no rebound.  Musculoskeletal:        General: No tenderness. Normal range of motion.     Cervical back: Normal range of motion and neck supple.  Skin:    General: Skin is warm and dry.     Findings: No abrasion or rash.  Neurological:     Mental Status: She is alert and oriented to person, place, and time. Mental status is at baseline.     GCS: GCS eye subscore is 4. GCS verbal subscore is 5. GCS motor subscore is 6.     Cranial Nerves: Cranial nerves are intact. No cranial nerve deficit.     Sensory: No sensory deficit.     Motor: Motor function is intact.     Comments: Positive horizontal  nystagmus  Psychiatric:        Attention and Perception: Attention normal.        Speech: Speech normal.        Behavior: Behavior normal.     ED Results / Procedures / Treatments   Labs (all labs ordered are listed, but only abnormal results are displayed) Labs Reviewed  CBG MONITORING, ED - Abnormal; Notable for the following components:      Result Value   Glucose-Capillary 183 (*)    All other components within normal limits    EKG EKG Interpretation Date/Time:  Saturday April 06 2023 14:30:33 EDT Ventricular Rate:  98 PR Interval:  138 QRS Duration:  87 QT Interval:  360 QTC Calculation: 460 R Axis:   73  Text Interpretation: Sinus arrhythmia Confirmed by Lorre Nick ([id number]) on 04/06/2023 4:39:15 PM  Radiology No results found.  Procedures Procedures    Medications Ordered in ED Medications  diazepam (VALIUM) tablet 5 mg (has no administration in time range)  diphenhydrAMINE (BENADRYL) injection 25 mg (has no administration in time range)    ED Course/ Medical Decision Making/ A&P                                 Medical Decision Making Risk Prescription drug management.   Patient with symptoms classic for peripheral vertigo.  Her gait is normal.  Given Valium and feels much better.  Her EKG shows normal sinus rhythm.  No evidence of A-fib.  Will give prescription for Valium and she will follow-up with her doctor as needed        Final Clinical Impression(s) / ED Diagnoses Final diagnoses:  None    Rx / DC Orders ED Discharge Orders     None         Lorre Nick, MD 04/06/23 1639

## 2023-04-06 NOTE — ED Triage Notes (Signed)
Patient diagnosed with vertigo In July. Has sporadic episodes for last two weeks. Today woke up and it was worse; says she feels dizzy, disoriented, can't think straight, has light sensitivity, shaking Denies pain  Has diabetes, has not checked glucose today, says she has run out of lancets

## 2023-04-09 ENCOUNTER — Other Ambulatory Visit: Payer: Self-pay | Admitting: Family Medicine

## 2023-04-09 DIAGNOSIS — R42 Dizziness and giddiness: Secondary | ICD-10-CM

## 2023-04-09 MED ORDER — MECLIZINE HCL 25 MG PO TABS
25.0000 mg | ORAL_TABLET | Freq: Two times a day (BID) | ORAL | 0 refills | Status: DC | PRN
Start: 2023-04-09 — End: 2023-11-19

## 2023-04-18 ENCOUNTER — Institutional Professional Consult (permissible substitution) (INDEPENDENT_AMBULATORY_CARE_PROVIDER_SITE_OTHER): Payer: Self-pay

## 2023-05-09 ENCOUNTER — Institutional Professional Consult (permissible substitution) (INDEPENDENT_AMBULATORY_CARE_PROVIDER_SITE_OTHER): Payer: Self-pay

## 2023-05-09 ENCOUNTER — Ambulatory Visit (INDEPENDENT_AMBULATORY_CARE_PROVIDER_SITE_OTHER): Payer: Self-pay | Admitting: Audiology

## 2023-06-04 ENCOUNTER — Other Ambulatory Visit: Payer: Self-pay

## 2023-06-04 ENCOUNTER — Emergency Department (HOSPITAL_COMMUNITY)
Admission: EM | Admit: 2023-06-04 | Discharge: 2023-06-04 | Discharge status: Against Medical Advice | Payer: Self-pay | Attending: Medical | Admitting: Medical

## 2023-06-04 DIAGNOSIS — K610 Anal abscess: Secondary | ICD-10-CM | POA: Insufficient documentation

## 2023-06-04 DIAGNOSIS — Z5321 Procedure and treatment not carried out due to patient leaving prior to being seen by health care provider: Secondary | ICD-10-CM | POA: Insufficient documentation

## 2023-06-04 LAB — CBC WITH DIFFERENTIAL/PLATELET
Abs Immature Granulocytes: 0.02 10*3/uL (ref 0.00–0.07)
Basophils Absolute: 0 10*3/uL (ref 0.0–0.1)
Basophils Relative: 1 %
Eosinophils Absolute: 0.2 10*3/uL (ref 0.0–0.5)
Eosinophils Relative: 3 %
HCT: 41 % (ref 36.0–46.0)
Hemoglobin: 14.3 g/dL (ref 12.0–15.0)
Immature Granulocytes: 0 %
Lymphocytes Relative: 44 %
Lymphs Abs: 3.3 10*3/uL (ref 0.7–4.0)
MCH: 38.1 pg — ABNORMAL HIGH (ref 26.0–34.0)
MCHC: 34.9 g/dL (ref 30.0–36.0)
MCV: 109.3 fL — ABNORMAL HIGH (ref 80.0–100.0)
Monocytes Absolute: 0.6 10*3/uL (ref 0.1–1.0)
Monocytes Relative: 7 %
Neutro Abs: 3.4 10*3/uL (ref 1.7–7.7)
Neutrophils Relative %: 45 %
Platelets: 200 10*3/uL (ref 150–400)
RBC: 3.75 MIL/uL — ABNORMAL LOW (ref 3.87–5.11)
RDW: 13.2 % (ref 11.5–15.5)
WBC: 7.5 10*3/uL (ref 4.0–10.5)
nRBC: 0 % (ref 0.0–0.2)

## 2023-06-04 LAB — COMPREHENSIVE METABOLIC PANEL
ALT: 54 U/L — ABNORMAL HIGH (ref 0–44)
AST: 54 U/L — ABNORMAL HIGH (ref 15–41)
Albumin: 3.8 g/dL (ref 3.5–5.0)
Alkaline Phosphatase: 67 U/L (ref 38–126)
Anion gap: 10 (ref 5–15)
BUN: 11 mg/dL (ref 6–20)
CO2: 20 mmol/L — ABNORMAL LOW (ref 22–32)
Calcium: 9.6 mg/dL (ref 8.9–10.3)
Chloride: 103 mmol/L (ref 98–111)
Creatinine, Ser: 0.73 mg/dL (ref 0.44–1.00)
GFR, Estimated: >60 mL/min (ref >60)
Glucose, Bld: 126 mg/dL — ABNORMAL HIGH (ref 70–99)
Potassium: 3.8 mmol/L (ref 3.5–5.1)
Sodium: 133 mmol/L — ABNORMAL LOW (ref 135–145)
Total Bilirubin: 1.2 mg/dL — ABNORMAL HIGH (ref <1.2)
Total Protein: 8.1 g/dL (ref 6.5–8.1)

## 2023-06-04 LAB — HCG, QUANTITATIVE, PREGNANCY: hCG, Beta Chain, Quant, S: <1 m[IU]/mL (ref <5)

## 2023-06-04 NOTE — ED Provider Triage Note (Signed)
Emergency Medicine Provider Triage Evaluation Note  [facility] Sara Simpson , a 37 y.o. female  was evaluated in triage.  Pt complains of abscess around anusx2 months. No fevers or chills. Recurrently draining.  Review of Systems  Positive: wound Negative: fevers  Physical Exam  BP (!) 148/98 (BP Location: Right Arm)   Pulse 95   Temp 98.8 F (37.1 C) (Oral)   Resp 16   Ht 5\' 4"  (1.626 m)   Wt 98.4 kg   LMP 05/13/2023   SpO2 98%   BMI 37.25 kg/m  Gen:   Awake, no distress   Resp:  Normal effort  MSK:   Moves extremities without difficulty  Other:  Deferred sensitive exam in triage  Medical Decision Making  Medically screening exam initiated at 1:44 PM.  Appropriate orders placed.  [facility] Ben Colyer was informed that the remainder of the evaluation will be completed by another provider, this initial triage assessment does not replace that evaluation, and the importance of remaining in the ED until their evaluation is complete.    Pete Pelt, [place] 06/04/23 1346

## 2023-06-04 NOTE — ED Triage Notes (Addendum)
Pt. Stated, I've had an abscess for 2 months right at my anus. I was on Doxycycline and finished up 3 weeks ago and it helped but right back. Its very painful . Pt is requesting a female.

## 2023-06-04 NOTE — ED Notes (Signed)
 Pt not answering to call for vitals in the lobby

## 2023-06-07 ENCOUNTER — Institutional Professional Consult (permissible substitution) (INDEPENDENT_AMBULATORY_CARE_PROVIDER_SITE_OTHER): Payer: Self-pay

## 2023-07-04 ENCOUNTER — Encounter (INDEPENDENT_AMBULATORY_CARE_PROVIDER_SITE_OTHER): Payer: Self-pay

## 2023-07-04 NOTE — Progress Notes (Deleted)
  [address], [address] [place], [place] [address] [phone]  Audiological Evaluation    Name: Sara Simpson     DOB:   1985-09-11      MRN:   [id number]                                                                                     Service Date: 07/04/2023     Accompanied by: ***   Patient comes today after Dr. Tobie, ENT sent a referral for a hearing evaluation due to concerns with {audiology symptoms:31224}.   Symptoms Yes Details  Hearing loss  []    Tinnitus  []    Ear pain/ Ear infections  []    Balance problems  []    Noise exposure  []    Previous ear surgeries  []    Family history  []    Amplification  []    Other  []      Otoscopy: Right ear: {otoscopy:31227} Left ear:  {otoscopy:31227}  Tympanometry: Right ear: {tympanometry results:31367} Left ear: {tympanometry results:31367}   Pure tone Audiometry: Right ear- *** {hearing loss types:31372::sensorineural hearing loss} from *** Hz - *** Hz. Left ear-  *** {hearing loss types:31372::sensorineural hearing loss} from *** Hz - *** Hz.  The hearing test results were completed under {transducer options:31388} and results are deemed to be of {test reliability:31390::good reliability}. Test technique:  {audiometric test technique:31400::conventional}     Speech Audiometry: Right ear- {AUD SRT/SAT:19348} Left ear-{AUD SRT/SAT:19348}   Word Recognition Score Tested using Spanish list (MLV) Right ear: ***% was obtained at a presentation level of *** dBHL with contralateral masking which is deemed as  {word recognition score:31373} Left ear: ***% was obtained at a presentation level of *** dBHL with contralateral masking which is deemed as  {word recognition score:31373}    Impression: {Word recognition Score interpretation:31432::There is not a significant difference in pure-tone thresholds between ears.,There is not a significant difference in the word recognition score in between  ears. }   Recommendations: {Audiology Recommendations:31370::Follow up with ENT as scheduled for today.}   Kambrea Carrasco MARIE LEROUX-MARTINEZ, AUD

## 2023-07-05 ENCOUNTER — Encounter (INDEPENDENT_AMBULATORY_CARE_PROVIDER_SITE_OTHER): Payer: Self-pay

## 2023-07-05 ENCOUNTER — Institutional Professional Consult (permissible substitution) (INDEPENDENT_AMBULATORY_CARE_PROVIDER_SITE_OTHER): Payer: Self-pay

## 2023-07-05 ENCOUNTER — Ambulatory Visit (INDEPENDENT_AMBULATORY_CARE_PROVIDER_SITE_OTHER): Payer: Self-pay | Admitting: Audiology

## 2023-07-29 ENCOUNTER — Emergency Department (HOSPITAL_COMMUNITY): Payer: Self-pay

## 2023-07-29 ENCOUNTER — Other Ambulatory Visit: Payer: Self-pay

## 2023-07-29 ENCOUNTER — Emergency Department (HOSPITAL_COMMUNITY)
Admission: EM | Admit: 2023-07-29 | Discharge: 2023-07-30 | Disposition: A | Discharge status: Home / Self Care | Payer: Self-pay | Attending: Emergency Medicine | Admitting: Emergency Medicine

## 2023-07-29 DIAGNOSIS — D7589 Other specified diseases of blood and blood-forming organs: Secondary | ICD-10-CM | POA: Insufficient documentation

## 2023-07-29 DIAGNOSIS — E876 Hypokalemia: Secondary | ICD-10-CM | POA: Insufficient documentation

## 2023-07-29 DIAGNOSIS — Z20822 Contact with and (suspected) exposure to covid-19: Secondary | ICD-10-CM | POA: Insufficient documentation

## 2023-07-29 DIAGNOSIS — Z7984 Long term (current) use of oral hypoglycemic drugs: Secondary | ICD-10-CM | POA: Insufficient documentation

## 2023-07-29 DIAGNOSIS — E119 Type 2 diabetes mellitus without complications: Secondary | ICD-10-CM | POA: Insufficient documentation

## 2023-07-29 DIAGNOSIS — J209 Acute bronchitis, unspecified: Secondary | ICD-10-CM | POA: Insufficient documentation

## 2023-07-29 LAB — RESP PANEL BY RT-PCR (RSV, FLU A&B, COVID)  RVPGX2
Influenza A by PCR: NEGATIVE
Influenza B by PCR: NEGATIVE
Resp Syncytial Virus by PCR: NEGATIVE
SARS Coronavirus 2 by RT PCR: NEGATIVE

## 2023-07-29 LAB — CBC
HCT: 40.5 % (ref 36.0–46.0)
Hemoglobin: 14.1 g/dL (ref 12.0–15.0)
MCH: 39.3 pg — ABNORMAL HIGH (ref 26.0–34.0)
MCHC: 34.8 g/dL (ref 30.0–36.0)
MCV: 112.8 fL — ABNORMAL HIGH (ref 80.0–100.0)
Platelets: 171 10*3/uL (ref 150–400)
RBC: 3.59 MIL/uL — ABNORMAL LOW (ref 3.87–5.11)
RDW: 13.1 % (ref 11.5–15.5)
WBC: 5.2 10*3/uL (ref 4.0–10.5)
nRBC: 0 % (ref 0.0–0.2)

## 2023-07-29 LAB — HCG, SERUM, QUALITATIVE: Preg, Serum: NEGATIVE

## 2023-07-29 NOTE — ED Triage Notes (Signed)
Pt came in for chest pain, SOB and productive cough for two weeks.

## 2023-07-29 NOTE — ED Provider Triage Note (Signed)
 Emergency Medicine Provider Triage Evaluation Note  [facility] Sara Simpson , a 38 y.o. female  was evaluated in triage.  Pt complains of shortness of breath, chest pain.  Reports has been ongoing for about 2 weeks.  Denies any recent viral illness or fevers, chills or bodyaches. States she has some vomiting but no diarrhea.  Review of Systems  Positive: As above  Negative: As above  Physical Exam  Ht 5\' 4"  (1.626 m)   Wt 99.3 kg   BMI 37.59 kg/m  Gen:   Awake, no distress  Resp:  Normal effort, CTAB MSK:   Moves extremities without difficulty  Other:    Medical Decision Making  Medically screening exam initiated at 8:47 PM.  Appropriate orders placed.  [facility] Sara Simpson was informed that the remainder of the evaluation will be completed by another provider, this initial triage assessment does not replace that evaluation, and the importance of remaining in the ED until their evaluation is complete.    Smitty Knudsen, PA-C 07/29/23 2052

## 2023-07-30 LAB — BASIC METABOLIC PANEL
Anion gap: 11 (ref 5–15)
BUN: 16 mg/dL (ref 6–20)
CO2: 25 mmol/L (ref 22–32)
Calcium: 9.5 mg/dL (ref 8.9–10.3)
Chloride: 100 mmol/L (ref 98–111)
Creatinine, Ser: 0.58 mg/dL (ref 0.44–1.00)
GFR, Estimated: >60 mL/min (ref >60)
Glucose, Bld: 122 mg/dL — ABNORMAL HIGH (ref 70–99)
Potassium: 3.4 mmol/L — ABNORMAL LOW (ref 3.5–5.1)
Sodium: 136 mmol/L (ref 135–145)

## 2023-07-30 LAB — CBG MONITORING, ED: Glucose-Capillary: 141 mg/dL — ABNORMAL HIGH (ref 70–99)

## 2023-07-30 LAB — TROPONIN I (HIGH SENSITIVITY): Troponin I (High Sensitivity): <2 ng/L (ref <18)

## 2023-07-30 MED ORDER — IPRATROPIUM-ALBUTEROL 0.5-2.5 (3) MG/3ML IN SOLN
3.0000 mL | Freq: Once | RESPIRATORY_TRACT | Status: AC
  Administered 2023-07-30: 3 mL via RESPIRATORY_TRACT
  Filled 2023-07-30: qty 3

## 2023-07-30 MED ORDER — PREDNISONE 20 MG PO TABS
40.0000 mg | ORAL_TABLET | Freq: Once | ORAL | Status: AC
  Administered 2023-07-30: 40 mg via ORAL
  Filled 2023-07-30: qty 2

## 2023-07-30 MED ORDER — ALBUTEROL SULFATE HFA 108 (90 BASE) MCG/ACT IN AERS
2.0000 | INHALATION_SPRAY | RESPIRATORY_TRACT | Status: DC | PRN
  Administered 2023-07-30: 2 via RESPIRATORY_TRACT
  Filled 2023-07-30: qty 6.7

## 2023-07-30 MED ORDER — PREDNISONE 20 MG PO TABS: ORAL_TABLET | ORAL | 0 refills | Status: DC

## 2023-07-30 MED ORDER — POTASSIUM CHLORIDE CRYS ER 20 MEQ PO TBCR
40.0000 meq | EXTENDED_RELEASE_TABLET | Freq: Once | ORAL | Status: AC
  Administered 2023-07-30: 40 meq via ORAL
  Filled 2023-07-30: qty 2

## 2023-07-30 MED ORDER — ALBUTEROL SULFATE HFA 108 (90 BASE) MCG/ACT IN AERS: 2.0000 | INHALATION_SPRAY | RESPIRATORY_TRACT | Status: DC | PRN

## 2023-07-30 NOTE — ED Notes (Signed)
Sent lt green to lab.

## 2023-07-30 NOTE — ED Provider Notes (Signed)
 [place] EMERGENCY DEPARTMENT AT [facility] Provider Note   CSN: [id number] Arrival date & time: 07/29/23  1936     History  Chief Complaint  Patient presents with   Chest Pain   Shortness of Breath    [facility] Sara Simpson is a 38 y.o. female.  The history is provided by the patient.  Chest Pain Associated symptoms: shortness of breath   Shortness of Breath Associated symptoms: chest pain   She has history of diabetes and comes in complaining of chest pain and shortness of breath over the last 2 weeks.  She has had a cough productive of white sputum.  She denies fever, chills, sweats.  Symptoms are worse with exertion but she is still short of breath at rest.  She has noted some wheezing.  She describes her pain as pressure feeling with occasional sharp, stabbing pains.  She denies pains anywhere else.  Symptoms have been getting worse.  She has not tried any treatments at home to because her primary care provider told her that she should not use over-the-counter medications because of her diabetes.   Home Medications Prior to Admission medications   Medication Sig Start Date End Date Taking? Authorizing Provider  blood glucose meter kit and supplies KIT Dispense based on patient and insurance preference. Use up to four times daily as directed. Please include lancets, test strips, control solution. 05/01/21   Willo Mini, NP  Continuous Blood Gluc Sensor (FREESTYLE LIBRE 3 SENSOR) MISC 1 each by Does not apply route every 14 (fourteen) days. 06/20/21   Willo Mini, NP  Continuous Glucose Monitor Sup KIT 1 each by Does not apply route continuous. 06/20/21   Willo Mini, NP  diazepam  (VALIUM ) 2 MG tablet Take 1 tablet (2 mg total) by mouth every 6 (six) hours as needed (Dizziness). 04/06/23   Dasie Faden, MD  doxycycline  (VIBRA -TABS) 100 MG tablet Take 1 tablet (100 mg total) by mouth 2 (two) times daily. 03/04/23   Willo Mini, NP  hydrOXYzine  (ATARAX ) 10 MG  tablet Take 1 tablet (10 mg total) by mouth 3 (three) times daily as needed. 03/04/23   Willo Mini, NP  meclizine  (ANTIVERT ) 25 MG tablet Take 1 tablet (25 mg total) by mouth 2 (two) times daily as needed for dizziness. 04/09/23   Bevin Bernice RAMAN, DO  metFORMIN  (GLUCOPHAGE -XR) 500 MG 24 hr tablet TAKE ONE TABLET BY MOUTH ONE TIME DAILY WITH BREAKFAST 03/04/23   Willo Mini, NP  predniSONE  (DELTASONE ) 20 MG tablet Take 2 tablets (40 mg total) by mouth daily with breakfast. 03/20/23   Gladis Elsie BROCKS, PA-C  Probiotic Product (PROBIOTIC DAILY PO) Take by mouth.    [provider]  promethazine  (PHENERGAN ) 25 MG tablet Take 1 tablet (25 mg total) by mouth every 8 (eight) hours as needed for nausea or vomiting. 03/20/23   Gladis Elsie BROCKS, PA-C      Allergies    Molds & smuts, Penicillins, and Fluoxetine    Review of Systems   Review of Systems  Respiratory:  Positive for shortness of breath.   Cardiovascular:  Positive for chest pain.  All other systems reviewed and are negative.   Physical Exam Updated Vital Signs BP 130/87   Pulse 87   Temp 99.3 F (37.4 C) (Oral)   Resp 16   Ht 5' 4 (1.626 m)   Wt 99.3 kg   SpO2 98%   BMI 37.59 kg/m  Physical Exam Vitals and nursing note reviewed.  38 year old female, resting comfortably and in no acute distress. Vital signs are normal. Oxygen saturation is 98%, which is normal. Head is normocephalic and atraumatic. PERRLA, EOMI. Oropharynx is clear. Neck is nontender and supple without adenopathy. Lungs have coarse expiratory wheezes.  There are no rales or rhonchi. Chest is nontender. Heart has regular rate and rhythm without murmur. Abdomen is soft, flat, nontender. Extremities have no cyanosis or edema, full range of motion is present. Skin is warm and dry without rash. Neurologic: Mental status is normal, cranial nerves are intact, moves all extremities equally.  ED Results / Procedures / Treatments   Labs (all labs ordered  are listed, but only abnormal results are displayed) Labs Reviewed  CBC - Abnormal; Notable for the following components:      Result Value   RBC 3.59 (*)    MCV 112.8 (*)    MCH 39.3 (*)    All other components within normal limits  RESP PANEL BY RT-PCR (RSV, FLU A&B, COVID)  RVPGX2  HCG, SERUM, QUALITATIVE  BASIC METABOLIC PANEL  TROPONIN I (HIGH SENSITIVITY)  TROPONIN I (HIGH SENSITIVITY)    EKG EKG Interpretation Date/Time:  Monday July 29 2023 20:29:43 EST Ventricular Rate:  87 PR Interval:  136 QRS Duration:  83 QT Interval:  358 QTC Calculation: 431 R Axis:   80  Text Interpretation: Sinus rhythm Normal ECG When compared with ECG of 04/06/2023, No significant change was found Confirmed by Raford Lenis ([id number]) on 07/30/2023 1:00:43 AM  Radiology DG Chest 2 View Result Date: 07/29/2023 CLINICAL DATA:  Productive cough for 2 weeks. Shortness of breath and chest pain. EXAM: CHEST - 2 VIEW COMPARISON:  None Available. FINDINGS: The heart size and mediastinal contours are within normal limits. Both lungs are clear. The visualized skeletal structures are unremarkable. IMPRESSION: No active cardiopulmonary disease. Electronically Signed   By: Norleen DELENA Kil M.D.   On: 07/29/2023 20:55    Procedures Procedures  Cardiac monitor shows normal sinus rhythm, per my interpretation.  Medications Ordered in ED Medications  albuterol  (VENTOLIN  HFA) 108 (90 Base) MCG/ACT inhaler 2 puff (has no administration in time range)  ipratropium-albuterol  (DUONEB) 0.5-2.5 (3) MG/3ML nebulizer solution 3 mL (3 mLs Nebulization Given 07/30/23 0220)  ipratropium-albuterol  (DUONEB) 0.5-2.5 (3) MG/3ML nebulizer solution 3 mL (3 mLs Nebulization Given 07/30/23 0315)  potassium chloride  SA (KLOR-CON  M) CR tablet 40 mEq (40 mEq Oral Given 07/30/23 0315)  predniSONE  (DELTASONE ) tablet 40 mg (40 mg Oral Given 07/30/23 0315)  ipratropium-albuterol  (DUONEB) 0.5-2.5 (3) MG/3ML nebulizer solution 3 mL (3 mLs  Nebulization Given 07/30/23 0422)    ED Course/ Medical Decision Making/ A&P                                 Medical Decision Making Amount and/or Complexity of Data Reviewed Labs: ordered. Radiology: ordered.  Risk Prescription drug management.   Cough with chest pain and dyspnea-pneumonia versus bronchitis.  Chest x-ray shows no evidence of pneumonia.  Have independently viewed the images, and agree with radiologist's interpretation.  I have reviewed her electrocardiogram, and my interpretation is normal ECG.  I have reviewed her laboratory test, my interpretation is normal WBC, normal hemoglobin, macrocytosis which had been present previously, normal platelet count.  Respiratory panel is negative for influenza, RSV, COVID-19.  Physical exam is consistent with bronchospasm and I feel that she has an underlying viral bronchitis.  I have ordered capillary  blood glucose, I have ordered a nebulizer treatment with albuterol  and ipratropium.  Patient relates that she did have asthma as a child.  Following nebulizer treatment, patient noted little change.  However, on my exam there is improved airflow and slightly decreased wheezing.  I have reviewed her basic metabolic panel, and my interpretation is mild hypokalemia and have ordered a dose of oral potassium, glucose only minimally elevated.  I feel that she can tolerate low to moderate dose prednisone .  I have ordered a dose of prednisone  and a repeat nebulizer treatment.  Following second nebulizer treatment, patient did note the wheezing is less but states that she still feels tightness in her chest and that her cough is actually worse.  On reexam, there is definitely improved airflow and diminished wheezing.  I have ordered a third nebulizer treatment.  Following third nebulizer treatment, patient noted significant improvement.  On reexam, lungs are clear.  I am discharging her with an albuterol  inhaler and I have given a prescription for short  course of prednisone .  Final Clinical Impression(s) / ED Diagnoses Final diagnoses:  Acute bronchitis, unspecified organism  Hypokalemia  Macrocytosis without anemia    Rx / DC Orders ED Discharge Orders          Ordered    predniSONE  (DELTASONE ) 20 MG tablet        07/30/23 0527    albuterol  (VENTOLIN  HFA) 108 (90 Base) MCG/ACT inhaler  Every 4 hours PRN        07/30/23 0527              Raford Lenis, MD 07/30/23 0530

## 2023-07-30 NOTE — Discharge Instructions (Addendum)
 Drink plenty of fluids.  You may take cough medicine with dextromethorphan as needed to help control your cough.  Use the inhaler-2 puffs at a time, every 4-6 hours, as needed to help with shortness of breath and cough.  You have been given a prescription for prednisone .  This may push your blood sugars higher.  Please monitor your blood sugars while you are taking prednisone .  Return if your symptoms are getting worse.

## 2023-08-05 ENCOUNTER — Telehealth: Payer: Self-pay | Admitting: Physician Assistant

## 2023-08-05 ENCOUNTER — Encounter: Payer: Self-pay | Admitting: Physician Assistant

## 2023-08-05 DIAGNOSIS — R059 Cough, unspecified: Secondary | ICD-10-CM

## 2023-08-05 MED ORDER — PREDNISONE 50 MG PO TABS: 50.0000 mg | ORAL_TABLET | Freq: Every day | ORAL | 0 refills | Status: AC

## 2023-08-05 MED ORDER — BENZONATATE 100 MG PO CAPS: 100.0000 mg | ORAL_CAPSULE | Freq: Three times a day (TID) | ORAL | 0 refills | Status: AC

## 2023-08-05 MED ORDER — DOXYCYCLINE HYCLATE 100 MG PO CAPS: 100.0000 mg | ORAL_CAPSULE | Freq: Two times a day (BID) | ORAL | 0 refills | Status: AC

## 2023-08-05 NOTE — Progress Notes (Signed)
 Sara Simpson, Sara Simpson are scheduled for a virtual visit with your provider today.    Just as we do with appointments in the office, we must obtain your consent to participate.  Your consent will be active for this visit and any virtual visit you may have with one of our providers in the next 365 days.    If you have a MyChart account, I can also send a copy of this consent to you electronically.  All virtual visits are billed to your insurance company just like a traditional visit in the office.  As this is a virtual visit, video technology does not allow for your provider to perform a traditional examination.  This may limit your provider's ability to fully assess your condition.  If your provider identifies any concerns that need to be evaluated in person or the need to arrange testing such as labs, EKG, etc, we will make arrangements to do so.    Although advances in technology are sophisticated, we cannot ensure that it will always work on either your end or our end.  If the connection with a video visit is poor, we may have to switch to a telephone visit.  With either a video or telephone visit, we are not always able to ensure that we have a secure connection.   I need to obtain your verbal consent now.   Are you willing to proceed with your visit today?   [facility] Keimoni Onofrey has provided verbal consent on 08/05/2023 for a virtual visit (video or telephone).   Aminta Kales, [place] 08/05/2023  3:15 PM   Date:  08/05/2023   ID:  Sara Simpson [name], DOB [birth year], MRN [id number]  Patient Location: Home Provider Location: Home Office   Participants: Patient and Provider for Visit and Wrap up  Method of visit: Video  Location of Patient: Home Location of Provider: Home Office Consent was obtain for visit over the video. Services rendered by provider: Visit was performed via video  A video enabled telemedicine application was used and I verified that I am speaking with  the correct person using two identifiers.  PCP:  Cherre Cornish, NP   Chief Complaint:  cough  History of Present Illness:    Sara Simpson [place] Kienholz is a 38 y.o. female with history as stated below. Presents video telehealth for an acute care visit  Pt states she was seen in the ED 5 days ago for sob and cough. She was diagnosed with bronchitis and started on steroids and an inhaler. She notes that her symptoms have improved significantly but have not completely resolved. She reports continued wheezing. States her SOB is still present but has improved somewhat. Denies chest pain.  Denies fevers  Past Medical, Surgical, Social History, Allergies, and Medications have been Reviewed.  Past Medical History:  Diagnosis Date   Anxiety    Asthma    Eczema    History of anemia    Hypertension    Palpitations    Transaminitis     Current Meds  Medication Sig   benzonatate  (TESSALON ) 100 MG capsule Take 1 capsule (100 mg total) by mouth every 8 (eight) hours for 5 days.   doxycycline  (VIBRAMYCIN ) 100 MG capsule Take 1 capsule (100 mg total) by mouth 2 (two) times daily for 7 days.   predniSONE  (DELTASONE ) 50 MG tablet Take 1 tablet (50 mg total) by mouth daily with breakfast for 3 days.     Allergies:   Molds & smuts,  Penicillins, and Fluoxetine   ROS See HPI for history of present illness.  Physical Exam Constitutional:      General: She is not in acute distress.    Appearance: Normal appearance. She is not ill-appearing or toxic-appearing.  Pulmonary:     Comments: Speaking in full sentences             MDM: Pt with continued cough after being tx for bronchitis, still reporting mild wheezing and sob. Is well appearing on my eval, reports no fevers. No respiratory distress. Will given additional short course of steroids, cough medication, and abx to cover for potential bacterial infection in light of recent increase in mycoplasma cases. Anticipatory guidance regarding  persistent cough with bronchitis was given. Strict precautions for in person eval were given shoulder sxs worsen or fail to improve.    Tests Ordered: No orders of the defined types were placed in this encounter.   Medication Changes: Meds ordered this encounter  Medications   doxycycline  (VIBRAMYCIN ) 100 MG capsule    Sig: Take 1 capsule (100 mg total) by mouth 2 (two) times daily for 7 days.    Dispense:  14 capsule    Refill:  0   predniSONE  (DELTASONE ) 50 MG tablet    Sig: Take 1 tablet (50 mg total) by mouth daily with breakfast for 3 days.    Dispense:  3 tablet    Refill:  0   benzonatate  (TESSALON ) 100 MG capsule    Sig: Take 1 capsule (100 mg total) by mouth every 8 (eight) hours for 5 days.    Dispense:  15 capsule    Refill:  0     Disposition:  Follow up  Signed, Rayya Yagi Lauren Ponds, PA-C  08/05/2023 3:15 PM

## 2023-08-05 NOTE — Patient Instructions (Signed)
 [facility] Dorothy Gates, thank you for joining Aminta Kales, PA-C for today's virtual visit.  While this provider is not your primary care provider (PCP), if your PCP is located in our provider database this encounter information will be shared with them immediately following your visit.   A [facility] MyChart account gives you access to today's visit and all your visits, tests, and labs performed at [facility] " click here if you don't have a [facility] MyChart account or go to [email or web address]  Consent: (Patient) [name] Yuli Beem provided verbal consent for this virtual visit at the beginning of the encounter.  Current Medications:  Current Outpatient Medications:    albuterol  (VENTOLIN  HFA) 108 (90 Base) MCG/ACT inhaler, Inhale 2 puffs into the lungs every 4 (four) hours as needed for wheezing or shortness of breath., Disp: , Rfl:    blood glucose meter kit and supplies KIT, Dispense based on patient and insurance preference. Use up to four times daily as directed. Please include lancets, test strips, control solution., Disp: 1 each, Rfl: 0   Continuous Blood Gluc Sensor (FREESTYLE LIBRE 3 SENSOR) MISC, 1 each by Does not apply route every 14 (fourteen) days., Disp: 6 each, Rfl: 1   Continuous Glucose Monitor Sup KIT, 1 each by Does not apply route continuous., Disp: 1 kit, Rfl: 0   diazepam  (VALIUM ) 2 MG tablet, Take 1 tablet (2 mg total) by mouth every 6 (six) hours as needed (Dizziness)., Disp: 15 tablet, Rfl: 0   meclizine  (ANTIVERT ) 25 MG tablet, Take 1 tablet (25 mg total) by mouth 2 (two) times daily as needed for dizziness., Disp: 30 tablet, Rfl: 0   metFORMIN  (GLUCOPHAGE -XR) 500 MG 24 hr tablet, TAKE ONE TABLET BY MOUTH ONE TIME DAILY WITH BREAKFAST, Disp: 30 tablet, Rfl: 5   predniSONE  (DELTASONE ) 20 MG tablet, 2 tabs po daily x 4 days, Disp: 8 tablet, Rfl: 0   promethazine  (PHENERGAN ) 25 MG tablet, Take 1 tablet (25 mg total) by mouth  every 8 (eight) hours as needed for nausea or vomiting., Disp: 20 tablet, Rfl: 0   Medications ordered in this encounter:  No orders of the defined types were placed in this encounter.    *If you need refills on other medications prior to your next appointment, please contact your pharmacy*  Follow-Up: Call back or seek an in-person evaluation if the symptoms worsen or if the condition fails to improve as anticipated.  [facility] [phone]  Other Instructions You were given a prescription for antibiotics. Please take the antibiotic prescription fully.   Take the tessalon  for cough and steroids for wheezing. Continue your inahler  Follow up with your regular doctor in 1 week for reassessment and seek care sooner if your symptoms worsen or fail to improve.   If you have been instructed to have an in-person evaluation today at a local Urgent Care facility, please use the link below. It will take you to a list of all of our available [facility], including address, phone number and hours of operation. Please do not delay care.  [facility]  If you or a family member do not have a primary care provider, use the link below to schedule a visit and establish care. When you choose a [facility] primary care physician or advanced practice provider, you gain a long-term partner in health. Find a Primary Care Provider  Learn more about 's in-office and virtual care options:  [facility] Get Care Now

## 2023-08-16 ENCOUNTER — Encounter: Payer: Self-pay | Admitting: Medical-Surgical

## 2023-08-16 DIAGNOSIS — B9689 Other specified bacterial agents as the cause of diseases classified elsewhere: Secondary | ICD-10-CM

## 2023-08-16 DIAGNOSIS — J208 Acute bronchitis due to other specified organisms: Secondary | ICD-10-CM

## 2023-08-16 MED ORDER — AZITHROMYCIN 250 MG PO TABS: ORAL_TABLET | ORAL | 0 refills | Status: AC

## 2023-08-16 NOTE — Telephone Encounter (Signed)
Please see the MyChart message reply(ies) for my assessment and plan.    This patient gave consent for this Medical Advice Message and is aware that it may result in a bill to their insurance company, as well as the possibility of receiving a bill for a co-payment or deductible. They are an established patient, but are not seeking medical advice exclusively about a problem treated during an in person or video visit in the last seven days. I did not recommend an in person or video visit within seven days of my reply.    I spent a total of 7 minutes cumulative time within 7 days through MyChart messaging.  Derek Laughter, NP   

## 2023-08-19 ENCOUNTER — Emergency Department (HOSPITAL_COMMUNITY)
Admission: EM | Admit: 2023-08-19 | Discharge: 2023-08-19 | Disposition: A | Discharge status: Home / Self Care | Payer: Self-pay | Attending: Emergency Medicine | Admitting: Emergency Medicine

## 2023-08-19 ENCOUNTER — Encounter (HOSPITAL_COMMUNITY): Payer: Self-pay

## 2023-08-19 ENCOUNTER — Other Ambulatory Visit: Payer: Self-pay

## 2023-08-19 ENCOUNTER — Emergency Department (HOSPITAL_COMMUNITY): Payer: Self-pay

## 2023-08-19 DIAGNOSIS — R0602 Shortness of breath: Secondary | ICD-10-CM | POA: Insufficient documentation

## 2023-08-19 DIAGNOSIS — Z7984 Long term (current) use of oral hypoglycemic drugs: Secondary | ICD-10-CM | POA: Insufficient documentation

## 2023-08-19 DIAGNOSIS — Z79899 Other long term (current) drug therapy: Secondary | ICD-10-CM | POA: Insufficient documentation

## 2023-08-19 DIAGNOSIS — E119 Type 2 diabetes mellitus without complications: Secondary | ICD-10-CM | POA: Insufficient documentation

## 2023-08-19 DIAGNOSIS — I10 Essential (primary) hypertension: Secondary | ICD-10-CM | POA: Insufficient documentation

## 2023-08-19 HISTORY — DX: Type 2 diabetes mellitus without complications: E11.9

## 2023-08-19 LAB — BASIC METABOLIC PANEL
Anion gap: 11 (ref 5–15)
BUN: 13 mg/dL (ref 6–20)
CO2: 21 mmol/L — ABNORMAL LOW (ref 22–32)
Calcium: 9 mg/dL (ref 8.9–10.3)
Chloride: 105 mmol/L (ref 98–111)
Creatinine, Ser: 0.44 mg/dL (ref 0.44–1.00)
GFR, Estimated: >60 mL/min (ref >60)
Glucose, Bld: 198 mg/dL — ABNORMAL HIGH (ref 70–99)
Potassium: 3.5 mmol/L (ref 3.5–5.1)
Sodium: 137 mmol/L (ref 135–145)

## 2023-08-19 LAB — CBC
HCT: 39.7 % (ref 36.0–46.0)
Hemoglobin: 13.8 g/dL (ref 12.0–15.0)
MCH: 38.7 pg — ABNORMAL HIGH (ref 26.0–34.0)
MCHC: 34.8 g/dL (ref 30.0–36.0)
MCV: 111.2 fL — ABNORMAL HIGH (ref 80.0–100.0)
Platelets: 183 10*3/uL (ref 150–400)
RBC: 3.57 MIL/uL — ABNORMAL LOW (ref 3.87–5.11)
RDW: 12.7 % (ref 11.5–15.5)
WBC: 5.5 10*3/uL (ref 4.0–10.5)
nRBC: 0 % (ref 0.0–0.2)

## 2023-08-19 LAB — HCG, SERUM, QUALITATIVE: Preg, Serum: NEGATIVE

## 2023-08-19 NOTE — ED Provider Triage Note (Signed)
 Emergency Medicine Provider Triage Evaluation Note  [facility] Sara Simpson , a 38 y.o. female  was evaluated in triage.  Pt complains of intermittent cough and shortness of breath x1 month, seen 2 weeks ago and sent home on prednisone. Had follow up with PCP who prescribed more prednisone, w/ some relief. States she feels her "throat feels constricted." Saying that she coughed this morning and felt her throat "swell up"  making it difficult to breath. Not on birth control. Still taking azithromycin and albuterol inhaler without relief.   Also reports face being swollen after taking prednisone. Last took prednisone 5 days ago.   Denies chest pain, hemoptysis, abdominal pain, LE swelling.   Review of Systems  Positive: N/a Negative: N/a  Physical Exam  BP (!) 149/97   Pulse 82   Temp 98.3 F (36.8 C) (Oral)   Resp 18   Ht 5\' 4"  (1.626 m)   Wt 99.8 kg   SpO2 97%   BMI 37.76 kg/m  Gen:   Awake, no distress   Resp:  Normal effort  MSK:   Moves extremities without difficulty  Other:    Medical Decision Making  Medically screening exam initiated at 1:48 PM.  Appropriate orders placed.  [facility] Anaira Seay was informed that the remainder of the evaluation will be completed by another provider, this initial triage assessment does not replace that evaluation, and the importance of remaining in the ED until their evaluation is complete.     Lunette Stands, [place] 08/19/23 1353

## 2023-08-19 NOTE — ED Notes (Signed)
 Pt c/o increased SOB. VS obtained. O2 sat 97% RA. Triage RN aware

## 2023-08-19 NOTE — ED Provider Notes (Signed)
 [place] EMERGENCY DEPARTMENT AT [facility] Provider Note   CSN: [id number] Arrival date & time: 08/19/23  4098     History  Chief Complaint  Patient presents with   Shortness of Breath    [facility] Muslima Toppins is a 38 y.o. female with past medical history significant for hypertension, anxiety, hepatic steatosis, diabetes, obesity who presents with concern for shortness of breath, coughing attack this morning.  Patient reports that she has been fighting bronchitis intermittently for around 1 month.  Placed on antibiotics recently and still taking them, she just completed a course of steroids around 1 week ago.  She denies any chest pain.  She reports this morning when she was having a coughing fit she felt like she could not breathe at all.  She reports that she continues to feel some shortness of breath.  Shortness of Breath      Home Medications Prior to Admission medications   Medication Sig Start Date End Date Taking? Authorizing Provider  albuterol (VENTOLIN HFA) 108 (90 Base) MCG/ACT inhaler Inhale 2 puffs into the lungs every 4 (four) hours as needed for wheezing or shortness of breath. 07/30/23   Dione Booze, MD  azithromycin [facility]) 250 MG tablet Take 2 tablets on day 1, then 1 tablet daily on days 2 through 5 08/16/23 08/21/23  Christen Butter, NP  blood glucose meter kit and supplies KIT Dispense based on patient and insurance preference. Use up to four times daily as directed. Please include lancets, test strips, control solution. 05/01/21   Christen Butter, NP  Continuous Blood Gluc Sensor (FREESTYLE LIBRE 3 SENSOR) MISC 1 each by Does not apply route every 14 (fourteen) days. 06/20/21   Christen Butter, NP  Continuous Glucose Monitor Sup KIT 1 each by Does not apply route continuous. 06/20/21   Christen Butter, NP  diazepam (VALIUM) 2 MG tablet Take 1 tablet (2 mg total) by mouth every 6 (six) hours as needed (Dizziness). 04/06/23   Lorre Nick, MD  meclizine  (ANTIVERT) 25 MG tablet Take 1 tablet (25 mg total) by mouth 2 (two) times daily as needed for dizziness. 04/09/23   Charlton Amor, DO  metFORMIN (GLUCOPHAGE-XR) 500 MG 24 hr tablet TAKE ONE TABLET BY MOUTH ONE TIME DAILY WITH BREAKFAST 03/04/23   Christen Butter, NP  predniSONE (DELTASONE) 20 MG tablet 2 tabs po daily x 4 days 07/30/23   Dione Booze, MD  promethazine (PHENERGAN) 25 MG tablet Take 1 tablet (25 mg total) by mouth every 8 (eight) hours as needed for nausea or vomiting. 03/20/23   Waldon Merl, PA-C      Allergies    Molds & smuts, Penicillins, and Fluoxetine    Review of Systems   Review of Systems  Respiratory:  Positive for shortness of breath.   All other systems reviewed and are negative.   Physical Exam Updated Vital Signs BP (!) 134/98   Pulse 84   Temp 98.4 F (36.9 C) (Oral)   Resp 18   Ht 5\' 4"  (1.626 m)   Wt 99.8 kg   SpO2 96%   BMI 37.76 kg/m  Physical Exam Vitals and nursing note reviewed.  Constitutional:      General: She is not in acute distress.    Appearance: Normal appearance.  HENT:     Head: Normocephalic and atraumatic.  Eyes:     General:        Right eye: No discharge.  Left eye: No discharge.  Cardiovascular:     Rate and Rhythm: Normal rate and regular rhythm.     Heart sounds: No murmur heard.    No friction rub. No gallop.  Pulmonary:     Effort: Pulmonary effort is normal.     Breath sounds: Normal breath sounds.     Comments: No wheezing, rhonchi, stridor, rales, no focal consolidation.  No coughing during my exam. Abdominal:     General: Bowel sounds are normal.     Palpations: Abdomen is soft.  Skin:    General: Skin is warm and dry.     Capillary Refill: Capillary refill takes less than 2 seconds.  Neurological:     Mental Status: She is alert and oriented to person, place, and time.  Psychiatric:        Mood and Affect: Mood normal.        Behavior: Behavior normal.     ED Results / Procedures / Treatments    Labs (all labs ordered are listed, but only abnormal results are displayed) Labs Reviewed  BASIC METABOLIC PANEL - Abnormal; Notable for the following components:      Result Value   CO2 21 (*)    Glucose, Bld 198 (*)    All other components within normal limits  CBC - Abnormal; Notable for the following components:   RBC 3.57 (*)    MCV 111.2 (*)    MCH 38.7 (*)    All other components within normal limits  HCG, SERUM, QUALITATIVE    EKG None  Radiology DG Chest Port 1 View Result Date: 08/19/2023 CLINICAL DATA:  Surgery, elective. Bronchitis for 1 month. Recent antibiotics and steroids. EXAM: PORTABLE CHEST 1 VIEW COMPARISON:  Radiographs 07/29/2023. FINDINGS: 0922 hours. The heart size and mediastinal contours are normal. The lungs are clear. There is no pleural effusion or pneumothorax. No acute osseous findings are identified. IMPRESSION: No evidence of acute cardiopulmonary process. Electronically Signed   By: Carey Bullocks M.D.   On: 08/19/2023 09:48    Procedures Procedures    Medications Ordered in ED Medications - No data to display  ED Course/ Medical Decision Making/ A&P                                 Medical Decision Making Amount and/or Complexity of Data Reviewed Labs: ordered.   This patient is a 38 y.o. female  who presents to the ED for concern of cough, shortness of breath.   Differential diagnoses prior to evaluation: The emergent differential diagnosis includes, but is not limited to,  asthma exacerbation, COPD exacerbation, acute upper respiratory infection, acute bronchitis, chronic bronchitis, interstitial lung disease, ARDS, PE, pneumonia, atypical ACS, carbon monoxide poisoning, spontaneous pneumothorax, new CHF vs CHF exacerbation, versus other . This is not an exhaustive differential.   Past Medical History / Co-morbidities / Social History: hypertension, anxiety, hepatic steatosis, diabetes, obesity  Additional history: Chart  reviewed. Pertinent results include: reviewed labwork, imaging from previous ED evaluation earlier this month  Physical Exam: Physical exam performed. The pertinent findings include: No wheezing, rhonchi, stridor, rales, no focal consolidation.  No coughing during my exam.  Lab Tests/Imaging studies: I personally interpreted labs/imaging and the pertinent results include:  bmp overall unremarkable, some hyperglycemia, glucose 198, mild bicarb deficit, 21. CBC with mild macrocytosis without anemia, serum pregnancy test negative.. I independently interpreted plain film chest xray, which shows no  evidence of acute intrathoracic abnormality.I agree with the radiologist interpretation.  Cardiac monitoring: EKG obtained and interpreted by myself and attending physician which shows: NSR   Medications: Discussed with patient her symptoms are consistent with ongoing bronchitis, I would not recommend any additional treatment at this time.  She can follow-up with her primary care doctor.  Disposition: After consideration of the diagnostic results and the patients response to treatment, I feel that patient stable for discharge with plan as above.   emergency department workup does not suggest an emergent condition requiring admission or immediate intervention beyond what has been performed at this time. The plan is: as above. The patient is safe for discharge and has been instructed to return immediately for worsening symptoms, change in symptoms or any other concerns.  Final Clinical Impression(s) / ED Diagnoses Final diagnoses:  SOB (shortness of breath)    Rx / DC Orders ED Discharge Orders     None         [place] 08/19/23 [place], MD 08/19/23 2209

## 2023-08-19 NOTE — ED Triage Notes (Addendum)
 Patient has had bronchitis for 1 month. Today woke up feeling like she could not breath after having a coughing attack. Is still taking azithromycin and inhaler. Finished a course of steroids. Complaining of sore throat.

## 2023-08-19 NOTE — Discharge Instructions (Signed)
 Please return if you have worsening shortness of breath, develop chest pain, otherwise I would continue taking the antibiotics that you are already prescribed, use your home inhaler as needed.  Your vitals and workup today in the emergency department were very reassuring, there is no evidence that you are developing a worse infection or your lungs are failing to get the oxygen to your blood cells in the way that they need to.

## 2023-11-04 ENCOUNTER — Other Ambulatory Visit: Payer: Self-pay

## 2023-11-04 MED ORDER — METFORMIN HCL ER 500 MG PO TB24: ORAL_TABLET | ORAL | 0 refills | Status: DC

## 2023-11-11 ENCOUNTER — Telehealth: Payer: Self-pay

## 2023-11-11 NOTE — Telephone Encounter (Signed)
 I called and pharmacy and they state the prescription of the metformin  is ready to be picked up. I tried to call patient. Voicemail is not set up. She will need an appointment for future refills.

## 2023-11-11 NOTE — Telephone Encounter (Signed)
 Copied from CRM [id number]. Topic: Clinical - Prescription Issue >> Nov 08, 2023  4:28 PM Kevelyn M wrote: Reason for CRM: Adriana Hopping with [facility] called in regards to the patient's metFORMIN  (GLUCOPHAGE -XR) 500 MG refill. They sent a refill request on the 12th, the 14th, and the 16th. She wanted to know the status of this request. [phone]

## 2023-11-12 ENCOUNTER — Ambulatory Visit: Payer: Self-pay | Admitting: Medical-Surgical

## 2023-11-12 NOTE — Progress Notes (Deleted)
   Established patient visit  History, exam, impression, and plan:  No problem-specific Assessment & Plan notes found for this encounter.   ROS  Physical Exam  Procedures performed this visit: None.  No follow-ups on file.  __________________________________ Thayer Ohm, DNP, APRN, FNP-BC Primary Care and Sports Medicine [facility]

## 2023-11-14 ENCOUNTER — Ambulatory Visit: Payer: Self-pay | Admitting: Medical-Surgical

## 2023-11-14 ENCOUNTER — Ambulatory Visit (INDEPENDENT_AMBULATORY_CARE_PROVIDER_SITE_OTHER): Payer: Self-pay | Admitting: Medical-Surgical

## 2023-11-14 ENCOUNTER — Encounter: Payer: Self-pay | Admitting: Medical-Surgical

## 2023-11-14 VITALS — BP 117/82 | HR 83 | Resp 20 | Ht 64.0 in | Wt 231.0 lb

## 2023-11-14 DIAGNOSIS — N898 Other specified noninflammatory disorders of vagina: Secondary | ICD-10-CM

## 2023-11-14 DIAGNOSIS — E119 Type 2 diabetes mellitus without complications: Secondary | ICD-10-CM

## 2023-11-14 DIAGNOSIS — R635 Abnormal weight gain: Secondary | ICD-10-CM

## 2023-11-14 DIAGNOSIS — I10 Essential (primary) hypertension: Secondary | ICD-10-CM

## 2023-11-14 DIAGNOSIS — F1010 Alcohol abuse, uncomplicated: Secondary | ICD-10-CM

## 2023-11-14 DIAGNOSIS — F411 Generalized anxiety disorder: Secondary | ICD-10-CM

## 2023-11-14 DIAGNOSIS — Z113 Encounter for screening for infections with a predominantly sexual mode of transmission: Secondary | ICD-10-CM

## 2023-11-14 DIAGNOSIS — Z7984 Long term (current) use of oral hypoglycemic drugs: Secondary | ICD-10-CM

## 2023-11-14 LAB — POCT URINALYSIS DIP (CLINITEK)
Bilirubin, UA: NEGATIVE
Blood, UA: NEGATIVE
Glucose, UA: NEGATIVE mg/dL
Ketones, POC UA: NEGATIVE mg/dL
Leukocytes, UA: NEGATIVE
Nitrite, UA: NEGATIVE
POC PROTEIN,UA: NEGATIVE
Spec Grav, UA: 1.015 (ref 1.010–1.025)
Urobilinogen, UA: 4 U/dL — AB
pH, UA: 7.5 (ref 5.0–8.0)

## 2023-11-14 LAB — POCT GLYCOSYLATED HEMOGLOBIN (HGB A1C)
HbA1c, POC (controlled diabetic range): 5.6 % (ref 0.0–7.0)
Hemoglobin A1C: 5.6 % (ref 4.0–5.6)

## 2023-11-14 LAB — POCT UA - MICROALBUMIN
Albumin/Creatinine Ratio, Urine, POC: <30
Creatinine, POC: 100 mg/dL
Microalbumin Ur, POC: 30 mg/L

## 2023-11-14 NOTE — Progress Notes (Signed)
        Established patient visit  History, exam, impression, and plan:  1. Controlled type 2 diabetes mellitus without complication, without long-term current use of insulin (HCC) (Primary) Pleasant 38 year old female presenting today with a history of controlled diabetes currently managed with metformin  XR 500mg  daily. Tolerating the medication well without side effects. Previous A1c at 5.6%, recheck today the same. POCT microalbumin normal. Foot exam passed with a little numbness/tingling at the left foot great toe. Due for a diabetic eye exam. Diabetes remains well controlled. Continue Metformin  XR 500mg  daily as prescribed.  - POCT HgB A1C - POCT UA - Microalbumin - HM Diabetes Foot Exam  2. Routine screening for STI (sexually transmitted infection) Requesting full STI testing today. Orders as below. No known exposures.  - STI Profile, CT/NG/TV - POCT URINALYSIS DIP (CLINITEK) - Urine Culture  3. Alcohol abuse Admits to alcohol abuse on a daily basis. Is strongly considering entering a rehab for detox and treatment of associated mental health disorders. Checking ethanol levels. Discussed options in the area for rehab including [facility], [facility], [facility], [facility], and behavioral health urgent care.  - Ethanol  4. Hypertension goal BP (blood pressure) < 130/80 History of hypertension not currently treated with medications. Following a low sodium diet. Denies CP, SOB, palpitations, lower extremity edema, dizziness, headaches, or vision changes. Cardiopulmonary exam normal. Checking labs. BP at goal. Continue diet/lifestyle modifications.  - CBC with Differential/Platelet - CMP14+EGFR - Lipid panel  5. GAD (generalized anxiety disorder) As noted above, struggles with anxiety and has been self-medicating with alcohol. Discussed the option of starting medication to help manage her anxiety but patient reports that she wants to hold of because she would hate to get her mental  health better but still be stuck on alcohol. See above regarding rehab.   6. Weight gain Was able to lose some weight however it seems that she has gained it all back even though her habits have not changed. Checking TSH. Likely related to alcohol intake plus diet and exercise habits.  - TSH  7. Vaginal discharge Reports some white vaginal discharge but no significant odor. Requesting STI testing noted above. Adding wet prep for eval for BV and yeast.  - WET PREP FOR TRICH, YEAST, CLUE - POCT URINALYSIS DIP (CLINITEK) - Urine Culture    Procedures performed this visit: None.  Return in about 6 months (around 05/16/2024) for DM follow up.  __________________________________ Maryl Snook, DNP, APRN, FNP-BC Primary Care and Sports Medicine [facility] [place]

## 2023-11-15 ENCOUNTER — Encounter: Payer: Self-pay | Admitting: Medical-Surgical

## 2023-11-15 DIAGNOSIS — R42 Dizziness and giddiness: Secondary | ICD-10-CM

## 2023-11-15 LAB — WET PREP FOR TRICH, YEAST, CLUE
Clue Cell Exam: POSITIVE — AB
Trichomonas Exam: NEGATIVE
Yeast Exam: NEGATIVE

## 2023-11-16 ENCOUNTER — Ambulatory Visit: Payer: Self-pay | Admitting: Medical-Surgical

## 2023-11-16 DIAGNOSIS — Z113 Encounter for screening for infections with a predominantly sexual mode of transmission: Secondary | ICD-10-CM

## 2023-11-16 LAB — URINE CULTURE

## 2023-11-16 MED ORDER — METFORMIN HCL ER 500 MG PO TB24: 500.0000 mg | ORAL_TABLET | Freq: Every day | ORAL | 3 refills | Status: DC

## 2023-11-18 LAB — CBC WITH DIFFERENTIAL/PLATELET
Basophils Absolute: 0.1 10*3/uL (ref 0.0–0.2)
Basos: 1 %
EOS (ABSOLUTE): 0.2 10*3/uL (ref 0.0–0.4)
Eos: 3 %
Hematocrit: 42.1 % (ref 34.0–46.6)
Hemoglobin: 15 g/dL (ref 11.1–15.9)
Immature Grans (Abs): 0 10*3/uL (ref 0.0–0.1)
Immature Granulocytes: 0 %
Lymphocytes Absolute: 3 10*3/uL (ref 0.7–3.1)
Lymphs: 41 %
MCH: 39.2 pg — ABNORMAL HIGH (ref 26.6–33.0)
MCHC: 35.6 g/dL (ref 31.5–35.7)
MCV: 110 fL — ABNORMAL HIGH (ref 79–97)
Monocytes Absolute: 0.5 10*3/uL (ref 0.1–0.9)
Monocytes: 7 %
Neutrophils Absolute: 3.5 10*3/uL (ref 1.4–7.0)
Neutrophils: 48 %
Platelets: 204 10*3/uL (ref 150–450)
RBC: 3.83 x10E6/uL (ref 3.77–5.28)
RDW: 12.6 % (ref 11.7–15.4)
WBC: 7.4 10*3/uL (ref 3.4–10.8)

## 2023-11-18 LAB — CMP14+EGFR
ALT: 70 IU/L — ABNORMAL HIGH (ref 0–32)
AST: 86 IU/L — ABNORMAL HIGH (ref 0–40)
Albumin: 4.2 g/dL (ref 3.9–4.9)
Alkaline Phosphatase: 88 IU/L (ref 44–121)
BUN/Creatinine Ratio: 21 (ref 9–23)
BUN: 10 mg/dL (ref 6–20)
Bilirubin Total: 1.4 mg/dL — ABNORMAL HIGH (ref 0.0–1.2)
CO2: 24 mmol/L (ref 20–29)
Calcium: 9.7 mg/dL (ref 8.7–10.2)
Chloride: 99 mmol/L (ref 96–106)
Creatinine, Ser: 0.48 mg/dL — ABNORMAL LOW (ref 0.57–1.00)
Globulin, Total: 4 g/dL (ref 1.5–4.5)
Glucose: 140 mg/dL — ABNORMAL HIGH (ref 70–99)
Potassium: 4.6 mmol/L (ref 3.5–5.2)
Sodium: 137 mmol/L (ref 134–144)
Total Protein: 8.2 g/dL (ref 6.0–8.5)
eGFR: 123 mL/min/{1.73_m2} (ref >59)

## 2023-11-18 LAB — STI PROFILE, CT/NG/TV
HCV Ab: NONREACTIVE
HIV Screen 4th Generation wRfx: NONREACTIVE
Hep B Core Total Ab: NEGATIVE
Hep B Surface Ab, Qual: NONREACTIVE
Hepatitis B Surface Ag: NEGATIVE
RPR Ser Ql: NONREACTIVE

## 2023-11-18 LAB — ETHANOL

## 2023-11-18 LAB — LIPID PANEL
Chol/HDL Ratio: 2.4 ratio (ref 0.0–4.4)
Cholesterol, Total: 246 mg/dL — ABNORMAL HIGH (ref 100–199)
HDL: 102 mg/dL (ref >39)
LDL Chol Calc (NIH): 129 mg/dL — ABNORMAL HIGH (ref 0–99)
Triglycerides: 91 mg/dL (ref 0–149)
VLDL Cholesterol Cal: 15 mg/dL (ref 5–40)

## 2023-11-18 LAB — HCV INTERPRETATION

## 2023-11-18 LAB — TSH: TSH: 2.71 u[IU]/mL (ref 0.450–4.500)

## 2023-11-19 MED ORDER — MECLIZINE HCL 25 MG PO TABS
25.0000 mg | ORAL_TABLET | Freq: Two times a day (BID) | ORAL | 1 refills | Status: DC | PRN
Start: 2023-11-19 — End: 2023-12-08

## 2023-11-19 MED ORDER — DIAZEPAM 2 MG PO TABS: 2.0000 mg | ORAL_TABLET | Freq: Four times a day (QID) | ORAL | 0 refills | Status: DC | PRN

## 2023-11-19 MED ORDER — ROSUVASTATIN CALCIUM 5 MG PO TABS: 5.0000 mg | ORAL_TABLET | Freq: Every day | ORAL | 3 refills | Status: AC

## 2023-11-19 MED ORDER — METRONIDAZOLE 500 MG PO TABS: 500.0000 mg | ORAL_TABLET | Freq: Two times a day (BID) | ORAL | 0 refills | Status: AC

## 2023-12-02 ENCOUNTER — Other Ambulatory Visit (HOSPITAL_COMMUNITY)
Admission: EM | Admit: 2023-12-02 | Discharge: 2023-12-09 | Disposition: A | Discharge status: Home / Self Care | Attending: Psychiatry | Admitting: Psychiatry

## 2023-12-02 ENCOUNTER — Ambulatory Visit (HOSPITAL_COMMUNITY): Admission: EM | Admit: 2023-12-02 | Discharge: 2023-12-02 | Disposition: A | Discharge status: Skilled Nursing Facility

## 2023-12-02 DIAGNOSIS — F419 Anxiety disorder, unspecified: Secondary | ICD-10-CM | POA: Diagnosis not present

## 2023-12-02 DIAGNOSIS — F1020 Alcohol dependence, uncomplicated: Secondary | ICD-10-CM | POA: Diagnosis not present

## 2023-12-02 DIAGNOSIS — F1994 Other psychoactive substance use, unspecified with psychoactive substance-induced mood disorder: Secondary | ICD-10-CM | POA: Diagnosis not present

## 2023-12-02 DIAGNOSIS — E119 Type 2 diabetes mellitus without complications: Secondary | ICD-10-CM

## 2023-12-02 DIAGNOSIS — F339 Major depressive disorder, recurrent, unspecified: Secondary | ICD-10-CM | POA: Insufficient documentation

## 2023-12-02 DIAGNOSIS — F1010 Alcohol abuse, uncomplicated: Secondary | ICD-10-CM | POA: Diagnosis present

## 2023-12-02 LAB — TSH: TSH: 3.227 u[IU]/mL (ref 0.350–4.500)

## 2023-12-02 LAB — CBC WITH DIFFERENTIAL/PLATELET
Abs Immature Granulocytes: 0.02 10*3/uL (ref 0.00–0.07)
Basophils Absolute: 0.1 10*3/uL (ref 0.0–0.1)
Basophils Relative: 1 %
Eosinophils Absolute: 0.1 10*3/uL (ref 0.0–0.5)
Eosinophils Relative: 1 %
HCT: 40.2 % (ref 36.0–46.0)
Hemoglobin: 14.6 g/dL (ref 12.0–15.0)
Immature Granulocytes: 0 %
Lymphocytes Relative: 38 %
Lymphs Abs: 3.5 10*3/uL (ref 0.7–4.0)
MCH: 39 pg — ABNORMAL HIGH (ref 26.0–34.0)
MCHC: 36.3 g/dL — ABNORMAL HIGH (ref 30.0–36.0)
MCV: 107.5 fL — ABNORMAL HIGH (ref 80.0–100.0)
Monocytes Absolute: 0.6 10*3/uL (ref 0.1–1.0)
Monocytes Relative: 6 %
Neutro Abs: 4.8 10*3/uL (ref 1.7–7.7)
Neutrophils Relative %: 54 %
Platelets: 212 10*3/uL (ref 150–400)
RBC: 3.74 MIL/uL — ABNORMAL LOW (ref 3.87–5.11)
RDW: 11.9 % (ref 11.5–15.5)
WBC: 9.1 10*3/uL (ref 4.0–10.5)
nRBC: 0 % (ref 0.0–0.2)

## 2023-12-02 LAB — POC URINE PREG, ED: Preg Test, Ur: NEGATIVE

## 2023-12-02 LAB — POCT URINE DRUG SCREEN - MANUAL ENTRY (I-SCREEN)
POC Amphetamine UR: NOT DETECTED
POC Buprenorphine (BUP): NOT DETECTED
POC Cocaine UR: NOT DETECTED
POC Marijuana UR: NOT DETECTED
POC Methadone UR: NOT DETECTED
POC Methamphetamine UR: NOT DETECTED
POC Morphine: NOT DETECTED
POC Oxazepam (BZO): NOT DETECTED
POC Oxycodone UR: NOT DETECTED
POC Secobarbital (BAR): NOT DETECTED

## 2023-12-02 LAB — ETHANOL: Alcohol, Ethyl (B): 21 mg/dL — ABNORMAL HIGH (ref <15)

## 2023-12-02 MED ORDER — LORAZEPAM 1 MG PO TABS: 1.0000 mg | ORAL_TABLET | Freq: Four times a day (QID) | ORAL | Status: DC | PRN

## 2023-12-02 MED ORDER — LORAZEPAM 1 MG PO TABS
1.0000 mg | ORAL_TABLET | Freq: Three times a day (TID) | ORAL | Status: DC
  Filled 2023-12-02: qty 1

## 2023-12-02 MED ORDER — LORAZEPAM 1 MG PO TABS: 1.0000 mg | ORAL_TABLET | Freq: Two times a day (BID) | ORAL | Status: DC

## 2023-12-02 MED ORDER — ONDANSETRON 4 MG PO TBDP: 4.0000 mg | ORAL_TABLET | Freq: Four times a day (QID) | ORAL | Status: AC | PRN

## 2023-12-02 MED ORDER — HYDROXYZINE HCL 25 MG PO TABS
25.0000 mg | ORAL_TABLET | Freq: Four times a day (QID) | ORAL | Status: AC | PRN
  Administered 2023-12-02 – 2023-12-05 (×5): 25 mg via ORAL
  Filled 2023-12-02 (×4): qty 1

## 2023-12-02 MED ORDER — THIAMINE MONONITRATE 100 MG PO TABS
100.0000 mg | ORAL_TABLET | Freq: Every day | ORAL | Status: DC
  Administered 2023-12-03 – 2023-12-08 (×6): 100 mg via ORAL
  Filled 2023-12-02 (×2): qty 1
  Filled 2023-12-02: qty 7
  Filled 2023-12-02 (×5): qty 1

## 2023-12-02 MED ORDER — MAGNESIUM HYDROXIDE 400 MG/5ML PO SUSP: 30.0000 mL | Freq: Every day | ORAL | Status: DC | PRN

## 2023-12-02 MED ORDER — OLANZAPINE 10 MG IM SOLR: 5.0000 mg | Freq: Three times a day (TID) | INTRAMUSCULAR | Status: DC | PRN

## 2023-12-02 MED ORDER — ACETAMINOPHEN 325 MG PO TABS
650.0000 mg | ORAL_TABLET | Freq: Four times a day (QID) | ORAL | Status: DC | PRN
  Administered 2023-12-03: 650 mg via ORAL
  Filled 2023-12-02: qty 2

## 2023-12-02 MED ORDER — ADULT MULTIVITAMIN W/MINERALS CH
1.0000 | ORAL_TABLET | Freq: Every day | ORAL | Status: DC
  Administered 2023-12-02 – 2023-12-09 (×8): 1 via ORAL
  Filled 2023-12-02 (×9): qty 1

## 2023-12-02 MED ORDER — ALUM & MAG HYDROXIDE-SIMETH 200-200-20 MG/5ML PO SUSP: 30.0000 mL | ORAL | Status: DC | PRN

## 2023-12-02 MED ORDER — METFORMIN HCL ER 500 MG PO TB24
500.0000 mg | ORAL_TABLET | Freq: Every day | ORAL | Status: DC
  Administered 2023-12-03 – 2023-12-09 (×7): 500 mg via ORAL
  Filled 2023-12-02 (×2): qty 1
  Filled 2023-12-02: qty 7
  Filled 2023-12-02 (×5): qty 1

## 2023-12-02 MED ORDER — LORAZEPAM 1 MG PO TABS
1.0000 mg | ORAL_TABLET | Freq: Four times a day (QID) | ORAL | Status: AC
  Filled 2023-12-02: qty 1

## 2023-12-02 MED ORDER — OLANZAPINE 5 MG PO TBDP: 5.0000 mg | ORAL_TABLET | Freq: Three times a day (TID) | ORAL | Status: DC | PRN

## 2023-12-02 MED ORDER — LORAZEPAM 1 MG PO TABS: 1.0000 mg | ORAL_TABLET | Freq: Every day | ORAL | Status: DC

## 2023-12-02 MED ORDER — OLANZAPINE 10 MG IM SOLR: 10.0000 mg | Freq: Three times a day (TID) | INTRAMUSCULAR | Status: DC | PRN

## 2023-12-02 MED ORDER — LOPERAMIDE HCL 2 MG PO CAPS: 2.0000 mg | ORAL_CAPSULE | ORAL | Status: AC | PRN

## 2023-12-02 MED ORDER — THIAMINE HCL 100 MG/ML IJ SOLN
100.0000 mg | Freq: Once | INTRAMUSCULAR | Status: AC
  Administered 2023-12-02: 100 mg via INTRAMUSCULAR
  Filled 2023-12-02: qty 2
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
## 2023-12-02 NOTE — ED Notes (Addendum)
 Pt admitted to [facility] for alcohol problem per chief complaint. Ambulated to unit from the assessment area w/this nurse. Pt denies SI/HI/AVH. Pt is pleasant and cooperative. Pt goal for today is to work on her alcohol intake. Pt was offered support and encouragement. Pt was given scheduled medications. Pt was encourage to attend groups, oriented to unit and the units Q15 minute checks.   Pt refused ativan, education provided, but pt stated "I this ativan will be too much for me and I prefer something lighter such as atarax  which I was prescribed in the past as needed."  Pt receptive to treatment minus the ativan scheduled per [facility]). safety maintained on unit.

## 2023-12-02 NOTE — ED Provider Notes (Signed)
 Facility Based Crisis Admission H&P  Date: 12/03/23 Patient Name: Sara Simpson Braley Luckenbaugh MRN: [id number] Chief Complaint: needing help with alcohol   Diagnoses:  Final diagnoses:  Alcohol abuse  Recurrent major depressive disorder, remission status unspecified (HCC)    HPI: Birda Buffy, 38 y/o female with a history of GAD, MDD and alcohol abuse, presented to [facility] Voluntarily,  seeking detox.  Per the patient she has never done detox before, according to her she drinks at least 10 shots a day until she passed out and she do it all over again.  According to the patient she is currently unemployed, lives with her daughter.  Patient reports she drank last before coming in.  Per the patient she is currently not taking any prescribed psychiatric medication.  Face-to-face evaluation of patient, patient is alert and oriented x 4, speech is clear, maintain eye contact.  Patient does appear a little bit nervous however patient does not appear to be in any immediate distress.  Does not show any sign of withdrawal at this present moment.  Patient denies SI, HI, AVH or paranoia.  Denies illicit drug use.  Denies smoking.  Reports she does have access to guns but denied wanting to hurt herself or others.  Patient does not appear to be influenced by internal or external stimuli at this present moment.  Writer discussed with patient the treatment plan also gave her the opportunity to ask questions patient questions were answered.  PHQ-9 completed patient scored an 11  Recommend inpatient [facility]  PHQ 2-9:  Flowsheet Row ED from 12/02/2023 in [facility] Office Visit from 11/14/2023 in [facility] Primary [facility] & [facility] at [facility] Office Visit from 06/06/2022 in [facility] Primary [facility] at [facility]  Thoughts that you would be better off dead, or of hurting yourself in some way Not at all Not at all Not at all  PHQ-9 Total  Score 11 15 16        Flowsheet Row ED from 12/02/2023 in [facility] Most recent reading at 12/02/2023  9:44 PM ED from 12/02/2023 in [facility] Most recent reading at 12/02/2023  6:00 PM ED from 08/19/2023 in [facility] Emergency Department at [facility] Most recent reading at 08/19/2023  8:39 AM  C-SSRS RISK CATEGORY No Risk No Risk No Risk       Screenings    Flowsheet Row Most Recent Value  CIWA-Ar Total 7       Total Time spent with patient: 20 minutes  Musculoskeletal  Strength & Muscle Tone: within normal limits Gait & Station: normal Patient leans: N/A  Psychiatric Specialty Exam  Presentation General Appearance:  Casual  Eye Contact: Good  Speech: Clear and Coherent  Speech Volume: Normal  Handedness: Right   Mood and Affect  Mood: Anxious  Affect: Congruent   Thought Process  Thought Processes: Coherent  Descriptions of Associations:Intact  Orientation:Full (Time, Place and Person)  Thought Content:Logical    Hallucinations:Hallucinations: None  Ideas of Reference:None  Suicidal Thoughts:Suicidal Thoughts: No  Homicidal Thoughts:Homicidal Thoughts: No   Sensorium  Memory: Immediate Good  Judgment: Fair  Insight: Fair   Art therapist  Concentration: Fair  Attention Span: Fair  Recall: Good  Fund of Knowledge: Good  Language: Good   Psychomotor Activity  Psychomotor Activity: Psychomotor Activity: Normal   Assets  Assets: Desire for Improvement; Resilience   Sleep  Sleep: Sleep: Fair  Nutritional Assessment (For OBS and FBC admissions only) Has the patient had a weight loss or gain of 10 pounds or more in the last 3 months?: No Has the patient had a decrease in food intake/or appetite?: No Does the patient have dental problems?: No Does the patient have eating habits or behaviors that may be indicators of an eating disorder  including binging or inducing vomiting?: No Has the patient recently lost weight without trying?: 0 Has the patient been eating poorly because of a decreased appetite?: 0 Malnutrition Screening Tool Score: 0    Physical Exam HENT:     Head: Normocephalic.     Nose: Nose normal.  Eyes:     Pupils: Pupils are equal, round, and reactive to light.  Cardiovascular:     Rate and Rhythm: Normal rate.  Pulmonary:     Effort: Pulmonary effort is normal.  Musculoskeletal:        General: Normal range of motion.     Cervical back: Normal range of motion.  Neurological:     General: No focal deficit present.     Mental Status: She is alert.  Psychiatric:        Mood and Affect: Mood normal.        Behavior: Behavior normal.        Thought Content: Thought content normal.        Judgment: Judgment normal.    Review of Systems  Constitutional: Negative.   HENT: Negative.    Eyes: Negative.   Respiratory: Negative.    Cardiovascular: Negative.   Gastrointestinal: Negative.   Genitourinary: Negative.   Musculoskeletal: Negative.   Skin: Negative.   Neurological: Negative.   Psychiatric/Behavioral:  Positive for depression and substance abuse. The patient is nervous/anxious.     Blood pressure (!) 143/97, pulse 79, temperature 99.4 F (37.4 C), temperature source Oral, resp. rate 20, SpO2 97%. There is no height or weight on file to calculate BMI.  Past Psychiatric History: MDD, GAD, alcohol abuse  Is the patient at risk to self? No  Has the patient been a risk to self in the past 6 months? No .    Has the patient been a risk to self within the distant past? No   Is the patient a risk to others? No   Has the patient been a risk to others in the past 6 months? No   Has the patient been a risk to others within the distant past? No   Past Medical History: See chart Family History: Unknown Social History: Alcohol use  Last Labs:  Admission on 12/02/2023  Component Date Value  Ref Range Status   WBC 12/02/2023 9.1  4.0 - 10.5 K/uL Final   RBC 12/02/2023 3.74 (L)  3.87 - 5.11 MIL/uL Final   Hemoglobin 12/02/2023 14.6  12.0 - 15.0 g/dL Final   HCT 16/03/9603 40.2  36.0 - 46.0 % Final   MCV 12/02/2023 107.5 (H)  80.0 - 100.0 fL Final   MCH 12/02/2023 39.0 (H)  26.0 - 34.0 pg Final   MCHC 12/02/2023 36.3 (H)  30.0 - 36.0 g/dL Final   RDW 54/02/8118 11.9  11.5 - 15.5 % Final   Platelets 12/02/2023 212  150 - 400 K/uL Final   nRBC 12/02/2023 0.0  0.0 - 0.2 % Final   Neutrophils Relative % 12/02/2023 54  % Final   Neutro Abs 12/02/2023 4.8  1.7 - 7.7 K/uL Final   Lymphocytes Relative 12/02/2023 38  % Final  Lymphs Abs 12/02/2023 3.5  0.7 - 4.0 K/uL Final   Monocytes Relative 12/02/2023 6  % Final   Monocytes Absolute 12/02/2023 0.6  0.1 - 1.0 K/uL Final   Eosinophils Relative 12/02/2023 1  % Final   Eosinophils Absolute 12/02/2023 0.1  0.0 - 0.5 K/uL Final   Basophils Relative 12/02/2023 1  % Final   Basophils Absolute 12/02/2023 0.1  0.0 - 0.1 K/uL Final   Immature Granulocytes 12/02/2023 0  % Final   Abs Immature Granulocytes 12/02/2023 0.02  0.00 - 0.07 K/uL Final   Performed at [facility], [address] [place], [place] [address]   Alcohol, Ethyl (B) 12/02/2023 21 (H)  <15 mg/dL Final   Comment: (NOTE) For medical purposes only. Performed at [facility], [address], [place], [place] [address]    TSH 12/02/2023 3.227  0.350 - 4.500 uIU/mL Final   Comment: Performed by a 3rd Generation assay with a functional sensitivity of <=0.01 uIU/mL. Performed at [facility], [address] [place], [place] [address]    Preg Test, Ur 12/02/2023 Negative  Negative Final   POC Amphetamine UR 12/02/2023 None Detected  NONE DETECTED (Cut Off Level 1000 ng/mL) Final   POC Secobarbital (BAR) 12/02/2023 None Detected  NONE DETECTED (Cut Off Level 300 ng/mL) Final   POC Buprenorphine (BUP) 12/02/2023 None Detected  NONE DETECTED (Cut Off Level 10  ng/mL) Final   POC Oxazepam (BZO) 12/02/2023 None Detected  NONE DETECTED (Cut Off Level 300 ng/mL) Final   POC Cocaine UR 12/02/2023 None Detected  NONE DETECTED (Cut Off Level 300 ng/mL) Final   POC Methamphetamine UR 12/02/2023 None Detected  NONE DETECTED (Cut Off Level 1000 ng/mL) Final   POC Morphine 12/02/2023 None Detected  NONE DETECTED (Cut Off Level 300 ng/mL) Final   POC Methadone UR 12/02/2023 None Detected  NONE DETECTED (Cut Off Level 300 ng/mL) Final   POC Oxycodone UR 12/02/2023 None Detected  NONE DETECTED (Cut Off Level 100 ng/mL) Final   POC Marijuana UR 12/02/2023 None Detected  NONE DETECTED (Cut Off Level 50 ng/mL) Final  Office Visit on 11/14/2023  Component Date Value Ref Range Status   Hemoglobin A1C 11/14/2023 5.6  4.0 - 5.6 % Final   HbA1c, POC (controlled diabetic ra* 11/14/2023 5.6  0.0 - 7.0 % Final   Microalbumin Ur, POC 11/14/2023 30  mg/L Final   Creatinine, POC 11/14/2023 100  mg/dL Final   Albumin/Creatinine Ratio, Urine, P* 11/14/2023 <30   Final   Hepatitis B Surface Ag 11/14/2023 Negative  Negative Final   Hep B Surface Ab, Qual 11/14/2023 Non Reactive   Final   Comment:              Non Reactive: Not immune to HBV infection.              Equivocal: Unable to determine if anti-HBs                         is present at levels consistent                         with immunity.               Reactive: Anti-HBs concentration detected                         at greater than  10 mIU/mL.                         Individual is considered to be                         immune to infection with HBV.    Hep B Core Total Ab 11/14/2023 Negative  Negative Final   Rfx to HBc IgM 11/14/2023 Comment   Final   Reflex criteria was not met.   Interpretation 11/14/2023 Comment   Final   Comment:                    HBV Serology Interpretation Chart  -------------------------------------------------------------------  Interpretation             HBsAg  anti-HBs  anti-HBc   anti-HBc IgM  -------------------------------------------------------------------  Key - Analyte present: + Analyte absent: - Test not indicated: TNI  -------------------------------------------------------------------  Susceptible (never  infected and no evidence    -        -         -          TNI  of vaccination)  -------------------------------------------------------------------  Immune due to natural  resolved infection          -        +         +          TNI  -------------------------------------------------------------------  Immune due to vaccination   -        +         -          TNI  -------------------------------------------------------------------  Acute Infection             +        -         +           +  -------------------------------------------------------------------                            Chronic infection           +        -         +           -  -------------------------------------------------------------------  Interpretation unclear*     -        -         +          +/-  -------------------------------------------------------------------  *Multiple possibilities: resolved infection (most common); false-   positive anti-HBc (susceptible); low-level chronic infection;   resolving acute infection.    HCV Ab 11/14/2023 Non Reactive  Non Reactive Final   RPR Ser Ql 11/14/2023 Non Reactive  Non Reactive Final   HIV Screen 4th Generation wRfx 11/14/2023 Non Reactive  Non Reactive Final   Comment: HIV-1/HIV-2 antibodies and HIV-1 p24 antigen were NOT detected. There is no laboratory evidence of HIV infection. HIV Negative    Chlamydia by NAA 11/14/2023 Negative  Negative Final   Gonococcus by NAA 11/14/2023 Negative  Negative Final   Trich vag by NAA 11/14/2023 Negative  Negative Final   WBC 11/14/2023 7.4  3.4 - 10.8 x10E3/uL Final   RBC 11/14/2023 3.83  3.77 - 5.28 x10E6/uL Final   Hemoglobin 11/14/2023 15.0  11.1 - 15.9 g/dL Final    Hematocrit 16/03/9603 42.1  34.0 - 46.6 % Final   MCV 11/14/2023 110 (H)  79 - 97 fL Final   MCH 11/14/2023 39.2 (H)  26.6 - 33.0 pg Final   MCHC 11/14/2023 35.6  31.5 - 35.7 g/dL Final   RDW 30/86/5784 12.6  11.7 - 15.4 % Final   Platelets 11/14/2023 204  150 - 450 x10E3/uL Final   Neutrophils 11/14/2023 48  Not Estab. % Final   Lymphs 11/14/2023 41  Not Estab. % Final   Monocytes 11/14/2023 7  Not Estab. % Final   Eos 11/14/2023 3  Not Estab. % Final   Basos 11/14/2023 1  Not Estab. % Final   Neutrophils Absolute 11/14/2023 3.5  1.4 - 7.0 x10E3/uL Final   Lymphocytes Absolute 11/14/2023 3.0  0.7 - 3.1 x10E3/uL Final   Monocytes Absolute 11/14/2023 0.5  0.1 - 0.9 x10E3/uL Final   EOS (ABSOLUTE) 11/14/2023 0.2  0.0 - 0.4 x10E3/uL Final   Basophils Absolute 11/14/2023 0.1  0.0 - 0.2 x10E3/uL Final   Immature Granulocytes 11/14/2023 0  Not Estab. % Final   Immature Grans (Abs) 11/14/2023 0.0  0.0 - 0.1 x10E3/uL Final   Glucose 11/14/2023 140 (H)  70 - 99 mg/dL Final   BUN 69/62/9528 10  6 - 20 mg/dL Final   Creatinine, Ser 11/14/2023 0.48 (L)  0.57 - 1.00 mg/dL Final   eGFR 41/32/4401 123  >59 mL/min/1.73 Final   BUN/Creatinine Ratio 11/14/2023 21  9 - 23 Final   Sodium 11/14/2023 137  134 - 144 mmol/L Final   Potassium 11/14/2023 4.6  3.5 - 5.2 mmol/L Final   Chloride 11/14/2023 99  96 - 106 mmol/L Final   CO2 11/14/2023 24  20 - 29 mmol/L Final   Calcium  11/14/2023 9.7  8.7 - 10.2 mg/dL Final   Total Protein 02/72/5366 8.2  6.0 - 8.5 g/dL Final   Albumin 44/08/4740 4.2  3.9 - 4.9 g/dL Final   Globulin, Total 11/14/2023 4.0  1.5 - 4.5 g/dL Final   Bilirubin Total 11/14/2023 1.4 (H)  0.0 - 1.2 mg/dL Final   Alkaline Phosphatase 11/14/2023 88  44 - 121 IU/L Final   AST 11/14/2023 86 (H)  0 - 40 IU/L Final   ALT 11/14/2023 70 (H)  0 - 32 IU/L Final   Cholesterol, Total 11/14/2023 246 (H)  100 - 199 mg/dL Final   Triglycerides 59/56/3875 91  0 - 149 mg/dL Final   HDL 64/33/2951 102   >39 mg/dL Final   VLDL Cholesterol Cal 11/14/2023 15  5 - 40 mg/dL Final   LDL Chol Calc (NIH) 11/14/2023 129 (H)  0 - 99 mg/dL Final   Chol/HDL Ratio 11/14/2023 2.4  0.0 - 4.4 ratio Final   Comment:                                   T. Chol/HDL Ratio                                             Men  Women                               1/2 Avg.Risk  3.4    3.3  Avg.Risk  5.0    4.4                                2X Avg.Risk  9.6    7.1                                3X Avg.Risk 23.4   11.0    Ethanol 11/14/2023 <.010  Cutoff=0.010 % Final   TSH 11/14/2023 2.710  0.450 - 4.500 uIU/mL Final   Trichomonas Exam 11/14/2023 Negative  Negative Final   Yeast Exam 11/14/2023 Negative  Negative Final   Clue Cell Exam 11/14/2023 Positive (A)  Negative Final   Color, UA 11/14/2023 yellow  yellow Final   Clarity, UA 11/14/2023 clear  clear Final   Glucose, UA 11/14/2023 negative  negative mg/dL Final   Bilirubin, UA 16/03/9603 negative  negative Final   Ketones, POC UA 11/14/2023 negative  negative mg/dL Final   Spec Grav, UA 54/02/8118 1.015  1.010 - 1.025 Final   Blood, UA 11/14/2023 negative  negative Final   pH, UA 11/14/2023 7.5  5.0 - 8.0 Final   POC PROTEIN,UA 11/14/2023 negative  negative, trace Final   Urobilinogen, UA 11/14/2023 4.0 (A)  0.2 or 1.0 E.U./dL Final   Nitrite, UA 14/78/2956 Negative  Negative Final   Leukocytes, UA 11/14/2023 Negative  Negative Final   Urine Culture, Routine 11/14/2023 Final report   Final   Organism ID, Bacteria 11/14/2023 Comment   Final   Comment: Mixed urogenital flora Less than 10,000 colonies/mL    HCV Interp 1: 11/14/2023 Comment   Final   Comment: Not infected with HCV unless early or acute infection is suspected (which may be delayed in an immunocompromised individual), or other evidence exists to indicate HCV infection.   Admission on 08/19/2023, Discharged on 08/19/2023  Component Date Value Ref Range Status    Sodium 08/19/2023 137  135 - 145 mmol/L Final   Potassium 08/19/2023 3.5  3.5 - 5.1 mmol/L Final   Chloride 08/19/2023 105  98 - 111 mmol/L Final   CO2 08/19/2023 21 (L)  22 - 32 mmol/L Final   Glucose, Bld 08/19/2023 198 (H)  70 - 99 mg/dL Final   Glucose reference range applies only to samples taken after fasting for at least 8 hours.   BUN 08/19/2023 13  6 - 20 mg/dL Final   Creatinine, Ser 08/19/2023 0.44  0.44 - 1.00 mg/dL Final   Calcium  08/19/2023 9.0  8.9 - 10.3 mg/dL Final   GFR, Estimated 08/19/2023 >60  >60 mL/min Final   Comment: (NOTE) Calculated using the CKD-EPI Creatinine Equation (2021)    Anion gap 08/19/2023 11  5 - 15 Final   Performed at [facility], [address] [place], [place] [address]   WBC 08/19/2023 5.5  4.0 - 10.5 K/uL Final   RBC 08/19/2023 3.57 (L)  3.87 - 5.11 MIL/uL Final   Hemoglobin 08/19/2023 13.8  12.0 - 15.0 g/dL Final   HCT 65/78/4696 39.7  36.0 - 46.0 % Final   MCV 08/19/2023 111.2 (H)  80.0 - 100.0 fL Final   MCH 08/19/2023 38.7 (H)  26.0 - 34.0 pg Final   MCHC 08/19/2023 34.8  30.0 - 36.0 g/dL Final   RDW 29/52/8413 12.7  11.5 - 15.5 % Final   Platelets 08/19/2023 183  150 - 400 K/uL  Final   nRBC 08/19/2023 0.0  0.0 - 0.2 % Final   Performed at [facility], [address], [place], [place] [address]   Preg, Serum 08/19/2023 NEGATIVE  NEGATIVE Final   Comment:        THE SENSITIVITY OF THIS METHODOLOGY IS >10 mIU/mL. Performed at [facility], [address] [place], [place] [address]   Admission on 07/29/2023, Discharged on 07/30/2023  Component Date Value Ref Range Status   WBC 07/29/2023 5.2  4.0 - 10.5 K/uL Final   RBC 07/29/2023 3.59 (L)  3.87 - 5.11 MIL/uL Final   Hemoglobin 07/29/2023 14.1  12.0 - 15.0 g/dL Final   HCT 52/84/1324 40.5  36.0 - 46.0 % Final   MCV 07/29/2023 112.8 (H)  80.0 - 100.0 fL Final   MCH 07/29/2023 39.3 (H)  26.0 - 34.0 pg Final   MCHC  07/29/2023 34.8  30.0 - 36.0 g/dL Final   RDW 40/03/2724 13.1  11.5 - 15.5 % Final   Platelets 07/29/2023 171  150 - 400 K/uL Final   nRBC 07/29/2023 0.0  0.0 - 0.2 % Final   Performed at [facility], [address], [place], [place] [address]   Preg, Serum 07/29/2023 NEGATIVE  NEGATIVE Final   Comment:        THE SENSITIVITY OF THIS METHODOLOGY IS >10 mIU/mL. Performed at [facility], [address], [place], [place] [address]    SARS Coronavirus 2 by RT PCR 07/29/2023 NEGATIVE  NEGATIVE Final   Comment: (NOTE) SARS-CoV-2 target nucleic acids are NOT DETECTED.  The SARS-CoV-2 RNA is generally detectable in upper respiratory specimens during the acute phase of infection. The lowest concentration of SARS-CoV-2 viral copies this assay can detect is 138 copies/mL. A negative result does not preclude SARS-Cov-2 infection and should not be used as the sole basis for treatment or other patient management decisions. A negative result may occur with  improper specimen collection/handling, submission of specimen other than nasopharyngeal swab, presence of viral mutation(s) within the areas targeted by this assay, and inadequate number of viral copies(<138 copies/mL). A negative result must be combined with clinical observations, patient history, and epidemiological information. The expected result is Negative.  Fact Sheet for Patients:  BloggerCourse.com  Fact Sheet for Healthcare Providers:  SeriousBroker.it  This test is no                          t yet approved or cleared by the [place]  FDA and  has been authorized for detection and/or diagnosis of SARS-CoV-2 by FDA under an Emergency Use Authorization (EUA). This EUA will remain  in effect (meaning this test can be used) for the duration of the COVID-19 declaration under Section 564(b)(1) of the Act, 21 U.S.C.section 360bbb-3(b)(1),  unless the authorization is terminated  or revoked sooner.       Influenza A by PCR 07/29/2023 NEGATIVE  NEGATIVE Final   Influenza B by PCR 07/29/2023 NEGATIVE  NEGATIVE Final   Comment: (NOTE) The Xpert Xpress SARS-CoV-2/FLU/RSV plus assay is intended as an aid in the diagnosis of influenza from Nasopharyngeal swab specimens and should not be used as a sole basis for treatment. Nasal washings and aspirates are unacceptable for Xpert Xpress SARS-CoV-2/FLU/RSV testing.  Fact Sheet for Patients: BloggerCourse.com  Fact Sheet for Healthcare Providers: SeriousBroker.it  This test is not yet approved or cleared by the [place]  FDA and has been  authorized for detection and/or diagnosis of SARS-CoV-2 by FDA under an Emergency Use Authorization (EUA). This EUA will remain in effect (meaning this test can be used) for the duration of the COVID-19 declaration under Section 564(b)(1) of the Act, 21 U.S.C. section 360bbb-3(b)(1), unless the authorization is terminated or revoked.     Resp Syncytial Virus by PCR 07/29/2023 NEGATIVE  NEGATIVE Final   Comment: (NOTE) Fact Sheet for Patients: BloggerCourse.com  Fact Sheet for Healthcare Providers: SeriousBroker.it  This test is not yet approved or cleared by the [place]  FDA and has been authorized for detection and/or diagnosis of SARS-CoV-2 by FDA under an Emergency Use Authorization (EUA). This EUA will remain in effect (meaning this test can be used) for the duration of the COVID-19 declaration under Section 564(b)(1) of the Act, 21 U.S.C. section 360bbb-3(b)(1), unless the authorization is terminated or revoked.  Performed at [facility], [address], [place], [place] [address]    Sodium 07/30/2023 136  135 - 145 mmol/L Final   Potassium 07/30/2023 3.4 (L)  3.5 - 5.1 mmol/L Final   Chloride  07/30/2023 100  98 - 111 mmol/L Final   CO2 07/30/2023 25  22 - 32 mmol/L Final   Glucose, Bld 07/30/2023 122 (H)  70 - 99 mg/dL Final   Glucose reference range applies only to samples taken after fasting for at least 8 hours.   BUN 07/30/2023 16  6 - 20 mg/dL Final   Creatinine, Ser 07/30/2023 0.58  0.44 - 1.00 mg/dL Final   Calcium  07/30/2023 9.5  8.9 - 10.3 mg/dL Final   GFR, Estimated 07/30/2023 >60  >60 mL/min Final   Comment: (NOTE) Calculated using the CKD-EPI Creatinine Equation (2021)    Anion gap 07/30/2023 11  5 - 15 Final   Performed at [facility], [address] [place], [place] [address]   Troponin I (High Sensitivity) 07/30/2023 <2  <18 ng/L Final   Comment: (NOTE) Elevated high sensitivity troponin I (hsTnI) values and significant  changes across serial measurements may suggest ACS but many other  chronic and acute conditions are known to elevate hsTnI results.  Refer to the Links section for chest pain algorithms and additional  guidance. Performed at [facility], [address] [place], [place] [address]    Glucose-Capillary 07/30/2023 141 (H)  70 - 99 mg/dL Final   Glucose reference range applies only to samples taken after fasting for at least 8 hours.    Allergies: Molds & smuts, Penicillins, and Fluoxetine  Medications:  Facility Ordered Medications  Medication   acetaminophen  (TYLENOL ) tablet 650 mg   alum & mag hydroxide-simeth (MAALOX/MYLANTA) 200-200-20 MG/5ML suspension 30 mL   magnesium  hydroxide (MILK OF MAGNESIA) suspension 30 mL   [COMPLETED] thiamine  (VITAMIN B1) injection 100 mg   thiamine  (VITAMIN B1) tablet 100 mg   multivitamin with minerals tablet 1 tablet   LORazepam  (ATIVAN ) tablet 1 mg   hydrOXYzine  (ATARAX ) tablet 25 mg   loperamide  (IMODIUM ) capsule 2-4 mg   ondansetron  (ZOFRAN -ODT) disintegrating tablet 4 mg   OLANZapine  zydis (ZYPREXA ) disintegrating tablet 5 mg   OLANZapine  (ZYPREXA )  injection 5 mg   OLANZapine  (ZYPREXA ) injection 10 mg   LORazepam  (ATIVAN ) tablet 1 mg   Followed by   LORazepam  (ATIVAN ) tablet 1 mg   Followed by   Cecily Cohen ON 12/04/2023] LORazepam  (ATIVAN ) tablet 1 mg   Followed by   Cecily Cohen ON 12/06/2023] LORazepam  (ATIVAN ) tablet 1 mg   metFORMIN  (  GLUCOPHAGE -XR) 24 hr tablet 500 mg   [COMPLETED] ibuprofen  (ADVIL ) tablet 400 mg   PTA Medications  Medication Sig   blood glucose meter kit and supplies KIT Dispense based on patient and insurance preference. Use up to four times daily as directed. Please include lancets, test strips, control solution.   Continuous Blood Gluc Sensor (FREESTYLE LIBRE 3 SENSOR) MISC 1 each by Does not apply route every 14 (fourteen) days.   Continuous Glucose Monitor Sup KIT 1 each by Does not apply route continuous.   promethazine  (PHENERGAN ) 25 MG tablet Take 1 tablet (25 mg total) by mouth every 8 (eight) hours as needed for nausea or vomiting.   albuterol  (VENTOLIN  HFA) 108 (90 Base) MCG/ACT inhaler Inhale 2 puffs into the lungs every 4 (four) hours as needed for wheezing or shortness of breath.   metFORMIN  (GLUCOPHAGE -XR) 500 MG 24 hr tablet Take 1 tablet (500 mg total) by mouth daily with breakfast.   diazepam  (VALIUM ) 2 MG tablet Take 1 tablet (2 mg total) by mouth every 6 (six) hours as needed (Dizziness).   rosuvastatin  (CRESTOR ) 5 MG tablet Take 1 tablet (5 mg total) by mouth daily.   meclizine  (ANTIVERT ) 25 MG tablet Take 1 tablet (25 mg total) by mouth 2 (two) times daily as needed for dizziness.    Long Term Goals: Improvement in symptoms so as ready for discharge  Short Term Goals: Patient will verbalize feelings in meetings with treatment team members., Patient will attend at least of 50% of the groups daily., Pt will complete the PHQ9 on admission, day 3 and discharge., Patient will participate in completing the [place] Suicide Severity Rating Scale, Patient will score a low risk of violence for 24 hours prior to  discharge, and Patient will take medications as prescribed daily.  Medical Decision Making  [facility] admission    Recommendations  Based on my evaluation the patient does not appear to have an emergency medical condition.  Dorthea Gauze, NP 12/03/23  4:59 AM

## 2023-12-02 NOTE — Progress Notes (Signed)
   12/02/23 1745  [facility] Triage Screening (Walk-ins at [facility] only)  How Did You Hear About Us ? Family/Friend  What Is the Reason for Your Visit/Call Today? Sara Simpson is a 38 year old female presenting to [facility] accompanied by her daughter. Pt reports that she has been struggling with alcohol for years. Pt mentions that she has been getting worse in the past 2 years. Pt states that she is drinking daily. Pt reports that she had a glass of wine and a shot of vodka today. Pt states she is drinking 10 shots of vodka per day. Pt is looking Pt denies drug use, Si Hi and AVH.  How Long Has This Been Causing You Problems? > than 6 months  Have You Recently Had Any Thoughts About Hurting Yourself? No  Are You Planning to Commit Suicide/Harm Yourself At This time? No  Have you Recently Had Thoughts About Hurting Someone Marigene Shoulder? No  Are You Planning To Harm Someone At This Time? No  Physical Abuse Denies  Verbal Abuse Denies  Sexual Abuse Denies  Exploitation of patient/patient's resources Denies  Self-Neglect Denies  Are you currently experiencing any auditory, visual or other hallucinations? No  Have You Used Any Alcohol or Drugs in the Past 24 Hours? Yes  What Did You Use and How Much? a glass of wine and a shot of vodka  Do you have any current medical co-morbidities that require immediate attention? No  Clinician description of patient physical appearance/behavior: anxious, cooperative  What Do You Feel Would Help You the Most Today? Alcohol or Drug Use Treatment  If access to [facility] was not available, would you have sought care in the Emergency Department? No  Determination of Need Urgent (48 hours)  Options For Referral Facility-Based Crisis  Determination of Need filed? Yes

## 2023-12-03 DIAGNOSIS — F419 Anxiety disorder, unspecified: Secondary | ICD-10-CM | POA: Diagnosis not present

## 2023-12-03 DIAGNOSIS — F1020 Alcohol dependence, uncomplicated: Secondary | ICD-10-CM | POA: Diagnosis not present

## 2023-12-03 DIAGNOSIS — F1994 Other psychoactive substance use, unspecified with psychoactive substance-induced mood disorder: Secondary | ICD-10-CM | POA: Diagnosis not present

## 2023-12-03 DIAGNOSIS — F339 Major depressive disorder, recurrent, unspecified: Secondary | ICD-10-CM | POA: Diagnosis not present

## 2023-12-03 MED ORDER — IBUPROFEN 400 MG PO TABS
400.0000 mg | ORAL_TABLET | ORAL | Status: AC
  Administered 2023-12-03: 400 mg via ORAL
  Filled 2023-12-03: qty 1

## 2023-12-03 NOTE — ED Notes (Signed)
 Patient is sleeping. Respirations equal and unlabored. No change in assessment or acuity. Routine safety checks conducted according to facility protocol.

## 2023-12-03 NOTE — ED Notes (Addendum)
 Pt resting in room aftr making phone call. Pt continues to decline ativan, did not take 2pm scheduled dose, provider notified and spoke with pt. Ciwa-2, bp 148/110 provider notified.  RN recheck with larger cuff- bp 145/98, p 112- standing. Pt affirms to have eaten lunch, no complaints or concerns reported to RN at this time.

## 2023-12-03 NOTE — Group Note (Signed)
 Group Topic: Healthy Self Image and Positive Change  Group Date: 12/03/2023 Start Time: 1650 End Time: 1750 Facilitators: Elfredia Grippe, NT  Department: [facility]  Number of Participants: 7  Group Focus: activities of daily living skills, affirmation, and goals/reality orientation Treatment Modality:  Psychoeducation Interventions utilized were group exercise, mental fitness, and patient education Purpose: enhance coping skills, explore maladaptive thinking, and relapse prevention strategies  Name: Sara Simpson Date of Birth: July 06, 1985  MR: [id number]    Level of Participation: moderate Quality of Participation: attentive Interactions with others: gave feedback Mood/Affect: appropriate Triggers (if applicable): n/a Cognition: concrete Progress: Moderate Response: pt states that she likes to plant and spend time with her cats in order to feel better and change her habits Plan: follow-up needed  Patients Problems:  Patient Active Problem List   Diagnosis Date Noted   Alcohol abuse 12/02/2023   Benign paroxysmal positional vertigo 03/27/2023   Screening examination for STD (sexually transmitted disease) 03/27/2023   Controlled type 2 diabetes mellitus without complication, without long-term current use of insulin (HCC) 01/31/2022   GAD (generalized anxiety disorder) 09/28/2019   Seasonal allergies 09/28/2019   

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
## 2023-12-03 NOTE — ED Notes (Signed)
 Patient observed/assessed at bedside lying in bed. Patient alert and oriented to self and location.. Patient endorsed pain of 4/10 for headache and endorsed anxiety. Medication was administered for pain and anxiety. He denies A/V/H. He denies having any thoughts/plan of self harm and harm towards others. Fluid and snack offered. Patient states that appetite has been good throughout the day. Verbalizes no further complaints at this time.

## 2023-12-03 NOTE — Group Note (Signed)
 Group Topic: Change and Accountability  Group Date: 12/03/2023 Start Time: 1710 End Time: 1740 Facilitators: Pennie Box, RN  Department: [facility]  Number of Participants: 7  Group Focus: anxiety, depression, and nursing group Treatment Modality:  Psychoeducation Interventions utilized were exploration and group exercise Purpose: reinforce self-care and relapse prevention strategies  Name: Sara Simpson Date of Birth: Mar 26, 1986  MR: [id number]    Level of Participation: active Quality of Participation: attentive and cooperative Interactions with others: gave feedback Mood/Affect: appropriate Triggers (if applicable): n/a Cognition: coherent/clear Progress: Significant Response: behavioral activation- pt says she will take care of cats and plants Plan: patient will be encouraged to attend future nursing groups  Patients Problems:  Patient Active Problem List   Diagnosis Date Noted   Alcohol abuse 12/02/2023   Benign paroxysmal positional vertigo 03/27/2023   Screening examination for STD (sexually transmitted disease) 03/27/2023   Controlled type 2 diabetes mellitus without complication, without long-term current use of insulin (HCC) 01/31/2022   GAD (generalized anxiety disorder) 09/28/2019   Seasonal allergies 09/28/2019   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

## 2023-12-03 NOTE — ED Notes (Signed)
 Pt reports anxiety. Pt declined scheduled dose of atival this morning, saying she 'did not feel comfortable'. RN provided education, pt continued to decline. Pt denies si hi avh- verbal contract for safety provided. Pt denies physical discomforts or complaints.

## 2023-12-03 NOTE — ED Provider Notes (Signed)
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 Patients: BloggerCourse.com  Fact Sheet for Healthcare Providers: SeriousBroker.it  This test is not yet approved or cleared by the [place]  FDA and has been authorized for detection and/or diagnosis of SARS-CoV-2 by FDA under an Emergency Use Authorization (EUA). This EUA will remain in effect (meaning this test can be used) for the duration of the COVID-19 declaration under Section 564(b)(1) of the Act, 21 U.S.C. section 360bbb-3(b)(1), unless the authorization is terminated or revoked.  Performed at [facility], [address], [place], [place] [address]     Sodium 07/30/2023 136  135 - 145 mmol/L Final   Potassium 07/30/2023 3.4 (L)  3.5 - 5.1 mmol/L Final   Chloride 07/30/2023 100  98 - 111 mmol/L Final   CO2 07/30/2023 25  22 - 32 mmol/L Final   Glucose, Bld 07/30/2023 122 (H)  70 - 99 mg/dL Final   Glucose reference range applies only to samples taken after fasting for at least 8 hours.   BUN 07/30/2023 16  6 - 20 mg/dL Final   Creatinine, Ser 07/30/2023 0.58  0.44 - 1.00 mg/dL Final   Calcium  07/30/2023 9.5  8.9 - 10.3 mg/dL Final   GFR, Estimated 07/30/2023 >60  >60 mL/min Final   Comment: (NOTE) Calculated using the CKD-EPI Creatinine Equation (2021)    Anion gap 07/30/2023 11  5 - 15 Final   Performed at [facility], [address], [place], [place] [address]   Troponin I (High Sensitivity) 07/30/2023 <2  <18 ng/L Final   Comment: (NOTE) Elevated high sensitivity troponin I (hsTnI) values and significant  changes across serial measurements may suggest ACS but many other  chronic and acute conditions are known to elevate hsTnI results.  Refer to the Links section for chest pain algorithms and additional  guidance. Performed at [facility], [address], [place], [place] [address]    Glucose-Capillary 07/30/2023 141 (H)  70 - 99 mg/dL Final   Glucose reference range applies only to samples taken after fasting for at least 8 hours.    Blood  Alcohol level:  Lab Results  Component Value Date   ETH 21 (H) 12/02/2023    Metabolic Disorder Labs: Lab Results  Component Value Date   HGBA1C 5.6 11/14/2023   HGBA1C 5.6 11/14/2023   MPG 111 09/18/2019   No results found for: PROLACTIN Lab Results  Component Value Date   CHOL 246 (H) 11/14/2023   TRIG 91 11/14/2023   HDL 102 11/14/2023   CHOLHDL 2.4 11/14/2023   LDLCALC 129 (H) 11/14/2023   LDLCALC 104 (H) 03/27/2023    Therapeutic Lab Levels: No results found for: LITHIUM No results found for: VALPROATE No results found  for: CBMZ  Physical Findings   AUDIT    Flowsheet Row ED from 12/02/2023 in [facility]  Alcohol Use Disorder Identification Test Final Score (AUDIT) 31      GAD-7    Flowsheet Row Office Visit from 11/14/2023 in [facility] Primary [facility] & [facility] Medicine at [facility] Office Visit from 06/06/2022 in [facility] Primary Care & Sports Medicine at [facility] Office Visit from 11/03/2019 in [facility] Primary Care & Sports Medicine at [facility] Office Visit from 09/28/2019 in [facility] Primary Care & Sports Medicine at [facility] Office Visit from 01/26/2019 in [facility] Primary Care & Sports Medicine at [facility]  Total GAD-7 Score 10 9 0 0 5      PHQ2-9    Flowsheet Row ED from 12/02/2023 in [facility] Office Visit from 11/14/2023 in [facility] Primary Care & Sports Medicine at [facility] Office Visit from 08/07/2022 in [facility] Primary Care & Sports Medicine at [facility] Office Visit from 06/06/2022 in [facility] Primary Care & Sports Medicine at [facility] Office Visit from 02/01/2022 in [facility] Primary Care & Sports Medicine at [facility]  PHQ-2 Total Score 4 5 0 4 0  PHQ-9 Total Score 11 15 -- 16 --      Flowsheet Row ED from 12/02/2023 in [facility] Most recent reading at 12/02/2023  9:44 PM ED from 12/02/2023 in [facility] Most recent reading at 12/02/2023  6:00 PM ED from 08/19/2023 in [facility] Emergency Department at [facility] Most recent reading at 08/19/2023  8:39 AM  C-SSRS RISK CATEGORY No Risk No Risk No Risk        Musculoskeletal  Strength & Muscle Tone: within normal limits Gait & Station: normal Patient leans: N/A  Psychiatric Specialty Exam  Presentation  General Appearance:  Casual  Eye Contact: Good  Speech: Clear  and Coherent  Speech Volume: Normal  Handedness: Right   Mood and Affect  Mood: Anxious  Affect: Congruent   Thought Process  Thought Processes: Coherent  Descriptions of Associations:Intact  Orientation:Full (Time, Place and Person)  Thought Content:Logical  Diagnosis of Schizophrenia or Schizoaffective disorder in past: No    Hallucinations:Hallucinations: None  Ideas of Reference:None  Suicidal Thoughts:Suicidal Thoughts: No  Homicidal Thoughts:Homicidal Thoughts: No   Sensorium  Memory: Immediate Good  Judgment: Fair  Insight: Fair   Art therapist  Concentration: Fair  Attention Span: Fair  Recall: Good  Fund of Knowledge: Good  Language: Good   Psychomotor Activity  Psychomotor Activity: Psychomotor Activity: Normal   Assets  Assets: Desire for Improvement; Resilience   Sleep  Sleep: Sleep: Fair   Nutritional Assessment (For OBS and [facility] admissions only) Has the patient had a weight loss or gain of 10 pounds or  more in the last 3 months?: No Has the patient had a decrease in food intake/or appetite?: No Does the patient have dental problems?: No Does the patient have eating habits or behaviors that may be indicators of an eating disorder including binging or inducing vomiting?: No Has the patient recently lost weight without trying?: 0 Has the patient been eating poorly because of a decreased appetite?: 0 Malnutrition Screening Tool Score: 0    Physical Exam  Physical Exam ROS Physical Exam Constitutional:      Appearance: the patient is not toxic-appearing.  Pulmonary:     Effort: Pulmonary effort is normal.  Neurological:     General: No focal deficit present.     Mental Status: the patient is alert and oriented to person, place, and time.     Mild tremor noted on BUE  Review of Systems  Respiratory:  Negative for shortness of breath.   Cardiovascular:  Negative for chest pain.  Gastrointestinal:   Negative for abdominal pain, constipation, diarrhea, nausea and vomiting.  Neurological:  Negative for headaches.   Blood pressure (!) 148/99, pulse 74, temperature 98 F (36.7 C), temperature source Oral, resp. rate 18, SpO2 100%. There is no height or weight on file to calculate BMI.  Treatment Plan Summary: Daily contact with patient to assess and evaluate symptoms and progress in treatment, Medication management, and Plan    Patient is a 38 year old female with a past psychiatric history of MDD, GAD, alcohol use disorder, no past psychiatric hospitalizations or suicide attempts who presented to the behavioral health urgent care with her daughter for alcohol detox and is interested in residential treatment.   Patient currently experiencing mild withdrawal symptoms of tremor. She is also hypertensive which is baseline but suspect exacerbated from alcohol withdrawal. Encouraged patient to use ativan taper to help with withdrawal symptoms, will continue to monitor CIWA (in 24 hours was 6, 7, 1). She has severe alcohol use disorder that is impacting occupational and social functioning. Discussed starting acamprosate in the following days given hx of elevated LFTs and possibly fatty liver. She also has history of MDD and GAD which was treated with zoloft . Currently substance-induced mood disorder given her ongoing daily alcohol use but will restart zoloft  given prior efficacy and as she is coming off of alcohol. Will continue to monitor.   Status: VOL   Labs:  Alcohol level 21 on admission.  UDS negative.  TSH within normal limits.  MCV 107.5. CMP pending. EKG Qtc 479.   #Alcohol use disorder -CIWA -continue ativan taper (6/9-6/13) -PRN ativan for CIWA >10  -PRN: atarax , tylenol, maalox, imodium, milk of mg, zofran -daily MV, thiamine  -consider starting acamprosate tomorrow   #Substance-induced mood disorder #History of MDD, GAD  --start zoloft  25mg  for depression and anxiety if CMP returns  back wnl    #DM  -continue home metformin    Norbert Bean, MD, PGY-2 12/03/2023 10:35 AM  This case was discussed with attending Dr. Genita Keys who agrees with the above formulated treatment plan. Please see attending attestation for additional details.   This note was created using a voice recognition software as a result there may be grammatical errors inadvertently enclosed that do not reflect the nature of this encounter. Every attempt is made to correct such errors.

## 2023-12-03 NOTE — ED Notes (Signed)
 Staff unable to obtain lab work. At time of lab attempt, Pt requested no more additional attempts because she will get light headed and dizzy.

## 2023-12-03 NOTE — ED Notes (Signed)
 Pt in bed w/eyes closed resting quietly. Respirations equal and unlabored. No signs or symptoms of distress. Routine safety checks maintained/ongoing.

## 2023-12-03 NOTE — Discharge Instructions (Addendum)
 Patient will be discharging to [facility] on Monday 6/16 by 9:00am with transportation provided via [employer] needing to arrive by 8:30 am for pick up.  Address is [address] [place], [place] [address]. Number to call for emergency is [phone]. Update has been provided to the patient and MD made aware. Patient will need a 7-14 day supply of medication and one month refill. No nicotine gum allowed, however patches are if ordered. No other needs to report at this time.   Liam Redhead, LCSW Clinical Social Worker [place] Ph: [phone]

## 2023-12-03 NOTE — ED Notes (Addendum)
 Pt in bed w/eyes open resting quietly. Respirations equal and unlabored, skin warm and dry. Pt was offered support and encouragement. Pt receptive to treatment and safety maintained on unit. Routine safety checks maintained/ongoing.

## 2023-12-04 DIAGNOSIS — F419 Anxiety disorder, unspecified: Secondary | ICD-10-CM | POA: Diagnosis not present

## 2023-12-04 DIAGNOSIS — F339 Major depressive disorder, recurrent, unspecified: Secondary | ICD-10-CM | POA: Diagnosis not present

## 2023-12-04 DIAGNOSIS — F1020 Alcohol dependence, uncomplicated: Secondary | ICD-10-CM | POA: Diagnosis not present

## 2023-12-04 DIAGNOSIS — F1994 Other psychoactive substance use, unspecified with psychoactive substance-induced mood disorder: Secondary | ICD-10-CM | POA: Diagnosis not present

## 2023-12-04 LAB — COMPREHENSIVE METABOLIC PANEL WITH GFR
ALT: 66 U/L — ABNORMAL HIGH (ref 0–44)
AST: 92 U/L — ABNORMAL HIGH (ref 15–41)
Albumin: 3.4 g/dL — ABNORMAL LOW (ref 3.5–5.0)
Alkaline Phosphatase: 85 U/L (ref 38–126)
Anion gap: 12 (ref 5–15)
BUN: 7 mg/dL (ref 6–20)
CO2: 24 mmol/L (ref 22–32)
Calcium: 9.7 mg/dL (ref 8.9–10.3)
Chloride: 102 mmol/L (ref 98–111)
Creatinine, Ser: 0.51 mg/dL (ref 0.44–1.00)
GFR, Estimated: >60 mL/min (ref >60)
Glucose, Bld: 136 mg/dL — ABNORMAL HIGH (ref 70–99)
Potassium: 3.7 mmol/L (ref 3.5–5.1)
Sodium: 138 mmol/L (ref 135–145)
Total Bilirubin: 1.8 mg/dL — ABNORMAL HIGH (ref 0.0–1.2)
Total Protein: 8 g/dL (ref 6.5–8.1)

## 2023-12-04 MED ORDER — HYDROXYZINE HCL 25 MG PO TABS
50.0000 mg | ORAL_TABLET | Freq: Every evening | ORAL | Status: DC | PRN
  Administered 2023-12-05 – 2023-12-07 (×2): 50 mg via ORAL
  Administered 2023-12-08: 25 mg via ORAL
  Filled 2023-12-04 (×5): qty 2
  Filled 2023-12-04: qty 14

## 2023-12-04 MED ORDER — IBUPROFEN 400 MG PO TABS
400.0000 mg | ORAL_TABLET | Freq: Once | ORAL | Status: AC
  Administered 2023-12-04: 400 mg via ORAL
  Filled 2023-12-04: qty 1

## 2023-12-04 MED ORDER — LORAZEPAM 1 MG PO TABS: 1.0000 mg | ORAL_TABLET | ORAL | Status: AC | PRN

## 2023-12-04 MED ORDER — ACAMPROSATE CALCIUM 333 MG PO TBEC
666.0000 mg | DELAYED_RELEASE_TABLET | Freq: Three times a day (TID) | ORAL | Status: DC
  Administered 2023-12-04 – 2023-12-09 (×15): 666 mg via ORAL
  Filled 2023-12-04 (×8): qty 2
  Filled 2023-12-04: qty 42
  Filled 2023-12-04 (×7): qty 2

## 2023-12-04 MED ORDER — FOLIC ACID 1 MG PO TABS
1.0000 mg | ORAL_TABLET | Freq: Every day | ORAL | Status: DC
  Administered 2023-12-04 – 2023-12-09 (×6): 1 mg via ORAL
  Filled 2023-12-04 (×6): qty 1

## 2023-12-04 NOTE — ED Provider Notes (Signed)
 Behavioral Health Progress Note  Date and Time: 12/04/2023 8:12 AM Name: Sara Simpson Blanchette MRN:  [id number]  Chart review: Vitals stable. CIWA last 24 hours was 2, 5, 6 (tremor, anxiety, headache). She continued to refuse ativan taper. Compliant with other medications. She took PRN hydroxyzine , tylenol. CMP still pending, patient declined lab attempt due to feeling lightheaded and dizzy. SW sending out rehab referrals. No acute events overnight.   Subjective:   Patient is a 38 year old female with a past psychiatric history of MDD, GAD, alcohol use disorder, no past psychiatric hospitalizations or suicide attempts who presented to the behavioral health urgent care with her daughter for alcohol detox and is interested in residential treatment.  Patient is seen in her room. She reports poor sleep, reports she was feeling overall itchy, suspect due to shampoo products. She is not noted to have hives. Discussed will continue to monitor and can also increase her hydroxyzine  for sleep. She reports no issues with appetite. She reports a mild headache and noted to have mild tremor in her hands. Discussed her refusal of ativan, she reports she has seen people on ativan before and she doesn't want to be zonked out. Provided education regarding ativan but she continues to decline. Discussed with her will change to symptom triggered but if she starts having severe withdrawal symptoms we will have to send her to the ED. She reports her mood as numb, sad about everything. She becomes tearful and discusses shame and guilt surrounding her alcohol use. She denies current cravings for alcohol. She denies SI/HI/AVH. She reports she has talked with her family and they have been supportive. Discussed past diagnosis of PTSD, she reports that she was diagnosed years ago and used to have nightmares but hasn't had any recently. She gives permission to contact her son Haroldine Likens for collateral and safety planning.     Diagnosis:  Final diagnoses:  Recurrent major depressive disorder, remission status unspecified (HCC)  Alcohol use disorder, severe, dependence (HCC)  Anxiety disorder, unspecified type  Substance induced mood disorder (HCC)   Past Psychiatric History:  She reports past psychiatric history of MDD, GAD.  She reports that she saw a psychiatrist once for a vocational scholarship program.  She reports that she has been on Zoloft  and Prozac in the past.  She reports that Zoloft  helped for her depression and anxiety but when she was drinking it made her feel more anxiety and edgy.  She also reports being prescribed Valium  in the past for anxiety.  She denies having seen a therapist in the past.  She denies past psychiatric hospitalizations or suicide attempts.  Past Medical History:  She reports a medical history of diabetes on metformin .  Per chart review, patient also has history of hypertension and generalized anxiety disorder and possibly fatty liver disease as well as elevated LFTs.  She reports allergies to penicillin and Prozac.  She reports that Prozac made her feel crazy and she was not herself.  Family History: None reported  Social History: She reports that she lives with her 66 year old daughter and 90 year old son.  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  She denies any access to guns.  She denies any legal issues.  She reports that her children are her main support system.  Additional Social History:    Pain Medications: See MAR Prescriptions: See MAR Over the Counter: See MAR History of alcohol / drug use?: Yes Longest period of sobriety (when/how long): Unknown Negative Consequences of Use: Personal  relationships, Financial Withdrawal Symptoms: Blackouts, Patient aware of relationship between substance abuse and physical/medical complications, Tremors Name of Substance 1: Alcohol 1 - Age of First Use: 7 1 - Amount (size/oz): about 10 shots or more daily of liquor 1 -  Frequency: daily 1 - Duration: ongoing 1 - Last Use / Amount: today, glass of wine/ one shot of vodka 1 - Method of Aquiring: store 1- Route of Use: oral consumption                  Current Medications:  Current Facility-Administered Medications  Medication Dose Route Frequency Provider Last Rate Last Admin   acetaminophen (TYLENOL) tablet 650 mg  650 mg Oral Q6H PRN Dorthea Gauze, NP   650 mg at 12/03/23 2149   alum & mag hydroxide-simeth (MAALOX/MYLANTA) 200-200-20 MG/5ML suspension 30 mL  30 mL Oral Q4H PRN Dorthea Gauze, NP       hydrOXYzine  (ATARAX ) tablet 25 mg  25 mg Oral Q6H PRN Dorthea Gauze, NP   25 mg at 12/03/23 2150   loperamide (IMODIUM) capsule 2-4 mg  2-4 mg Oral PRN Dorthea Gauze, NP       LORazepam (ATIVAN) tablet 1 mg  1 mg Oral Q6H PRN Dorthea Gauze, NP       LORazepam (ATIVAN) tablet 1 mg  1 mg Oral TID Dorthea Gauze, NP       Followed by   LORazepam (ATIVAN) tablet 1 mg  1 mg Oral BID Dorthea Gauze, NP       Followed by   Cecily Cohen ON 12/06/2023] LORazepam (ATIVAN) tablet 1 mg  1 mg Oral Daily Dorthea Gauze, NP       magnesium hydroxide (MILK OF MAGNESIA) suspension 30 mL  30 mL Oral Daily PRN Dorthea Gauze, NP       metFORMIN  (GLUCOPHAGE -XR) 24 hr tablet 500 mg  500 mg Oral Q breakfast Dorthea Gauze, NP   500 mg at 12/03/23 1610   multivitamin with minerals tablet 1 tablet  1 tablet Oral Daily Dorthea Gauze, NP   1 tablet at 12/03/23 0918   OLANZapine (ZYPREXA) injection 10 mg  10 mg Intramuscular TID PRN Dorthea Gauze, NP       OLANZapine (ZYPREXA) injection 5 mg  5 mg Intramuscular TID PRN Dorthea Gauze, NP       OLANZapine zydis (ZYPREXA) disintegrating tablet 5 mg  5 mg Oral TID PRN Dorthea Gauze, NP       ondansetron (ZOFRAN-ODT) disintegrating tablet 4 mg  4 mg Oral Q6H PRN Dorthea Gauze, NP       thiamine (VITAMIN B1) tablet 100 mg  100 mg Oral Daily Dorthea Gauze, NP   100 mg at 12/03/23 9604   Current Outpatient Medications  Medication Sig Dispense  Refill   albuterol  (VENTOLIN  HFA) 108 (90 Base) MCG/ACT inhaler Inhale 2 puffs into the lungs every 4 (four) hours as needed for wheezing or shortness of breath.     blood glucose meter kit and supplies KIT Dispense based on patient and insurance preference. Use up to four times daily as directed. Please include lancets, test strips, control solution. 1 each 0   Continuous Blood Gluc Sensor (FREESTYLE LIBRE 3 SENSOR) MISC 1 each by Does not apply route every 14 (fourteen) days. 6 each 1   Continuous Glucose Monitor Sup KIT 1 each by Does not apply route continuous. 1 kit 0   metFORMIN  (GLUCOPHAGE -XR) 500 MG 24 hr tablet Take 1 tablet (500 mg total) by mouth daily with  breakfast. 90 tablet 3   rosuvastatin  (CRESTOR ) 5 MG tablet Take 1 tablet (5 mg total) by mouth daily. 90 tablet 3   diazepam  (VALIUM ) 2 MG tablet Take 1 tablet (2 mg total) by mouth every 6 (six) hours as needed (Dizziness). (Patient not taking: Reported on 12/03/2023) 15 tablet 0   meclizine  (ANTIVERT ) 25 MG tablet Take 1 tablet (25 mg total) by mouth 2 (two) times daily as needed for dizziness. (Patient not taking: Reported on 12/03/2023) 90 tablet 1   promethazine  (PHENERGAN ) 25 MG tablet Take 1 tablet (25 mg total) by mouth every 8 (eight) hours as needed for nausea or vomiting. (Patient not taking: Reported on 12/03/2023) 20 tablet 0    Labs  Lab Results:  Admission on 12/02/2023  Component Date Value Ref Range Status   WBC 12/02/2023 9.1  4.0 - 10.5 K/uL Final   RBC 12/02/2023 3.74 (L)  3.87 - 5.11 MIL/uL Final   Hemoglobin 12/02/2023 14.6  12.0 - 15.0 g/dL Final   HCT 52/84/1324 40.2  36.0 - 46.0 % Final   MCV 12/02/2023 107.5 (H)  80.0 - 100.0 fL Final   MCH 12/02/2023 39.0 (H)  26.0 - 34.0 pg Final   MCHC 12/02/2023 36.3 (H)  30.0 - 36.0 g/dL Final   RDW 40/03/2724 11.9  11.5 - 15.5 % Final   Platelets 12/02/2023 212  150 - 400 K/uL Final   nRBC 12/02/2023 0.0  0.0 - 0.2 % Final   Neutrophils Relative % 12/02/2023 54  %  Final   Neutro Abs 12/02/2023 4.8  1.7 - 7.7 K/uL Final   Lymphocytes Relative 12/02/2023 38  % Final   Lymphs Abs 12/02/2023 3.5  0.7 - 4.0 K/uL Final   Monocytes Relative 12/02/2023 6  % Final   Monocytes Absolute 12/02/2023 0.6  0.1 - 1.0 K/uL Final   Eosinophils Relative 12/02/2023 1  % Final   Eosinophils Absolute 12/02/2023 0.1  0.0 - 0.5 K/uL Final   Basophils Relative 12/02/2023 1  % Final   Basophils Absolute 12/02/2023 0.1  0.0 - 0.1 K/uL Final   Immature Granulocytes 12/02/2023 0  % Final   Abs Immature Granulocytes 12/02/2023 0.02  0.00 - 0.07 K/uL Final   Performed at [facility], [address] [place], [place] [address]   Alcohol, Ethyl (B) 12/02/2023 21 (H)  <15 mg/dL Final   Comment: (NOTE) For medical purposes only. Performed at [facility], [address] [place], [place] [address]    TSH 12/02/2023 3.227  0.350 - 4.500 uIU/mL Final   Comment: Performed by a 3rd Generation assay with a functional sensitivity of <=0.01 uIU/mL. Performed at [facility], [address], [place], [place] [address]    Preg Test, Ur 12/02/2023 Negative  Negative Final   POC Amphetamine UR 12/02/2023 None Detected  NONE DETECTED (Cut Off Level 1000 ng/mL) Final   POC Secobarbital (BAR) 12/02/2023 None Detected  NONE DETECTED (Cut Off Level 300 ng/mL) Final   POC Buprenorphine (BUP) 12/02/2023 None Detected  NONE DETECTED (Cut Off Level 10 ng/mL) Final   POC Oxazepam (BZO) 12/02/2023 None Detected  NONE DETECTED (Cut Off Level 300 ng/mL) Final   POC Cocaine UR 12/02/2023 None Detected  NONE DETECTED (Cut Off Level 300 ng/mL) Final   POC Methamphetamine UR 12/02/2023 None Detected  NONE DETECTED (Cut Off Level 1000 ng/mL) Final   POC Morphine 12/02/2023 None Detected  NONE DETECTED (Cut Off Level 300 ng/mL) Final  POC Methadone UR 12/02/2023 None Detected  NONE DETECTED (Cut Off Level 300 ng/mL) Final   POC Oxycodone UR 12/02/2023 None Detected  NONE DETECTED (Cut Off  Level 100 ng/mL) Final   POC Marijuana UR 12/02/2023 None Detected  NONE DETECTED (Cut Off Level 50 ng/mL) Final  Office Visit on 11/14/2023  Component Date Value Ref Range Status   Hemoglobin A1C 11/14/2023 5.6  4.0 - 5.6 % Final   HbA1c, POC (controlled diabetic ra* 11/14/2023 5.6  0.0 - 7.0 % Final   Microalbumin Ur, POC 11/14/2023 30  mg/L Final   Creatinine, POC 11/14/2023 100  mg/dL Final   Albumin/Creatinine Ratio, Urine, P* 11/14/2023 <30   Final   Hepatitis B Surface Ag 11/14/2023 Negative  Negative Final   Hep B Surface Ab, Qual 11/14/2023 Non Reactive   Final   Comment:              Non Reactive: Not immune to HBV infection.              Equivocal: Unable to determine if anti-HBs                         is present at levels consistent                         with immunity.               Reactive: Anti-HBs concentration detected                         at greater than 10 mIU/mL.                         Individual is considered to be                         immune to infection with HBV.    Hep B Core Total Ab 11/14/2023 Negative  Negative Final   Rfx to HBc IgM 11/14/2023 Comment   Final   Reflex criteria was not met.   Interpretation 11/14/2023 Comment   Final   Comment:                    HBV Serology Interpretation Chart  -------------------------------------------------------------------  Interpretation             HBsAg  anti-HBs  anti-HBc  anti-HBc IgM  -------------------------------------------------------------------  Key - Analyte present: + Analyte absent: - Test not indicated: TNI  -------------------------------------------------------------------  Susceptible (never  infected and no evidence    -        -         -          TNI  of vaccination)  -------------------------------------------------------------------  Immune due to natural  resolved infection          -        +         +          TNI   -------------------------------------------------------------------  Immune due to vaccination   -        +         -          TNI  -------------------------------------------------------------------  Acute Infection             +        -         +           +  -------------------------------------------------------------------  Chronic infection           +        -         +           -  -------------------------------------------------------------------  Interpretation unclear*     -        -         +          +/-  -------------------------------------------------------------------  *Multiple possibilities: resolved infection (most common); false-   positive anti-HBc (susceptible); low-level chronic infection;   resolving acute infection.    HCV Ab 11/14/2023 Non Reactive  Non Reactive Final   RPR Ser Ql 11/14/2023 Non Reactive  Non Reactive Final   HIV Screen 4th Generation wRfx 11/14/2023 Non Reactive  Non Reactive Final   Comment: HIV-1/HIV-2 antibodies and HIV-1 p24 antigen were NOT detected. There is no laboratory evidence of HIV infection. HIV Negative    Chlamydia by NAA 11/14/2023 Negative  Negative Final   Gonococcus by NAA 11/14/2023 Negative  Negative Final   Trich vag by NAA 11/14/2023 Negative  Negative Final   WBC 11/14/2023 7.4  3.4 - 10.8 x10E3/uL Final   RBC 11/14/2023 3.83  3.77 - 5.28 x10E6/uL Final   Hemoglobin 11/14/2023 15.0  11.1 - 15.9 g/dL Final   Hematocrit 16/03/9603 42.1  34.0 - 46.6 % Final   MCV 11/14/2023 110 (H)  79 - 97 fL Final   MCH 11/14/2023 39.2 (H)  26.6 - 33.0 pg Final   MCHC 11/14/2023 35.6  31.5 - 35.7 g/dL Final   RDW 54/02/8118 12.6  11.7 - 15.4 % Final   Platelets 11/14/2023 204  150 - 450 x10E3/uL Final   Neutrophils 11/14/2023 48  Not Estab. % Final   Lymphs 11/14/2023 41  Not Estab. % Final   Monocytes 11/14/2023 7  Not Estab. % Final   Eos 11/14/2023 3  Not Estab. % Final   Basos 11/14/2023 1  Not  Estab. % Final   Neutrophils Absolute 11/14/2023 3.5  1.4 - 7.0 x10E3/uL Final   Lymphocytes Absolute 11/14/2023 3.0  0.7 - 3.1 x10E3/uL Final   Monocytes Absolute 11/14/2023 0.5  0.1 - 0.9 x10E3/uL Final   EOS (ABSOLUTE) 11/14/2023 0.2  0.0 - 0.4 x10E3/uL Final   Basophils Absolute 11/14/2023 0.1  0.0 - 0.2 x10E3/uL Final   Immature Granulocytes 11/14/2023 0  Not Estab. % Final   Immature Grans (Abs) 11/14/2023 0.0  0.0 - 0.1 x10E3/uL Final   Glucose 11/14/2023 140 (H)  70 - 99 mg/dL Final   BUN 14/78/2956 10  6 - 20 mg/dL Final   Creatinine, Ser 11/14/2023 0.48 (L)  0.57 - 1.00 mg/dL Final   eGFR 21/30/8657 123  >59 mL/min/1.73 Final   BUN/Creatinine Ratio 11/14/2023 21  9 - 23 Final   Sodium 11/14/2023 137  134 - 144 mmol/L Final   Potassium 11/14/2023 4.6  3.5 - 5.2 mmol/L Final   Chloride 11/14/2023 99  96 - 106 mmol/L Final   CO2 11/14/2023 24  20 - 29 mmol/L Final   Calcium  11/14/2023 9.7  8.7 - 10.2 mg/dL Final   Total Protein 84/69/6295 8.2  6.0 - 8.5 g/dL Final   Albumin 28/41/3244 4.2  3.9 - 4.9 g/dL Final   Globulin, Total 11/14/2023 4.0  1.5 - 4.5 g/dL Final   Bilirubin Total 11/14/2023 1.4 (H)  0.0 - 1.2 mg/dL Final   Alkaline Phosphatase 11/14/2023 88  44 - 121 IU/L Final  AST 11/14/2023 86 (H)  0 - 40 IU/L Final   ALT 11/14/2023 70 (H)  0 - 32 IU/L Final   Cholesterol, Total 11/14/2023 246 (H)  100 - 199 mg/dL Final   Triglycerides 69/62/9528 91  0 - 149 mg/dL Final   HDL 41/32/4401 102  >39 mg/dL Final   VLDL Cholesterol Cal 11/14/2023 15  5 - 40 mg/dL Final   LDL Chol Calc (NIH) 11/14/2023 129 (H)  0 - 99 mg/dL Final   Chol/HDL Ratio 11/14/2023 2.4  0.0 - 4.4 ratio Final   Comment:                                   T. Chol/HDL Ratio                                             Men  Women                               1/2 Avg.Risk  3.4    3.3                                   Avg.Risk  5.0    4.4                                2X Avg.Risk  9.6    7.1                                 3X Avg.Risk 23.4   11.0    Ethanol 11/14/2023 <.010  Cutoff=0.010 % Final   TSH 11/14/2023 2.710  0.450 - 4.500 uIU/mL Final   Trichomonas Exam 11/14/2023 Negative  Negative Final   Yeast Exam 11/14/2023 Negative  Negative Final   Clue Cell Exam 11/14/2023 Positive (A)  Negative Final   Color, UA 11/14/2023 yellow  yellow Final   Clarity, UA 11/14/2023 clear  clear Final   Glucose, UA 11/14/2023 negative  negative mg/dL Final   Bilirubin, UA 02/72/5366 negative  negative Final   Ketones, POC UA 11/14/2023 negative  negative mg/dL Final   Spec Grav, UA 44/08/4740 1.015  1.010 - 1.025 Final   Blood, UA 11/14/2023 negative  negative Final   pH, UA 11/14/2023 7.5  5.0 - 8.0 Final   POC PROTEIN,UA 11/14/2023 negative  negative, trace Final   Urobilinogen, UA 11/14/2023 4.0 (A)  0.2 or 1.0 E.U./dL Final   Nitrite, UA 59/56/3875 Negative  Negative Final   Leukocytes, UA 11/14/2023 Negative  Negative Final   Urine Culture, Routine 11/14/2023 Final report   Final   Organism ID, Bacteria 11/14/2023 Comment   Final   Comment: Mixed urogenital flora Less than 10,000 colonies/mL    HCV Interp 1: 11/14/2023 Comment   Final   Comment: Not infected with HCV unless early or acute infection is suspected (which may be delayed in an immunocompromised individual), or other evidence exists to indicate HCV infection.   Admission on 08/19/2023, Discharged on 08/19/2023  Component Date Value Ref Range Status   Sodium 08/19/2023  137  135 - 145 mmol/L Final   Potassium 08/19/2023 3.5  3.5 - 5.1 mmol/L Final   Chloride 08/19/2023 105  98 - 111 mmol/L Final   CO2 08/19/2023 21 (L)  22 - 32 mmol/L Final   Glucose, Bld 08/19/2023 198 (H)  70 - 99 mg/dL Final   Glucose reference range applies only to samples taken after fasting for at least 8 hours.   BUN 08/19/2023 13  6 - 20 mg/dL Final   Creatinine, Ser 08/19/2023 0.44  0.44 - 1.00 mg/dL Final   Calcium  08/19/2023 9.0  8.9 - 10.3 mg/dL  Final   GFR, Estimated 08/19/2023 >60  >60 mL/min Final   Comment: (NOTE) Calculated using the CKD-EPI Creatinine Equation (2021)    Anion gap 08/19/2023 11  5 - 15 Final   Performed at [facility], [address] [place], [place] [address]   WBC 08/19/2023 5.5  4.0 - 10.5 K/uL Final   RBC 08/19/2023 3.57 (L)  3.87 - 5.11 MIL/uL Final   Hemoglobin 08/19/2023 13.8  12.0 - 15.0 g/dL Final   HCT 78/29/5621 39.7  36.0 - 46.0 % Final   MCV 08/19/2023 111.2 (H)  80.0 - 100.0 fL Final   MCH 08/19/2023 38.7 (H)  26.0 - 34.0 pg Final   MCHC 08/19/2023 34.8  30.0 - 36.0 g/dL Final   RDW 30/86/5784 12.7  11.5 - 15.5 % Final   Platelets 08/19/2023 183  150 - 400 K/uL Final   nRBC 08/19/2023 0.0  0.0 - 0.2 % Final   Performed at [facility], [address], [place], [place] [address]   Preg, Serum 08/19/2023 NEGATIVE  NEGATIVE Final   Comment:        THE SENSITIVITY OF THIS METHODOLOGY IS >10 mIU/mL. Performed at [facility], [address] [place], [place] [address]   Admission on 07/29/2023, Discharged on 07/30/2023  Component Date Value Ref Range Status   WBC 07/29/2023 5.2  4.0 - 10.5 K/uL Final   RBC 07/29/2023 3.59 (L)  3.87 - 5.11 MIL/uL Final   Hemoglobin 07/29/2023 14.1  12.0 - 15.0 g/dL Final   HCT 32/44/0102 40.5  36.0 - 46.0 % Final   MCV 07/29/2023 112.8 (H)  80.0 - 100.0 fL Final   MCH 07/29/2023 39.3 (H)  26.0 - 34.0 pg Final   MCHC 07/29/2023 34.8  30.0 - 36.0 g/dL Final   RDW 72/53/6644 13.1  11.5 - 15.5 % Final   Platelets 07/29/2023 171  150 - 400 K/uL Final   nRBC 07/29/2023 0.0  0.0 - 0.2 % Final   Performed at [facility], [address], [place], [place] [address]   Preg, Serum 07/29/2023 NEGATIVE  NEGATIVE Final   Comment:        THE SENSITIVITY OF THIS METHODOLOGY IS >10 mIU/mL. Performed at [facility], [address] [place], [place] [address]    SARS  Coronavirus 2 by RT PCR 07/29/2023 NEGATIVE  NEGATIVE Final   Comment: (NOTE) SARS-CoV-2 target nucleic acids are NOT DETECTED.  The SARS-CoV-2 RNA is generally detectable in upper respiratory specimens during the acute phase of infection. The lowest concentration of SARS-CoV-2 viral copies this assay can detect is 138 copies/mL. A negative result does not preclude SARS-Cov-2 infection and should not be used as the sole basis for treatment or other patient management decisions. A negative result may occur with  improper specimen collection/handling, submission of specimen other  than nasopharyngeal swab, presence of viral mutation(s) within the areas targeted by this assay, and inadequate number of viral copies(<138 copies/mL). A negative result must be combined with clinical observations, patient history, and epidemiological information. The expected result is Negative.  Fact Sheet for Patients:  BloggerCourse.com  Fact Sheet for Healthcare Providers:  SeriousBroker.it  This test is no                          t yet approved or cleared by the [place]  FDA and  has been authorized for detection and/or diagnosis of SARS-CoV-2 by FDA under an Emergency Use Authorization (EUA). This EUA will remain  in effect (meaning this test can be used) for the duration of the COVID-19 declaration under Section 564(b)(1) of the Act, 21 U.S.C.section 360bbb-3(b)(1), unless the authorization is terminated  or revoked sooner.       Influenza A by PCR 07/29/2023 NEGATIVE  NEGATIVE Final   Influenza B by PCR 07/29/2023 NEGATIVE  NEGATIVE Final   Comment: (NOTE) The Xpert Xpress SARS-CoV-2/FLU/RSV plus assay is intended as an aid in the diagnosis of influenza from Nasopharyngeal swab specimens and should not be used as a sole basis for treatment. Nasal washings and aspirates are unacceptable for Xpert Xpress SARS-CoV-2/FLU/RSV testing.  Fact  Sheet for Patients: BloggerCourse.com  Fact Sheet for Healthcare Providers: SeriousBroker.it  This test is not yet approved or cleared by the [place]  FDA and has been authorized for detection and/or diagnosis of SARS-CoV-2 by FDA under an Emergency Use Authorization (EUA). This EUA will remain in effect (meaning this test can be used) for the duration of the COVID-19 declaration under Section 564(b)(1) of the Act, 21 U.S.C. section 360bbb-3(b)(1), unless the authorization is terminated or revoked.     Resp Syncytial Virus by PCR 07/29/2023 NEGATIVE  NEGATIVE Final   Comment: (NOTE) Fact Sheet for Patients: BloggerCourse.com  Fact Sheet for Healthcare Providers: SeriousBroker.it  This test is not yet approved or cleared by the [place]  FDA and has been authorized for detection and/or diagnosis of SARS-CoV-2 by FDA under an Emergency Use Authorization (EUA). This EUA will remain in effect (meaning this test can be used) for the duration of the COVID-19 declaration under Section 564(b)(1) of the Act, 21 U.S.C. section 360bbb-3(b)(1), unless the authorization is terminated or revoked.  Performed at [facility], [address] [place], [place] [address]    Sodium 07/30/2023 136  135 - 145 mmol/L Final   Potassium 07/30/2023 3.4 (L)  3.5 - 5.1 mmol/L Final   Chloride 07/30/2023 100  98 - 111 mmol/L Final   CO2 07/30/2023 25  22 - 32 mmol/L Final   Glucose, Bld 07/30/2023 122 (H)  70 - 99 mg/dL Final   Glucose reference range applies only to samples taken after fasting for at least 8 hours.   BUN 07/30/2023 16  6 - 20 mg/dL Final   Creatinine, Ser 07/30/2023 0.58  0.44 - 1.00 mg/dL Final   Calcium  07/30/2023 9.5  8.9 - 10.3 mg/dL Final   GFR, Estimated 07/30/2023 >60  >60 mL/min Final   Comment: (NOTE) Calculated using the CKD-EPI Creatinine Equation  (2021)    Anion gap 07/30/2023 11  5 - 15 Final   Performed at [facility], [address], [place], [place] [address]   Troponin I (High Sensitivity) 07/30/2023 <2  <18 ng/L Final   Comment: (NOTE) Elevated high sensitivity troponin I (hsTnI) values  and significant  changes across serial measurements may suggest ACS but many other  chronic and acute conditions are known to elevate hsTnI results.  Refer to the Links section for chest pain algorithms and additional  guidance. Performed at [facility], [address], [place], [place] [address]    Glucose-Capillary 07/30/2023 141 (H)  70 - 99 mg/dL Final   Glucose reference range applies only to samples taken after fasting for at least 8 hours.    Blood Alcohol level:  Lab Results  Component Value Date   ETH 21 (H) 12/02/2023    Metabolic Disorder Labs: Lab Results  Component Value Date   HGBA1C 5.6 11/14/2023   HGBA1C 5.6 11/14/2023   MPG 111 09/18/2019   No results found for: PROLACTIN Lab Results  Component Value Date   CHOL 246 (H) 11/14/2023   TRIG 91 11/14/2023   HDL 102 11/14/2023   CHOLHDL 2.4 11/14/2023   LDLCALC 129 (H) 11/14/2023   LDLCALC 104 (H) 03/27/2023    Therapeutic Lab Levels: No results found for: LITHIUM No results found for: VALPROATE No results found for: CBMZ  Physical Findings   AUDIT    Flowsheet Row ED from 12/02/2023 in [facility]  Alcohol Use Disorder Identification Test Final Score (AUDIT) 31      GAD-7    Flowsheet Row Office Visit from 11/14/2023 in [facility] at [facility] Office Visit from 06/06/2022 in [facility] Primary Care & Sports Medicine at [facility] Office Visit from 11/03/2019 in [facility] at [facility] Office Visit from 09/28/2019 in [facility] Primary Care & Sports Medicine at [facility] Office Visit from 01/26/2019 in [facility] Primary Care & Sports Medicine at [facility]  Total GAD-7 Score 10 9 0 0 5      PHQ2-9    Flowsheet Row ED from 12/02/2023 in [facility] Office Visit from 11/14/2023 in [facility] Medicine at [facility] Office Visit from 08/07/2022 in [facility] Primary Care & Sports Medicine at [facility] Office Visit from 06/06/2022 in [facility] Primary Care & Sports Medicine at [facility] Office Visit from 02/01/2022 in [facility] Primary Care & Sports Medicine at [facility]  PHQ-2 Total Score 4 5 0 4 0  PHQ-9 Total Score 11 15 -- 16 --      Flowsheet Row ED from 12/02/2023 in [facility] Most recent reading at 12/02/2023  9:44 PM ED from 12/02/2023 in [facility] Most recent reading at 12/02/2023  6:00 PM ED from 08/19/2023 in [facility] at [facility] Most recent reading at 08/19/2023  8:39 AM  C-SSRS RISK CATEGORY No Risk No Risk No Risk        Musculoskeletal  Strength & Muscle Tone: within normal limits Gait & Station: normal Patient leans: N/A  Psychiatric Specialty Exam  Presentation  General Appearance:  Casual  Eye Contact: Good  Speech: Clear and Coherent  Speech Volume: Normal  Handedness: Right   Mood and Affect  Mood: Numb  Affect: Anxious, Congruent   Thought Process  Thought Processes: Coherent  Descriptions of Associations:Intact  Orientation:Full (Time, Place and Person)  Thought Content:Logical  Diagnosis of Schizophrenia or Schizoaffective disorder in past: No    Hallucinations: None  Ideas of Reference:None  Suicidal Thoughts: None  Homicidal Thoughts: None  Sensorium  Memory: Immediate Good  Judgment: Fair  Insight: Fair   Chartered certified accountant: Fair  Attention  Span: Fair  Recall: [place] of Knowledge: Good  Language: Good   Psychomotor Activity  Psychomotor Activity: Normal    Assets  Assets: Desire for Improvement; Resilience   Sleep  Sleep: Poor    Physical Exam  Physical Exam ROS Physical Exam Constitutional:      Appearance: the patient is not toxic-appearing.  Pulmonary:     Effort: Pulmonary effort is normal.  Neurological:     General: No focal deficit present.     Mental Status: the patient is alert and oriented to person, place, and time.     Mild tremor noted on BUE  Review of Systems  Respiratory:  Negative for shortness of breath.   Cardiovascular:  Negative for chest pain.  Gastrointestinal:  Negative for abdominal pain, constipation, diarrhea, nausea and vomiting.  Neurological:  Negative for headaches.   Blood pressure 116/82, pulse 67, temperature 97.9 F (36.6 C), temperature source Oral, resp. rate 18, SpO2 98%. There is no height or weight on file to calculate BMI.  Treatment Plan Summary: Daily contact with patient to assess and evaluate symptoms and progress in treatment, Medication management, and Plan    Patient is a 38 year old female with a past psychiatric history of MDD, GAD, alcohol use disorder, no past psychiatric hospitalizations or suicide attempts who presented to the behavioral health urgent care with her daughter for alcohol detox and is interested in residential treatment.   Patient currently experiencing mild withdrawal symptoms of tremor. BP is less hypertensive today. Switched patient to symptom-triggered CIWA protocol given she continues to decline ativan taper despite education. She has severe alcohol use disorder that is impacting occupational and social functioning. Discussed starting acamprosate in the following days given hx of elevated LFTs and possibly fatty liver. She also has history of MDD and GAD which was treated with zoloft . Currently substance-induced mood  disorder given her ongoing daily alcohol use but will restart zoloft  given prior efficacy and as she is coming off of alcohol once her CMP comes back.   Status: VOL   Labs:  Alcohol level 21 on admission.  UDS negative.  TSH within normal limits.  MCV 107.5. CMP pending. EKG Qtc 479.   #Alcohol use disorder -CIWA -start sx-triggered ativan  -PRN ativan for CIWA >10  -PRN: atarax , tylenol, maalox, imodium, milk of mg, zofran -daily MV, thiamine  -consider starting acamprosate when able to obtain CMP   #Substance-induced mood disorder #History of MDD, GAD  --start zoloft  25mg  for depression and anxiety if CMP returns back wnl   --start hydroxyzine  50mg  at bedtime for insomnia   #DM  -continue home metformin    Norbert Bean, MD, PGY-2 12/04/2023 8:12 AM  This case was discussed with attending Dr. Genita Keys who agrees with the above formulated treatment plan. Please see attending attestation for additional details.   This note was created using a voice recognition software as a result there may be grammatical errors inadvertently enclosed that do not reflect the nature of this encounter. Every attempt is made to correct such errors.

## 2023-12-04 NOTE — Group Note (Signed)
 Group Topic: Change and Accountability  Group Date: 12/04/2023 Start Time: 1210 End Time: 1250 Facilitators: Erma Hay, NT  Department: [facility]  Number of Participants: 7  Group Focus: relapse prevention Treatment Modality:  Psychoeducation Interventions utilized were patient education Purpose: relapse prevention strategies  Name: Sara Simpson Date of Birth: 24-Oct-1985  MR: [id number]    Level of Participation: minimal Quality of Participation: cooperative Interactions with others: did not occur Mood/Affect: appropriate Triggers (if applicable): n/a Cognition: coherent/clear Progress: Moderate Response: n/a Plan: patient will be encouraged to explore and utilize community-based recovery resources for ongoing support.   Patients Problems:  Patient Active Problem List   Diagnosis Date Noted   Alcohol abuse 12/02/2023   Benign paroxysmal positional vertigo 03/27/2023   Screening examination for STD (sexually transmitted disease) 03/27/2023   Controlled type 2 diabetes mellitus without complication, without long-term current use of insulin (HCC) 01/31/2022   GAD (generalized anxiety disorder) 09/28/2019   Seasonal allergies 09/28/2019   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

## 2023-12-04 NOTE — ED Notes (Signed)
 Patient is sleeping. Respirations equal and unlabored. No change in assessment or acuity. Routine safety checks conducted according to facility protocol.

## 2023-12-04 NOTE — Group Note (Signed)
 Group Topic: Relapse and Recovery  Group Date: 12/04/2023 Start Time: 2000 End Time: 2030 Facilitators: Dee Farber D, NT  Department: [facility]  Number of Participants: 7  Group Focus: substance abuse education Treatment Modality:  Psychoeducation Interventions utilized were story telling Purpose: relapse prevention strategies  Name: Sara Simpson Date of Birth: 05-20-1986  MR: [id number]    Level of Participation: moderate Quality of Participation: attentive Interactions with others: gave feedback Mood/Affect: appropriate Triggers (if applicable): n/a Cognition: coherent/clear Progress: Significant Response: n/a Plan: follow-up needed  Patients Problems:  Patient Active Problem List   Diagnosis Date Noted   Alcohol abuse 12/02/2023   Benign paroxysmal positional vertigo 03/27/2023   Screening examination for STD (sexually transmitted disease) 03/27/2023   Controlled type 2 diabetes mellitus without complication, without long-term current use of insulin (HCC) 01/31/2022   GAD (generalized anxiety disorder) 09/28/2019   Seasonal allergies 09/28/2019   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

## 2023-12-04 NOTE — Discharge Planning (Signed)
 SW received call from [facility] requesting phone screen from patient and stated they do have a bed for her. SW will continue to follow and assist as needed.

## 2023-12-04 NOTE — Group Note (Signed)
 Group Topic: Change and Accountability  Group Date: 12/04/2023 Start Time: 1000 End Time: 1045 Facilitators: Ashby Blackwater, NT  Department: [facility]  Number of Participants: 6  Group Focus: activities of daily living skills and chemical dependency education Treatment Modality:  Skills Training Interventions utilized were group exercise, problem solving, and support Purpose: enhance coping skills and improve communication skills  Name: Sara Simpson Date of Birth: June 11, 1986  MR: [id number]    Level of Participation: moderate Quality of Participation: cooperative Interactions with others: gave feedback Mood/Affect: bright Triggers (if applicable): none Cognition: coherent/clear Progress: Gaining insight Response: none Plan: patient will be encouraged to try to attend more groups when discharged  Patients Problems:  Patient Active Problem List   Diagnosis Date Noted   Alcohol abuse 12/02/2023   Benign paroxysmal positional vertigo 03/27/2023   Screening examination for STD (sexually transmitted disease) 03/27/2023   Controlled type 2 diabetes mellitus without complication, without long-term current use of insulin (HCC) 01/31/2022   GAD (generalized anxiety disorder) 09/28/2019   Seasonal allergies 09/28/2019   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

## 2023-12-04 NOTE — ED Notes (Signed)
 Patient is asleep in bed at this time.  No distress. Will monitor.

## 2023-12-04 NOTE — ED Notes (Addendum)
 Pt c/o anxiety and headache rated 4/10. ETO Advil given as per orders. Pt denies SI/HI/AVH. Pt took a shower tonight. Pt also attended AA meeting/group. Pt is safe on the unit at this time.

## 2023-12-05 DIAGNOSIS — F419 Anxiety disorder, unspecified: Secondary | ICD-10-CM | POA: Diagnosis not present

## 2023-12-05 DIAGNOSIS — F339 Major depressive disorder, recurrent, unspecified: Secondary | ICD-10-CM | POA: Diagnosis not present

## 2023-12-05 DIAGNOSIS — F1994 Other psychoactive substance use, unspecified with psychoactive substance-induced mood disorder: Secondary | ICD-10-CM | POA: Diagnosis not present

## 2023-12-05 DIAGNOSIS — F1020 Alcohol dependence, uncomplicated: Secondary | ICD-10-CM | POA: Diagnosis not present

## 2023-12-05 MED ORDER — TRAZODONE HCL 50 MG PO TABS: 50.0000 mg | ORAL_TABLET | Freq: Every day | ORAL | Status: DC

## 2023-12-05 MED ORDER — IBUPROFEN 200 MG PO TABS
200.0000 mg | ORAL_TABLET | Freq: Once | ORAL | Status: AC
  Administered 2023-12-05: 200 mg via ORAL
  Filled 2023-12-05: qty 1

## 2023-12-05 MED ORDER — SERTRALINE HCL 25 MG PO TABS
25.0000 mg | ORAL_TABLET | Freq: Every day | ORAL | Status: DC
  Administered 2023-12-05 – 2023-12-06 (×2): 25 mg via ORAL
  Filled 2023-12-05 (×2): qty 1

## 2023-12-05 MED ORDER — INSULIN ASPART 100 UNIT/ML IJ SOLN: 0.0000 [IU] | Freq: Three times a day (TID) | INTRAMUSCULAR | Status: DC

## 2023-12-05 NOTE — Group Note (Signed)
 Group Topic: Healthy Self Image and Positive Change  Group Date: 12/05/2023 Start Time: 6761 End Time: 2000 Facilitators: Alvino Joseph, NT  Department: [facility]  Number of Participants: 8  Group Focus: feeling awareness/expression Treatment Modality:  Patient-Centered Therapy Interventions utilized were group exercise Purpose: express feelings  Name: Sara Simpson Date of Birth: 1985-09-28  MR: [id number]    Level of Participation: active Quality of Participation: cooperative Interactions with others: gave feedback Mood/Affect: appropriate Triggers (if applicable): n/a Cognition: coherent/clear Progress: Moderate Response: appropriate Plan: follow-up needed  Patients Problems:  Patient Active Problem List   Diagnosis Date Noted   Alcohol abuse 12/02/2023   Benign paroxysmal positional vertigo 03/27/2023   Screening examination for STD (sexually transmitted disease) 03/27/2023   Controlled type 2 diabetes mellitus without complication, without long-term current use of insulin (HCC) 01/31/2022   GAD (generalized anxiety disorder) 09/28/2019   Seasonal allergies 09/28/2019   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

## 2023-12-05 NOTE — ED Provider Notes (Addendum)
 Behavioral Health Progress Note  Date and Time: 12/05/2023 7:57 AM Name: Sara Simpson MRN:  [id number]  Chart review: Vitals stable. CIWA last 24 hours was 2, 3 (anxiety, headache). Compliant with medications. Took PRN atarax . CMP notable for AST 92, ALT 66, Tbili 1.8, Cr 0.51. SW received call from [facility] rescue mission requesting phone screen from patient, stated they do have bed for her. Attended groups.   Subjective:   Patient is a 38 year old female with a past psychiatric history of MDD, GAD, alcohol use disorder, no past psychiatric hospitalizations or suicide attempts who presented to the behavioral health urgent care with her daughter for alcohol detox and is interested in residential treatment.  Patient is seen in treatment team room. She reports poor sleep even with increased hydroxyzine . She reports that she finds it difficult to find a comfortable position to sleep. Discussed that we can try trazodone to help with sleep as well. She reports good appetite. She denies withdrawal symptoms. She denies cravings. We discussed results of her CMP. She denies side effects from starting acamprosate including GI distress. She reports her mood is still not very great. She reports continued thoughts of low self-worth, ruminating about the past and increased anxiety. We discussed will start low dose of zoloft  and continue to monitor. Reports her prior allergy to prozac was not itchiness but more so not feeling herself. We discussed her referral for the [facility] rescue mission, she reports that she is interested in a shorter-term program since this one is 4 months ago. Discussed will talk with SW about other options for her. She reports she has been talking with her family and they have been supportive.    Diagnosis:  Final diagnoses:  Recurrent major depressive disorder, remission status unspecified (HCC)  Alcohol use disorder, severe, dependence (HCC)  Anxiety disorder, unspecified type   Substance induced mood disorder (HCC)   Past Psychiatric History:  She reports past psychiatric history of MDD, GAD.  She reports that she saw a psychiatrist once for a vocational scholarship program.  She reports that she has been on Zoloft  and Prozac in the past.  She reports that Zoloft  helped for her depression and anxiety but when she was drinking it made her feel more anxiety and edgy.  She also reports being prescribed Valium  in the past for anxiety.  She denies having seen a therapist in the past.  She denies past psychiatric hospitalizations or suicide attempts.  Past Medical History:  She reports a medical history of diabetes on metformin .  Per chart review, patient also has history of hypertension and generalized anxiety disorder and possibly fatty liver disease as well as elevated LFTs.  She reports allergies to penicillin and Prozac.  She reports that Prozac made her feel crazy and she was not herself.  Family History: None reported  Social History: She reports that she lives with her 83 year old daughter and 73 year old son.  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  She denies any access to guns.  She denies any legal issues.  She reports that her children are her main support system.  Additional Social History:    Pain Medications: See MAR Prescriptions: See MAR Over the Counter: See MAR History of alcohol / drug use?: Yes Longest period of sobriety (when/how long): Unknown Negative Consequences of Use: Personal relationships, Financial Withdrawal Symptoms: Blackouts, Patient aware of relationship between substance abuse and physical/medical complications, Tremors Name of Substance 1: Alcohol 1 - Age of First Use: 7 1 -  Amount (size/oz): about 10 shots or more daily of liquor 1 - Frequency: daily 1 - Duration: ongoing 1 - Last Use / Amount: today, glass of wine/ one shot of vodka 1 - Method of Aquiring: store 1- Route of Use: oral consumption                   Current Medications:  Current Facility-Administered Medications  Medication Dose Route Frequency Provider Last Rate Last Admin   acamprosate (CAMPRAL) tablet 666 mg  666 mg Oral TID Svara Twyman, MD   666 mg at 12/04/23 2133   acetaminophen (TYLENOL) tablet 650 mg  650 mg Oral Q6H PRN Dorthea Gauze, NP   650 mg at 12/03/23 2149   alum & mag hydroxide-simeth (MAALOX/MYLANTA) 200-200-20 MG/5ML suspension 30 mL  30 mL Oral Q4H PRN Dorthea Gauze, NP       folic acid (FOLVITE) tablet 1 mg  1 mg Oral Daily Ronav Furney, MD   1 mg at 12/04/23 0920   hydrOXYzine  (ATARAX ) tablet 25 mg  25 mg Oral Q6H PRN Dorthea Gauze, NP   25 mg at 12/04/23 2133   hydrOXYzine  (ATARAX ) tablet 50 mg  50 mg Oral QHS PRN Regina Ganci, MD       loperamide (IMODIUM) capsule 2-4 mg  2-4 mg Oral PRN Dorthea Gauze, NP       LORazepam (ATIVAN) tablet 1-4 mg  1-4 mg Oral Q1H PRN Germani Gavilanes, MD       Or   LORazepam (ATIVAN) tablet 1 mg  1 mg Oral Q1H PRN Jasmyn Picha, MD       magnesium hydroxide (MILK OF MAGNESIA) suspension 30 mL  30 mL Oral Daily PRN Dorthea Gauze, NP       metFORMIN  (GLUCOPHAGE -XR) 24 hr tablet 500 mg  500 mg Oral Q breakfast Dorthea Gauze, NP   500 mg at 12/04/23 0920   multivitamin with minerals tablet 1 tablet  1 tablet Oral Daily Dorthea Gauze, NP   1 tablet at 12/04/23 0920   OLANZapine (ZYPREXA) injection 10 mg  10 mg Intramuscular TID PRN Dorthea Gauze, NP       OLANZapine (ZYPREXA) injection 5 mg  5 mg Intramuscular TID PRN Dorthea Gauze, NP       OLANZapine zydis (ZYPREXA) disintegrating tablet 5 mg  5 mg Oral TID PRN Dorthea Gauze, NP       ondansetron (ZOFRAN-ODT) disintegrating tablet 4 mg  4 mg Oral Q6H PRN Dorthea Gauze, NP       thiamine (VITAMIN B1) tablet 100 mg  100 mg Oral Daily Dorthea Gauze, NP   100 mg at 12/04/23 0920   Current Outpatient Medications  Medication Sig Dispense Refill   albuterol  (VENTOLIN  HFA) 108 (90 Base) MCG/ACT inhaler Inhale 2 puffs into  the lungs every 4 (four) hours as needed for wheezing or shortness of breath.     blood glucose meter kit and supplies KIT Dispense based on patient and insurance preference. Use up to four times daily as directed. Please include lancets, test strips, control solution. 1 each 0   Continuous Blood Gluc Sensor (FREESTYLE LIBRE 3 SENSOR) MISC 1 each by Does not apply route every 14 (fourteen) days. 6 each 1   Continuous Glucose Monitor Sup KIT 1 each by Does not apply route continuous. 1 kit 0   metFORMIN  (GLUCOPHAGE -XR) 500 MG 24 hr tablet Take 1 tablet (500 mg total) by mouth daily with breakfast. 90 tablet 3   rosuvastatin  (CRESTOR ) 5 MG  tablet Take 1 tablet (5 mg total) by mouth daily. 90 tablet 3   diazepam  (VALIUM ) 2 MG tablet Take 1 tablet (2 mg total) by mouth every 6 (six) hours as needed (Dizziness). (Patient not taking: Reported on 12/03/2023) 15 tablet 0   meclizine  (ANTIVERT ) 25 MG tablet Take 1 tablet (25 mg total) by mouth 2 (two) times daily as needed for dizziness. (Patient not taking: Reported on 12/03/2023) 90 tablet 1   promethazine  (PHENERGAN ) 25 MG tablet Take 1 tablet (25 mg total) by mouth every 8 (eight) hours as needed for nausea or vomiting. (Patient not taking: Reported on 12/03/2023) 20 tablet 0    Labs  Lab Results:  Admission on 12/02/2023  Component Date Value Ref Range Status   WBC 12/02/2023 9.1  4.0 - 10.5 K/uL Final   RBC 12/02/2023 3.74 (L)  3.87 - 5.11 MIL/uL Final   Hemoglobin 12/02/2023 14.6  12.0 - 15.0 g/dL Final   HCT 78/46/9629 40.2  36.0 - 46.0 % Final   MCV 12/02/2023 107.5 (H)  80.0 - 100.0 fL Final   MCH 12/02/2023 39.0 (H)  26.0 - 34.0 pg Final   MCHC 12/02/2023 36.3 (H)  30.0 - 36.0 g/dL Final   RDW 52/84/1324 11.9  11.5 - 15.5 % Final   Platelets 12/02/2023 212  150 - 400 K/uL Final   nRBC 12/02/2023 0.0  0.0 - 0.2 % Final   Neutrophils Relative % 12/02/2023 54  % Final   Neutro Abs 12/02/2023 4.8  1.7 - 7.7 K/uL Final   Lymphocytes Relative  12/02/2023 38  % Final   Lymphs Abs 12/02/2023 3.5  0.7 - 4.0 K/uL Final   Monocytes Relative 12/02/2023 6  % Final   Monocytes Absolute 12/02/2023 0.6  0.1 - 1.0 K/uL Final   Eosinophils Relative 12/02/2023 1  % Final   Eosinophils Absolute 12/02/2023 0.1  0.0 - 0.5 K/uL Final   Basophils Relative 12/02/2023 1  % Final   Basophils Absolute 12/02/2023 0.1  0.0 - 0.1 K/uL Final   Immature Granulocytes 12/02/2023 0  % Final   Abs Immature Granulocytes 12/02/2023 0.02  0.00 - 0.07 K/uL Final   Performed at [facility], [address] [place], [place] [address]   Sodium 12/04/2023 138  135 - 145 mmol/L Final   Potassium 12/04/2023 3.7  3.5 - 5.1 mmol/L Final   Chloride 12/04/2023 102  98 - 111 mmol/L Final   CO2 12/04/2023 24  22 - 32 mmol/L Final   Glucose, Bld 12/04/2023 136 (H)  70 - 99 mg/dL Final   Glucose reference range applies only to samples taken after fasting for at least 8 hours.   BUN 12/04/2023 7  6 - 20 mg/dL Final   Creatinine, Ser 12/04/2023 0.51  0.44 - 1.00 mg/dL Final   Calcium  12/04/2023 9.7  8.9 - 10.3 mg/dL Final   Total Protein 72/53/6644 8.0  6.5 - 8.1 g/dL Final   Albumin 03/47/4259 3.4 (L)  3.5 - 5.0 g/dL Final   AST 56/38/7564 92 (H)  15 - 41 U/L Final   ALT 12/04/2023 66 (H)  0 - 44 U/L Final   Alkaline Phosphatase 12/04/2023 85  38 - 126 U/L Final   Total Bilirubin 12/04/2023 1.8 (H)  0.0 - 1.2 mg/dL Final   GFR, Estimated 12/04/2023 >60  >60 mL/min Final   Comment: (NOTE) Calculated using the CKD-EPI Creatinine Equation (2021)    Anion gap 12/04/2023 12  5 - 15 Final  Performed at [facility], [address] [place], [place] [address]   Alcohol, Ethyl (B) 12/02/2023 21 (H)  <15 mg/dL Final   Comment: (NOTE) For medical purposes only. Performed at [facility], [address], [place], [place] [address]    TSH 12/02/2023 3.227  0.350 - 4.500 uIU/mL Final   Comment: Performed by a 3rd Generation assay with a functional sensitivity  of <=0.01 uIU/mL. Performed at [facility], [address] [place], [place] [address]    Preg Test, Ur 12/02/2023 Negative  Negative Final   POC Amphetamine UR 12/02/2023 None Detected  NONE DETECTED (Cut Off Level 1000 ng/mL) Final   POC Secobarbital (BAR) 12/02/2023 None Detected  NONE DETECTED (Cut Off Level 300 ng/mL) Final   POC Buprenorphine (BUP) 12/02/2023 None Detected  NONE DETECTED (Cut Off Level 10 ng/mL) Final   POC Oxazepam (BZO) 12/02/2023 None Detected  NONE DETECTED (Cut Off Level 300 ng/mL) Final   POC Cocaine UR 12/02/2023 None Detected  NONE DETECTED (Cut Off Level 300 ng/mL) Final   POC Methamphetamine UR 12/02/2023 None Detected  NONE DETECTED (Cut Off Level 1000 ng/mL) Final   POC Morphine 12/02/2023 None Detected  NONE DETECTED (Cut Off Level 300 ng/mL) Final   POC Methadone UR 12/02/2023 None Detected  NONE DETECTED (Cut Off Level 300 ng/mL) Final   POC Oxycodone UR 12/02/2023 None Detected  NONE DETECTED (Cut Off Level 100 ng/mL) Final   POC Marijuana UR 12/02/2023 None Detected  NONE DETECTED (Cut Off Level 50 ng/mL) Final  Office Visit on 11/14/2023  Component Date Value Ref Range Status   Hemoglobin A1C 11/14/2023 5.6  4.0 - 5.6 % Final   HbA1c, POC (controlled diabetic ra* 11/14/2023 5.6  0.0 - 7.0 % Final   Microalbumin Ur, POC 11/14/2023 30  mg/L Final   Creatinine, POC 11/14/2023 100  mg/dL Final   Albumin/Creatinine Ratio, Urine, P* 11/14/2023 <30   Final   Hepatitis B Surface Ag 11/14/2023 Negative  Negative Final   Hep B Surface Ab, Qual 11/14/2023 Non Reactive   Final   Comment:              Non Reactive: Not immune to HBV infection.              Equivocal: Unable to determine if anti-HBs                         is present at levels consistent                         with immunity.               Reactive: Anti-HBs concentration detected                         at greater than 10 mIU/mL.                         Individual is considered to be                          immune to infection with HBV.    Hep B Core Total Ab 11/14/2023 Negative  Negative Final   Rfx to HBc IgM 11/14/2023 Comment   Final   Reflex criteria was not met.   Interpretation 11/14/2023 Comment  Final   Comment:                    HBV Serology Interpretation Chart  -------------------------------------------------------------------  Interpretation             HBsAg  anti-HBs  anti-HBc  anti-HBc IgM  -------------------------------------------------------------------  Key - Analyte present: + Analyte absent: - Test not indicated: TNI  -------------------------------------------------------------------  Susceptible (never  infected and no evidence    -        -         -          TNI  of vaccination)  -------------------------------------------------------------------  Immune due to natural  resolved infection          -        +         +          TNI  -------------------------------------------------------------------  Immune due to vaccination   -        +         -          TNI  -------------------------------------------------------------------  Acute Infection             +        -         +           +  -------------------------------------------------------------------                            Chronic infection           +        -         +           -  -------------------------------------------------------------------  Interpretation unclear*     -        -         +          +/-  -------------------------------------------------------------------  *Multiple possibilities: resolved infection (most common); false-   positive anti-HBc (susceptible); low-level chronic infection;   resolving acute infection.    HCV Ab 11/14/2023 Non Reactive  Non Reactive Final   RPR Ser Ql 11/14/2023 Non Reactive  Non Reactive Final   HIV Screen 4th Generation wRfx 11/14/2023 Non Reactive  Non Reactive Final   Comment: HIV-1/HIV-2 antibodies and HIV-1 p24  antigen were NOT detected. There is no laboratory evidence of HIV infection. HIV Negative    Chlamydia by NAA 11/14/2023 Negative  Negative Final   Gonococcus by NAA 11/14/2023 Negative  Negative Final   Trich vag by NAA 11/14/2023 Negative  Negative Final   WBC 11/14/2023 7.4  3.4 - 10.8 x10E3/uL Final   RBC 11/14/2023 3.83  3.77 - 5.28 x10E6/uL Final   Hemoglobin 11/14/2023 15.0  11.1 - 15.9 g/dL Final   Hematocrit 08/65/7846 42.1  34.0 - 46.6 % Final   MCV 11/14/2023 110 (H)  79 - 97 fL Final   MCH 11/14/2023 39.2 (H)  26.6 - 33.0 pg Final   MCHC 11/14/2023 35.6  31.5 - 35.7 g/dL Final   RDW 96/29/5284 12.6  11.7 - 15.4 % Final   Platelets 11/14/2023 204  150 - 450 x10E3/uL Final   Neutrophils 11/14/2023 48  Not Estab. % Final   Lymphs 11/14/2023 41  Not Estab. % Final   Monocytes 11/14/2023 7  Not Estab. % Final   Eos 11/14/2023 3  Not Estab. % Final   Basos 11/14/2023 1  Not Estab. % Final   Neutrophils Absolute 11/14/2023 3.5  1.4 - 7.0 x10E3/uL Final   Lymphocytes Absolute 11/14/2023 3.0  0.7 - 3.1 x10E3/uL Final   Monocytes Absolute 11/14/2023 0.5  0.1 - 0.9 x10E3/uL Final   EOS (ABSOLUTE) 11/14/2023 0.2  0.0 - 0.4 x10E3/uL Final   Basophils Absolute 11/14/2023 0.1  0.0 - 0.2 x10E3/uL Final   Immature Granulocytes 11/14/2023 0  Not Estab. % Final   Immature Grans (Abs) 11/14/2023 0.0  0.0 - 0.1 x10E3/uL Final   Glucose 11/14/2023 140 (H)  70 - 99 mg/dL Final   BUN 40/98/1191 10  6 - 20 mg/dL Final   Creatinine, Ser 11/14/2023 0.48 (L)  0.57 - 1.00 mg/dL Final   eGFR 47/82/9562 123  >59 mL/min/1.73 Final   BUN/Creatinine Ratio 11/14/2023 21  9 - 23 Final   Sodium 11/14/2023 137  134 - 144 mmol/L Final   Potassium 11/14/2023 4.6  3.5 - 5.2 mmol/L Final   Chloride 11/14/2023 99  96 - 106 mmol/L Final   CO2 11/14/2023 24  20 - 29 mmol/L Final   Calcium  11/14/2023 9.7  8.7 - 10.2 mg/dL Final   Total Protein 13/01/6577 8.2  6.0 - 8.5 g/dL Final   Albumin 46/96/2952 4.2  3.9 -  4.9 g/dL Final   Globulin, Total 11/14/2023 4.0  1.5 - 4.5 g/dL Final   Bilirubin Total 11/14/2023 1.4 (H)  0.0 - 1.2 mg/dL Final   Alkaline Phosphatase 11/14/2023 88  44 - 121 IU/L Final   AST 11/14/2023 86 (H)  0 - 40 IU/L Final   ALT 11/14/2023 70 (H)  0 - 32 IU/L Final   Cholesterol, Total 11/14/2023 246 (H)  100 - 199 mg/dL Final   Triglycerides 84/13/2440 91  0 - 149 mg/dL Final   HDL 04/21/2535 102  >39 mg/dL Final   VLDL Cholesterol Cal 11/14/2023 15  5 - 40 mg/dL Final   LDL Chol Calc (NIH) 11/14/2023 129 (H)  0 - 99 mg/dL Final   Chol/HDL Ratio 11/14/2023 2.4  0.0 - 4.4 ratio Final   Comment:                                   T. Chol/HDL Ratio                                             Men  Women                               1/2 Avg.Risk  3.4    3.3                                   Avg.Risk  5.0    4.4                                2X Avg.Risk  9.6    7.1  3X Avg.Risk 23.4   11.0    Ethanol 11/14/2023 <.010  Cutoff=0.010 % Final   TSH 11/14/2023 2.710  0.450 - 4.500 uIU/mL Final   Trichomonas Exam 11/14/2023 Negative  Negative Final   Yeast Exam 11/14/2023 Negative  Negative Final   Clue Cell Exam 11/14/2023 Positive (A)  Negative Final   Color, UA 11/14/2023 yellow  yellow Final   Clarity, UA 11/14/2023 clear  clear Final   Glucose, UA 11/14/2023 negative  negative mg/dL Final   Bilirubin, UA 54/02/8118 negative  negative Final   Ketones, POC UA 11/14/2023 negative  negative mg/dL Final   Spec Grav, UA 14/78/2956 1.015  1.010 - 1.025 Final   Blood, UA 11/14/2023 negative  negative Final   pH, UA 11/14/2023 7.5  5.0 - 8.0 Final   POC PROTEIN,UA 11/14/2023 negative  negative, trace Final   Urobilinogen, UA 11/14/2023 4.0 (A)  0.2 or 1.0 E.U./dL Final   Nitrite, UA 21/30/8657 Negative  Negative Final   Leukocytes, UA 11/14/2023 Negative  Negative Final   Urine Culture, Routine 11/14/2023 Final report   Final   Organism ID, Bacteria  11/14/2023 Comment   Final   Comment: Mixed urogenital flora Less than 10,000 colonies/mL    HCV Interp 1: 11/14/2023 Comment   Final   Comment: Not infected with HCV unless early or acute infection is suspected (which may be delayed in an immunocompromised individual), or other evidence exists to indicate HCV infection.   Admission on 08/19/2023, Discharged on 08/19/2023  Component Date Value Ref Range Status   Sodium 08/19/2023 137  135 - 145 mmol/L Final   Potassium 08/19/2023 3.5  3.5 - 5.1 mmol/L Final   Chloride 08/19/2023 105  98 - 111 mmol/L Final   CO2 08/19/2023 21 (L)  22 - 32 mmol/L Final   Glucose, Bld 08/19/2023 198 (H)  70 - 99 mg/dL Final   Glucose reference range applies only to samples taken after fasting for at least 8 hours.   BUN 08/19/2023 13  6 - 20 mg/dL Final   Creatinine, Ser 08/19/2023 0.44  0.44 - 1.00 mg/dL Final   Calcium  08/19/2023 9.0  8.9 - 10.3 mg/dL Final   GFR, Estimated 08/19/2023 >60  >60 mL/min Final   Comment: (NOTE) Calculated using the CKD-EPI Creatinine Equation (2021)    Anion gap 08/19/2023 11  5 - 15 Final   Performed at [facility], [address], [place], [place] [address]   WBC 08/19/2023 5.5  4.0 - 10.5 K/uL Final   RBC 08/19/2023 3.57 (L)  3.87 - 5.11 MIL/uL Final   Hemoglobin 08/19/2023 13.8  12.0 - 15.0 g/dL Final   HCT 29/52/8413 39.7  36.0 - 46.0 % Final   MCV 08/19/2023 111.2 (H)  80.0 - 100.0 fL Final   MCH 08/19/2023 38.7 (H)  26.0 - 34.0 pg Final   MCHC 08/19/2023 34.8  30.0 - 36.0 g/dL Final   RDW 24/40/1027 12.7  11.5 - 15.5 % Final   Platelets 08/19/2023 183  150 - 400 K/uL Final   nRBC 08/19/2023 0.0  0.0 - 0.2 % Final   Performed at [facility], [address] [place], [place] [address]   Preg, Serum 08/19/2023 NEGATIVE  NEGATIVE Final   Comment:        THE SENSITIVITY OF THIS METHODOLOGY IS >10 mIU/mL. Performed at [facility], [address],  [place], [place] [address]   Admission on 07/29/2023, Discharged on 07/30/2023  Component Date Value Ref Range Status   WBC 07/29/2023 5.2  4.0 - 10.5 K/uL Final   RBC 07/29/2023 3.59 (L)  3.87 - 5.11 MIL/uL Final   Hemoglobin 07/29/2023 14.1  12.0 - 15.0 g/dL Final   HCT 40/98/1191 40.5  36.0 - 46.0 % Final   MCV 07/29/2023 112.8 (H)  80.0 - 100.0 fL Final   MCH 07/29/2023 39.3 (H)  26.0 - 34.0 pg Final   MCHC 07/29/2023 34.8  30.0 - 36.0 g/dL Final   RDW 47/82/9562 13.1  11.5 - 15.5 % Final   Platelets 07/29/2023 171  150 - 400 K/uL Final   nRBC 07/29/2023 0.0  0.0 - 0.2 % Final   Performed at [facility], [address], [place], [place] [address]   Preg, Serum 07/29/2023 NEGATIVE  NEGATIVE Final   Comment:        THE SENSITIVITY OF THIS METHODOLOGY IS >10 mIU/mL. Performed at [facility], [address], [place], [place] [address]    SARS Coronavirus 2 by RT PCR 07/29/2023 NEGATIVE  NEGATIVE Final   Comment: (NOTE) SARS-CoV-2 target nucleic acids are NOT DETECTED.  The SARS-CoV-2 RNA is generally detectable in upper respiratory specimens during the acute phase of infection. The lowest concentration of SARS-CoV-2 viral copies this assay can detect is 138 copies/mL. A negative result does not preclude SARS-Cov-2 infection and should not be used as the sole basis for treatment or other patient management decisions. A negative result may occur with  improper specimen collection/handling, submission of specimen other than nasopharyngeal swab, presence of viral mutation(s) within the areas targeted by this assay, and inadequate number of viral copies(<138 copies/mL). A negative result must be combined with clinical observations, patient history, and epidemiological information. The expected result is Negative.  Fact Sheet for Patients:  BloggerCourse.com  Fact Sheet for Healthcare Providers:   SeriousBroker.it  This test is no                          t yet approved or cleared by the [place]  FDA and  has been authorized for detection and/or diagnosis of SARS-CoV-2 by FDA under an Emergency Use Authorization (EUA). This EUA will remain  in effect (meaning this test can be used) for the duration of the COVID-19 declaration under Section 564(b)(1) of the Act, 21 U.S.C.section 360bbb-3(b)(1), unless the authorization is terminated  or revoked sooner.       Influenza A by PCR 07/29/2023 NEGATIVE  NEGATIVE Final   Influenza B by PCR 07/29/2023 NEGATIVE  NEGATIVE Final   Comment: (NOTE) The Xpert Xpress SARS-CoV-2/FLU/RSV plus assay is intended as an aid in the diagnosis of influenza from Nasopharyngeal swab specimens and should not be used as a sole basis for treatment. Nasal washings and aspirates are unacceptable for Xpert Xpress SARS-CoV-2/FLU/RSV testing.  Fact Sheet for Patients: BloggerCourse.com  Fact Sheet for Healthcare Providers: SeriousBroker.it  This test is not yet approved or cleared by the [place]  FDA and has been authorized for detection and/or diagnosis of SARS-CoV-2 by FDA under an Emergency Use Authorization (EUA). This EUA will remain in effect (meaning this test can be used) for the duration of the COVID-19 declaration under Section 564(b)(1) of the Act, 21 U.S.C. section 360bbb-3(b)(1), unless the authorization is terminated or revoked.     Resp Syncytial Virus by PCR 07/29/2023 NEGATIVE  NEGATIVE Final   Comment: (NOTE) Fact Sheet for Patients: BloggerCourse.com  Fact Sheet  for Healthcare Providers: SeriousBroker.it  This test is not yet approved or cleared by the [place]  FDA and has been authorized for detection and/or diagnosis of SARS-CoV-2 by FDA under an Emergency Use Authorization (EUA). This EUA  will remain in effect (meaning this test can be used) for the duration of the COVID-19 declaration under Section 564(b)(1) of the Act, 21 U.S.C. section 360bbb-3(b)(1), unless the authorization is terminated or revoked.  Performed at [facility], [address], [place], [place] [address]    Sodium 07/30/2023 136  135 - 145 mmol/L Final   Potassium 07/30/2023 3.4 (L)  3.5 - 5.1 mmol/L Final   Chloride 07/30/2023 100  98 - 111 mmol/L Final   CO2 07/30/2023 25  22 - 32 mmol/L Final   Glucose, Bld 07/30/2023 122 (H)  70 - 99 mg/dL Final   Glucose reference range applies only to samples taken after fasting for at least 8 hours.   BUN 07/30/2023 16  6 - 20 mg/dL Final   Creatinine, Ser 07/30/2023 0.58  0.44 - 1.00 mg/dL Final   Calcium  07/30/2023 9.5  8.9 - 10.3 mg/dL Final   GFR, Estimated 07/30/2023 >60  >60 mL/min Final   Comment: (NOTE) Calculated using the CKD-EPI Creatinine Equation (2021)    Anion gap 07/30/2023 11  5 - 15 Final   Performed at [facility] [facility], [address], [place], [place] [address]   Troponin I (High Sensitivity) 07/30/2023 <2  <18 ng/L Final   Comment: (NOTE) Elevated high sensitivity troponin I (hsTnI) values and significant  changes across serial measurements may suggest ACS but many other  chronic and acute conditions are known to elevate hsTnI results.  Refer to the Links section for chest pain algorithms and additional  guidance. Performed at [facility], [address], [place], [place] [address]    Glucose-Capillary 07/30/2023 141 (H)  70 - 99 mg/dL Final   Glucose reference range applies only to samples taken after fasting for at least 8 hours.    Blood Alcohol level:  Lab Results  Component Value Date   ETH 21 (H) 12/02/2023    Metabolic Disorder Labs: Lab Results  Component Value Date   HGBA1C 5.6 11/14/2023   HGBA1C 5.6 11/14/2023   MPG 111 09/18/2019   No results found for:  PROLACTIN Lab Results  Component Value Date   CHOL 246 (H) 11/14/2023   TRIG 91 11/14/2023   HDL 102 11/14/2023   CHOLHDL 2.4 11/14/2023   LDLCALC 129 (H) 11/14/2023   LDLCALC 104 (H) 03/27/2023    Therapeutic Lab Levels: No results found for: LITHIUM No results found for: VALPROATE No results found for: CBMZ  Physical Findings   AUDIT    Flowsheet Row ED from 12/02/2023 in [facility]  Alcohol Use Disorder Identification Test Final Score (AUDIT) 31   GAD-7    Flowsheet Row Office Visit from 11/14/2023 in [facility] Sports Medicine at [facility] Office Visit from 06/06/2022 in [facility] Primary Care & Sports Medicine at [facility] Office Visit from 11/03/2019 in [facility] Primary Care & Sports Medicine at [facility] Office Visit from 09/28/2019 in [facility] Primary Care & Sports Medicine at [facility] Office Visit from 01/26/2019 in [facility] Primary Care & Sports Medicine at [facility]  Total GAD-7 Score 10 9 0 0 5   PHQ2-9    Flowsheet Row ED from 12/02/2023 in [facility] Office Visit from 11/14/2023 in [facility] Primary Care & Sports Medicine at [facility] Office Visit from 08/07/2022 in [facility] Primary Care & Sports Medicine at [facility] Office Visit from 06/06/2022 in [facility] Primary Care & Sports Medicine at [facility] Office Visit from 02/01/2022 in [facility] Primary Care & Sports Medicine at [facility] Total Score 4 5 0 4 0  PHQ-9 Total Score 11 15 -- 16 --   Flowsheet Row ED from 12/02/2023 in [facility] Most recent reading at 12/02/2023  9:44 PM ED from 12/02/2023 in [facility] Most recent reading at 12/02/2023  6:00 PM ED from 08/19/2023 in [facility] Emergency Department at [facility] Most recent reading  at 08/19/2023  8:39 AM  C-SSRS RISK CATEGORY No Risk No Risk No Risk     Musculoskeletal  Strength & Muscle Tone: within normal limits Gait & Station: normal Patient leans: N/A  Psychiatric Specialty Exam  Presentation  General Appearance:  Casual  Eye Contact: Good  Speech: Clear and Coherent  Speech Volume: Normal  Handedness: Right   Mood and Affect  Mood: Not very great  Affect: Anxious, Congruent   Thought Process  Thought Processes: Coherent  Descriptions of Associations:Intact  Orientation:Full (Time, Place and Person)  Thought Content:Logical  Diagnosis of Schizophrenia or Schizoaffective disorder in past: No    Hallucinations: None  Ideas of Reference:None  Suicidal Thoughts: None  Homicidal Thoughts: None   Sensorium  Memory: Immediate Good  Judgment: Fair  Insight: Fair   Art therapist  Concentration: Fair  Attention Span: Fair  Recall: Good  Fund of Knowledge: Good  Language: Good   Psychomotor Activity  Psychomotor Activity: Normal    Assets  Assets: Desire for Improvement; Resilience   Sleep  Sleep: Poor    Physical Exam  Physical Exam ROS Physical Exam Constitutional:      Appearance: the patient is not toxic-appearing.  Pulmonary:     Effort: Pulmonary effort is normal.  Neurological:     General: No focal deficit present.     Mental Status: the patient is alert and oriented to person, place, and time.     Mild tremor noted on BUE  Review of Systems  Respiratory:  Negative for shortness of breath.   Cardiovascular:  Negative for chest pain.  Gastrointestinal:  Negative for abdominal pain, constipation, diarrhea, nausea and vomiting.  Neurological:  Negative for headaches.   Blood pressure 125/86, pulse 99, temperature 98.8 F (37.1 C), temperature source Oral, resp. rate 19, SpO2 95%. There is no height or weight on file to calculate BMI.  Treatment Plan Summary: Daily  contact with patient to assess and evaluate symptoms and progress in treatment, Medication management, and Plan    Patient is a 38 year old female with a past psychiatric history of MDD, GAD, alcohol use disorder, no past psychiatric hospitalizations or suicide attempts who presented to the behavioral health urgent care with her daughter for alcohol detox and is interested in residential treatment.   Patient currently experiencing mild withdrawal symptoms of tremor. BP stable. Switched patient to symptom-triggered CIWA protocol given she continues to decline ativan taper despite education. She has severe alcohol use disorder that is impacting occupational and social functioning. Started acamprosate without side effects. She also has history of MDD and GAD which was treated with zoloft . Currently substance-induced mood disorder given her ongoing daily alcohol use but will restart zoloft  given prior efficacy  and as she is coming off of alcohol once her CMP comes back.   Status: VOL   Labs:  Alcohol level 21 on admission.  UDS negative.  TSH within normal limits.  MCV 107.5. CMP AST 92, ALT 66, Tbili 1.8, Cr 0.51. EKG Qtc 479.   #Alcohol use disorder -CIWA -start sx-triggered ativan  -PRN ativan for CIWA >10  -PRN: atarax , tylenol, maalox, imodium, milk of mg, zofran -daily MV, thiamine  -continue acamprosate 666 TID for alcohol use disorder  #Substance-induced mood disorder #History of MDD, GAD  --start zoloft  25mg  for depression and anxiety  --start hydroxyzine  50mg  at bedtime for insomnia   #DM  -continue home metformin    Norbert Bean, MD, PGY-2 12/05/2023 7:57 AM  This case was discussed with attending Dr. Genita Keys who agrees with the above formulated treatment plan. Please see attending attestation for additional details.   This note was created using a voice recognition software as a result there may be grammatical errors inadvertently enclosed that do not reflect the nature of this  encounter. Every attempt is made to correct such errors.

## 2023-12-05 NOTE — Discharge Planning (Signed)
 SW received call from [place] at [facility] and she has 2 female beds open and patient is approved for admission on Monday 6/16 and will need to arrive by 9AM.

## 2023-12-05 NOTE — ED Notes (Signed)
 Patient resting quietly in bed with eyes closed with unlabored breathing. Q 15 minute safety checks remain in place.  Pt remains safe on the unit at this time.

## 2023-12-05 NOTE — ED Notes (Signed)
 Patient is in the bedroom sleeping. NAD Environment secured. Will continue to monitor for safety

## 2023-12-05 NOTE — Group Note (Signed)
 Group Topic: Healthy Self Image and Positive Change  Group Date: 12/05/2023 Start Time: 1220 End Time: 1330 Facilitators: Dennis Fitting, NT  Department: [facility]  Number of Participants: 8  Group Focus: clarity of thought, daily focus, problem solving, reality orientation, and self-awareness Treatment Modality:  Psychoeducation Interventions utilized were exploration, group exercise, problem solving, and story telling Purpose: explore maladaptive thinking, express feelings, and increase insight  Name: Sara Simpson Date of Birth: 23-Mar-1986  MR: [id number]    Level of Participation: minimal Quality of Participation: quiet Interactions with others: PT was quiet throughout the group and did not interact with others. Mood/Affect: closed / guarded Triggers (if applicable): N/A Cognition: no insight Progress: Gaining insight Response: PT sat at the table during group and was silent.  Plan: patient will be encouraged to attend group and communicate needs to team  Patients Problems:  Patient Active Problem List   Diagnosis Date Noted   Alcohol abuse 12/02/2023   Benign paroxysmal positional vertigo 03/27/2023   Screening examination for STD (sexually transmitted disease) 03/27/2023   Controlled type 2 diabetes mellitus without complication, without long-term current use of insulin (HCC) 01/31/2022   GAD (generalized anxiety disorder) 09/28/2019   Seasonal allergies 09/28/2019   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

## 2023-12-05 NOTE — ED Notes (Signed)
 Patient is bed sleeping with eyes clothes, NAD.  Respirations are even and unlabored.Will monitor for safety.

## 2023-12-05 NOTE — Group Note (Signed)
 Group Topic: Relaxation  Group Date: 12/05/2023 Start Time: 1330 End Time: 1350 Facilitators: Arlan Belling, RN  Department: [facility]  Number of Participants: 8  Group Focus: social skills Treatment Modality:  Behavior Modification Therapy Interventions utilized were leisure development Purpose: increase insight, regain self-worth, and reinforce self-care  Name: Sara Simpson Date of Birth: Jul 05, 1985  MR: [id number]    Level of Participation: did not participate Quality of Participation:  Interactions with others:  Mood/Affect:  Triggers (if applicable):  Cognition:  Progress:  Response:  Plan:   Patients Problems:  Patient Active Problem List   Diagnosis Date Noted   Alcohol abuse 12/02/2023   Benign paroxysmal positional vertigo 03/27/2023   Screening examination for STD (sexually transmitted disease) 03/27/2023   Controlled type 2 diabetes mellitus without complication, without long-term current use of insulin (HCC) 01/31/2022   GAD (generalized anxiety disorder) 09/28/2019   Seasonal allergies 09/28/2019   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

## 2023-12-05 NOTE — ED Notes (Signed)
 Patient asleep in bed at this time without issue or complaint.  No distress or withdrawal.  Will monitor.

## 2023-12-06 DIAGNOSIS — F1994 Other psychoactive substance use, unspecified with psychoactive substance-induced mood disorder: Secondary | ICD-10-CM | POA: Diagnosis not present

## 2023-12-06 DIAGNOSIS — F1020 Alcohol dependence, uncomplicated: Secondary | ICD-10-CM | POA: Diagnosis not present

## 2023-12-06 DIAGNOSIS — F419 Anxiety disorder, unspecified: Secondary | ICD-10-CM | POA: Diagnosis not present

## 2023-12-06 DIAGNOSIS — F339 Major depressive disorder, recurrent, unspecified: Secondary | ICD-10-CM | POA: Diagnosis not present

## 2023-12-06 LAB — GLUCOSE, CAPILLARY: Glucose-Capillary: 135 mg/dL — ABNORMAL HIGH (ref 70–99)

## 2023-12-06 MED ORDER — TRAZODONE HCL 50 MG PO TABS
50.0000 mg | ORAL_TABLET | Freq: Every evening | ORAL | Status: DC | PRN
  Filled 2023-12-06: qty 7

## 2023-12-06 MED ORDER — SERTRALINE HCL 50 MG PO TABS
50.0000 mg | ORAL_TABLET | Freq: Every day | ORAL | Status: DC
  Administered 2023-12-07 – 2023-12-09 (×3): 50 mg via ORAL
  Filled 2023-12-06: qty 7
  Filled 2023-12-06 (×3): qty 1

## 2023-12-06 MED ORDER — IBUPROFEN 600 MG PO TABS
600.0000 mg | ORAL_TABLET | Freq: Once | ORAL | Status: AC
  Administered 2023-12-06: 600 mg via ORAL
  Filled 2023-12-06: qty 1

## 2023-12-06 NOTE — ED Notes (Signed)
 Pt eating lunch at this time. NAD. Will continue to monitor.

## 2023-12-06 NOTE — Discharge Planning (Signed)
 SW received call back from [place] at [facility] and patient has been approved with bed for Monday 6/16. Patient will be discharging to [facility] on Monday 6/16 by 8:30 AM pick up for 9:00 admission with transportation provided via [employer]. Address is [address] [place], [place] [address]. Number to call for emergency is [phone]  She will need 7 day supply of medications with a 30 day script as well.

## 2023-12-06 NOTE — ED Notes (Signed)
 Pt is in the dayroom watching TV with peers. Pt denies SI/HI/AVH. Pt reports having a lot of family issues whiles she is here, and these are stressing her up. Pt has no further complain.No acute distress noted.

## 2023-12-06 NOTE — ED Notes (Signed)
 Pt A&Ox4, calm & cooperative and in NAD at this time. Denies SI/HI/AVH. Contracts for safety. Encouragement and support given. Will continue to monitor.

## 2023-12-06 NOTE — ED Notes (Signed)
 Pt in group at this time. Will continue to monitor.

## 2023-12-06 NOTE — ED Notes (Signed)
CBG 135  

## 2023-12-06 NOTE — ED Notes (Signed)
 Patient is in the bedroom sleeping with eyes closed. Respirations even and unlabored. NAD Environment secured. Will continue to monitor for safety

## 2023-12-06 NOTE — [facility] Group Notes (Signed)
 SPIRITUALITY GROUP NOTE  Spirituality group facilitated by Chaplain Aalaya Yadao, MDiv, B[facility].  Group Description: Group focused on topic of hope. Patients participated in facilitated discussion around topic, connecting with one another around experiences and definitions for hope. Group members engaged with visual explorer photos, reflecting on what hope looks like for them today. Group engaged in discussion around how their definitions of hope are present today in hospital.  Modalities: Psycho-social ed, Adlerian, Narrative, MI  Patient Progress: Arrived late to group.  Attentive throughout.  Engaged with facilitator when prompted.  Responses on topic.  Affect and orientation appropriate to group.

## 2023-12-06 NOTE — ED Provider Notes (Addendum)
 Behavioral Health Progress Note  Date and Time: 12/06/2023 10:09 AM Name: Sara Simpson Skylar Flynt MRN:  [id number]  Chart review: Vitals stable. Compliant with medications besides trazodone . Took PRN atarax . SW reports patient has been approved to go to [facility] by 9am on Monday. Attended groups. No acute events overnight.   Subjective:   Patient is a 38 year old female with a past psychiatric history of MDD, GAD, alcohol use disorder, no past psychiatric hospitalizations or suicide attempts who presented to the behavioral health urgent care with her daughter for alcohol detox and is interested in residential treatment.  Patient reports her sleep was about the same, reports that she was hesitant to take trazodone  since she had not taken it before and she is worried about being zonked out. Provided education regarding medication and also told her we could change to PRN. Reports that she feels like part of her insomnia is due to difficulty getting comfortable. She denies issues with appetite. Denies withdrawal symptoms or cravings. Asks about fasting CBG in the AM and assured her that order was placed. She reports her DM is controlled with metformin . She reports her mood is about the same. States that she still feels like an empty shell. She does note that she feels less anxious compared to prior to admission. She reports she talked with her immediate family and the conversations have gone well. She denies adverse effects from medications. Discussed plan to increase zoloft  to 50mg  today. Discussed with her that she had been accepted to [facility] on Monday, she expressed excitement for this plan. Discussed that SW will come by and talk with her to provide further information.  Diagnosis:  Final diagnoses:  Recurrent major depressive disorder, remission status unspecified (HCC)  Alcohol use disorder, severe, dependence (HCC)  Anxiety disorder, unspecified type  Substance induced mood disorder  (HCC)   Past Psychiatric History:  She reports past psychiatric history of MDD, GAD.  She reports that she saw a psychiatrist once for a vocational scholarship program.  She reports that she has been on Zoloft  and Prozac in the past.  She reports that Zoloft  helped for her depression and anxiety but when she was drinking it made her feel more anxiety and edgy.  She also reports being prescribed Valium  in the past for anxiety.  She denies having seen a therapist in the past.  She denies past psychiatric hospitalizations or suicide attempts.  Past Medical History:  She reports a medical history of diabetes on metformin .  Per chart review, patient also has history of hypertension and generalized anxiety disorder and possibly fatty liver disease as well as elevated LFTs.  She reports allergies to penicillin and Prozac.  She reports that Prozac made her feel crazy and she was not herself.  Family History: None reported  Social History: She reports that she lives with her 49 year old daughter and 65 year old son.  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  She denies any access to guns.  She denies any legal issues.  She reports that her children are her main support system.  Additional Social History:    Pain Medications: See MAR Prescriptions: See MAR Over the Counter: See MAR History of alcohol / drug use?: Yes Longest period of sobriety (when/how long): Unknown Negative Consequences of Use: Personal relationships, Financial Withdrawal Symptoms: Blackouts, Patient aware of relationship between substance abuse and physical/medical complications, Tremors Name of Substance 1: Alcohol 1 - Age of First Use: 7 1 - Amount (size/oz): about 10 shots or more daily  of liquor 1 - Frequency: daily 1 - Duration: ongoing 1 - Last Use / Amount: today, glass of wine/ one shot of vodka 1 - Method of Aquiring: store 1- Route of Use: oral consumption                  Current Medications:  Current  Facility-Administered Medications  Medication Dose Route Frequency Provider Last Rate Last Admin   acamprosate  (CAMPRAL ) tablet 666 mg  666 mg Oral TID Adelin Ventrella, MD   666 mg at 12/06/23 0929   alum & mag hydroxide-simeth (MAALOX/MYLANTA) 200-200-20 MG/5ML suspension 30 mL  30 mL Oral Q4H PRN Dorthea Gauze, NP       folic acid  (FOLVITE ) tablet 1 mg  1 mg Oral Daily Perla Echavarria, MD   1 mg at 12/06/23 7564   hydrOXYzine  (ATARAX ) tablet 50 mg  50 mg Oral QHS PRN Pravin Perezperez, MD   50 mg at 12/05/23 2059   LORazepam  (ATIVAN ) tablet 1-4 mg  1-4 mg Oral Q1H PRN Norbert Bean, MD       Or   LORazepam  (ATIVAN ) tablet 1 mg  1 mg Oral Q1H PRN Norbert Bean, MD       magnesium  hydroxide (MILK OF MAGNESIA) suspension 30 mL  30 mL Oral Daily PRN Dorthea Gauze, NP       metFORMIN  (GLUCOPHAGE -XR) 24 hr tablet 500 mg  500 mg Oral Q breakfast Dorthea Gauze, NP   500 mg at 12/06/23 3329   multivitamin with minerals tablet 1 tablet  1 tablet Oral Daily Dorthea Gauze, NP   1 tablet at 12/06/23 5188   OLANZapine  (ZYPREXA ) injection 10 mg  10 mg Intramuscular TID PRN Dorthea Gauze, NP       OLANZapine  (ZYPREXA ) injection 5 mg  5 mg Intramuscular TID PRN Dorthea Gauze, NP       OLANZapine  zydis (ZYPREXA ) disintegrating tablet 5 mg  5 mg Oral TID PRN Dorthea Gauze, NP       [START ON 12/07/2023] sertraline  (ZOLOFT ) tablet 50 mg  50 mg Oral Daily Mylan Lengyel, MD       thiamine  (VITAMIN B1) tablet 100 mg  100 mg Oral Daily Dorthea Gauze, NP   100 mg at 12/06/23 4166   traZODone  (DESYREL ) tablet 50 mg  50 mg Oral QHS PRN Alexandria Shiflett, MD       Current Outpatient Medications  Medication Sig Dispense Refill   albuterol  (VENTOLIN  HFA) 108 (90 Base) MCG/ACT inhaler Inhale 2 puffs into the lungs every 4 (four) hours as needed for wheezing or shortness of breath.     blood glucose meter kit and supplies KIT Dispense based on patient and insurance preference. Use up to four times daily as directed.  Please include lancets, test strips, control solution. 1 each 0   Continuous Blood Gluc Sensor (FREESTYLE LIBRE 3 SENSOR) MISC 1 each by Does not apply route every 14 (fourteen) days. 6 each 1   Continuous Glucose Monitor Sup KIT 1 each by Does not apply route continuous. 1 kit 0   metFORMIN  (GLUCOPHAGE -XR) 500 MG 24 hr tablet Take 1 tablet (500 mg total) by mouth daily with breakfast. 90 tablet 3   rosuvastatin  (CRESTOR ) 5 MG tablet Take 1 tablet (5 mg total) by mouth daily. 90 tablet 3   diazepam  (VALIUM ) 2 MG tablet Take 1 tablet (2 mg total) by mouth every 6 (six) hours as needed (Dizziness). (Patient not taking: Reported on 12/03/2023) 15 tablet 0   meclizine  (ANTIVERT ) 25  MG tablet Take 1 tablet (25 mg total) by mouth 2 (two) times daily as needed for dizziness. (Patient not taking: Reported on 12/03/2023) 90 tablet 1   promethazine  (PHENERGAN ) 25 MG tablet Take 1 tablet (25 mg total) by mouth every 8 (eight) hours as needed for nausea or vomiting. (Patient not taking: Reported on 12/03/2023) 20 tablet 0    Labs  Lab Results:  Admission on 12/02/2023  Component Date Value Ref Range Status   WBC 12/02/2023 9.1  4.0 - 10.5 K/uL Final   RBC 12/02/2023 3.74 (L)  3.87 - 5.11 MIL/uL Final   Hemoglobin 12/02/2023 14.6  12.0 - 15.0 g/dL Final   HCT 16/03/9603 40.2  36.0 - 46.0 % Final   MCV 12/02/2023 107.5 (H)  80.0 - 100.0 fL Final   MCH 12/02/2023 39.0 (H)  26.0 - 34.0 pg Final   MCHC 12/02/2023 36.3 (H)  30.0 - 36.0 g/dL Final   RDW 54/02/8118 11.9  11.5 - 15.5 % Final   Platelets 12/02/2023 212  150 - 400 K/uL Final   nRBC 12/02/2023 0.0  0.0 - 0.2 % Final   Neutrophils Relative % 12/02/2023 54  % Final   Neutro Abs 12/02/2023 4.8  1.7 - 7.7 K/uL Final   Lymphocytes Relative 12/02/2023 38  % Final   Lymphs Abs 12/02/2023 3.5  0.7 - 4.0 K/uL Final   Monocytes Relative 12/02/2023 6  % Final   Monocytes Absolute 12/02/2023 0.6  0.1 - 1.0 K/uL Final   Eosinophils Relative 12/02/2023 1  %  Final   Eosinophils Absolute 12/02/2023 0.1  0.0 - 0.5 K/uL Final   Basophils Relative 12/02/2023 1  % Final   Basophils Absolute 12/02/2023 0.1  0.0 - 0.1 K/uL Final   Immature Granulocytes 12/02/2023 0  % Final   Abs Immature Granulocytes 12/02/2023 0.02  0.00 - 0.07 K/uL Final   Performed at [facility], [address] [place], [place] [address]   Sodium 12/04/2023 138  135 - 145 mmol/L Final   Potassium 12/04/2023 3.7  3.5 - 5.1 mmol/L Final   Chloride 12/04/2023 102  98 - 111 mmol/L Final   CO2 12/04/2023 24  22 - 32 mmol/L Final   Glucose, Bld 12/04/2023 136 (H)  70 - 99 mg/dL Final   Glucose reference range applies only to samples taken after fasting for at least 8 hours.   BUN 12/04/2023 7  6 - 20 mg/dL Final   Creatinine, Ser 12/04/2023 0.51  0.44 - 1.00 mg/dL Final   Calcium  12/04/2023 9.7  8.9 - 10.3 mg/dL Final   Total Protein 95/62/1308 8.0  6.5 - 8.1 g/dL Final   Albumin 65/78/4696 3.4 (L)  3.5 - 5.0 g/dL Final   AST 29/52/8413 92 (H)  15 - 41 U/L Final   ALT 12/04/2023 66 (H)  0 - 44 U/L Final   Alkaline Phosphatase 12/04/2023 85  38 - 126 U/L Final   Total Bilirubin 12/04/2023 1.8 (H)  0.0 - 1.2 mg/dL Final   GFR, Estimated 12/04/2023 >60  >60 mL/min Final   Comment: (NOTE) Calculated using the CKD-EPI Creatinine Equation (2021)    Anion gap 12/04/2023 12  5 - 15 Final   Performed at [facility], [address], [place], [place] [address]   Alcohol, Ethyl (B) 12/02/2023 21 (H)  <15 mg/dL Final   Comment: (NOTE) For medical purposes only. Performed at [facility], [address], [place], [place] [address]  TSH 12/02/2023 3.227  0.350 - 4.500 uIU/mL Final   Comment: Performed by a 3rd Generation assay with a functional sensitivity of <=0.01 uIU/mL. Performed at [facility], [address], [place], [place] [address]    Preg Test, Ur 12/02/2023 Negative  Negative Final   POC Amphetamine UR 12/02/2023 None Detected  NONE DETECTED (Cut  Off Level 1000 ng/mL) Final   POC Secobarbital (BAR) 12/02/2023 None Detected  NONE DETECTED (Cut Off Level 300 ng/mL) Final   POC Buprenorphine (BUP) 12/02/2023 None Detected  NONE DETECTED (Cut Off Level 10 ng/mL) Final   POC Oxazepam (BZO) 12/02/2023 None Detected  NONE DETECTED (Cut Off Level 300 ng/mL) Final   POC Cocaine UR 12/02/2023 None Detected  NONE DETECTED (Cut Off Level 300 ng/mL) Final   POC Methamphetamine UR 12/02/2023 None Detected  NONE DETECTED (Cut Off Level 1000 ng/mL) Final   POC Morphine 12/02/2023 None Detected  NONE DETECTED (Cut Off Level 300 ng/mL) Final   POC Methadone UR 12/02/2023 None Detected  NONE DETECTED (Cut Off Level 300 ng/mL) Final   POC Oxycodone UR 12/02/2023 None Detected  NONE DETECTED (Cut Off Level 100 ng/mL) Final   POC Marijuana UR 12/02/2023 None Detected  NONE DETECTED (Cut Off Level 50 ng/mL) Final  Office Visit on 11/14/2023  Component Date Value Ref Range Status   Hemoglobin A1C 11/14/2023 5.6  4.0 - 5.6 % Final   HbA1c, POC (controlled diabetic ra* 11/14/2023 5.6  0.0 - 7.0 % Final   Microalbumin Ur, POC 11/14/2023 30  mg/L Final   Creatinine, POC 11/14/2023 100  mg/dL Final   Albumin/Creatinine Ratio, Urine, P* 11/14/2023 <30   Final   Hepatitis B Surface Ag 11/14/2023 Negative  Negative Final   Hep B Surface Ab, Qual 11/14/2023 Non Reactive   Final   Comment:              Non Reactive: Not immune to HBV infection.              Equivocal: Unable to determine if anti-HBs                         is present at levels consistent                         with immunity.               Reactive: Anti-HBs concentration detected                         at greater than 10 mIU/mL.                         Individual is considered to be                         immune to infection with HBV.    Hep B Core Total Ab 11/14/2023 Negative  Negative Final   Rfx to HBc IgM 11/14/2023 Comment   Final   Reflex criteria was not met.   Interpretation  11/14/2023 Comment   Final   Comment:                    HBV Serology Interpretation Chart  -------------------------------------------------------------------  Interpretation             HBsAg  anti-HBs  anti-HBc  anti-HBc IgM  -------------------------------------------------------------------  Key - Analyte present: + Analyte absent: - Test not indicated: TNI  -------------------------------------------------------------------  Susceptible (never  infected and no evidence    -        -         -          TNI  of vaccination)  -------------------------------------------------------------------  Immune due to natural  resolved infection          -        +         +          TNI  -------------------------------------------------------------------  Immune due to vaccination   -        +         -          TNI  -------------------------------------------------------------------  Acute Infection             +        -         +           +  -------------------------------------------------------------------                            Chronic infection           +        -         +           -  -------------------------------------------------------------------  Interpretation unclear*     -        -         +          +/-  -------------------------------------------------------------------  *Multiple possibilities: resolved infection (most common); false-   positive anti-HBc (susceptible); low-level chronic infection;   resolving acute infection.    HCV Ab 11/14/2023 Non Reactive  Non Reactive Final   RPR Ser Ql 11/14/2023 Non Reactive  Non Reactive Final   HIV Screen 4th Generation wRfx 11/14/2023 Non Reactive  Non Reactive Final   Comment: HIV-1/HIV-2 antibodies and HIV-1 p24 antigen were NOT detected. There is no laboratory evidence of HIV infection. HIV Negative    Chlamydia by NAA 11/14/2023 Negative  Negative Final   Gonococcus by NAA 11/14/2023 Negative  Negative Final    Trich vag by NAA 11/14/2023 Negative  Negative Final   WBC 11/14/2023 7.4  3.4 - 10.8 x10E3/uL Final   RBC 11/14/2023 3.83  3.77 - 5.28 x10E6/uL Final   Hemoglobin 11/14/2023 15.0  11.1 - 15.9 g/dL Final   Hematocrit 16/03/9603 42.1  34.0 - 46.6 % Final   MCV 11/14/2023 110 (H)  79 - 97 fL Final   MCH 11/14/2023 39.2 (H)  26.6 - 33.0 pg Final   MCHC 11/14/2023 35.6  31.5 - 35.7 g/dL Final   RDW 54/02/8118 12.6  11.7 - 15.4 % Final   Platelets 11/14/2023 204  150 - 450 x10E3/uL Final   Neutrophils 11/14/2023 48  Not Estab. % Final   Lymphs 11/14/2023 41  Not Estab. % Final   Monocytes 11/14/2023 7  Not Estab. % Final   Eos 11/14/2023 3  Not Estab. % Final   Basos 11/14/2023 1  Not Estab. % Final   Neutrophils Absolute 11/14/2023 3.5  1.4 - 7.0 x10E3/uL Final   Lymphocytes Absolute 11/14/2023 3.0  0.7 - 3.1 x10E3/uL Final   Monocytes Absolute 11/14/2023 0.5  0.1 -  0.9 x10E3/uL Final   EOS (ABSOLUTE) 11/14/2023 0.2  0.0 - 0.4 x10E3/uL Final   Basophils Absolute 11/14/2023 0.1  0.0 - 0.2 x10E3/uL Final   Immature Granulocytes 11/14/2023 0  Not Estab. % Final   Immature Grans (Abs) 11/14/2023 0.0  0.0 - 0.1 x10E3/uL Final   Glucose 11/14/2023 140 (H)  70 - 99 mg/dL Final   BUN 11/91/4782 10  6 - 20 mg/dL Final   Creatinine, Ser 11/14/2023 0.48 (L)  0.57 - 1.00 mg/dL Final   eGFR 95/62/1308 123  >59 mL/min/1.73 Final   BUN/Creatinine Ratio 11/14/2023 21  9 - 23 Final   Sodium 11/14/2023 137  134 - 144 mmol/L Final   Potassium 11/14/2023 4.6  3.5 - 5.2 mmol/L Final   Chloride 11/14/2023 99  96 - 106 mmol/L Final   CO2 11/14/2023 24  20 - 29 mmol/L Final   Calcium  11/14/2023 9.7  8.7 - 10.2 mg/dL Final   Total Protein 65/78/4696 8.2  6.0 - 8.5 g/dL Final   Albumin 29/52/8413 4.2  3.9 - 4.9 g/dL Final   Globulin, Total 11/14/2023 4.0  1.5 - 4.5 g/dL Final   Bilirubin Total 11/14/2023 1.4 (H)  0.0 - 1.2 mg/dL Final   Alkaline Phosphatase 11/14/2023 88  44 - 121 IU/L Final   AST 11/14/2023  86 (H)  0 - 40 IU/L Final   ALT 11/14/2023 70 (H)  0 - 32 IU/L Final   Cholesterol, Total 11/14/2023 246 (H)  100 - 199 mg/dL Final   Triglycerides 24/40/1027 91  0 - 149 mg/dL Final   HDL 25/36/6440 102  >39 mg/dL Final   VLDL Cholesterol Cal 11/14/2023 15  5 - 40 mg/dL Final   LDL Chol Calc (NIH) 11/14/2023 129 (H)  0 - 99 mg/dL Final   Chol/HDL Ratio 11/14/2023 2.4  0.0 - 4.4 ratio Final   Comment:                                   T. Chol/HDL Ratio                                             Men  Women                               1/2 Avg.Risk  3.4    3.3                                   Avg.Risk  5.0    4.4                                2X Avg.Risk  9.6    7.1                                3X Avg.Risk 23.4   11.0    Ethanol 11/14/2023 <.010  Cutoff=0.010 % Final   TSH 11/14/2023 2.710  0.450 - 4.500 uIU/mL Final   Trichomonas Exam 11/14/2023 Negative  Negative Final   Yeast Exam 11/14/2023 Negative  Negative  Final   Clue Cell Exam 11/14/2023 Positive (A)  Negative Final   Color, UA 11/14/2023 yellow  yellow Final   Clarity, UA 11/14/2023 clear  clear Final   Glucose, UA 11/14/2023 negative  negative mg/dL Final   Bilirubin, UA 60/45/4098 negative  negative Final   Ketones, POC UA 11/14/2023 negative  negative mg/dL Final   Spec Grav, UA 11/91/4782 1.015  1.010 - 1.025 Final   Blood, UA 11/14/2023 negative  negative Final   pH, UA 11/14/2023 7.5  5.0 - 8.0 Final   POC PROTEIN,UA 11/14/2023 negative  negative, trace Final   Urobilinogen, UA 11/14/2023 4.0 (A)  0.2 or 1.0 E.U./dL Final   Nitrite, UA 95/62/1308 Negative  Negative Final   Leukocytes, UA 11/14/2023 Negative  Negative Final   Urine Culture, Routine 11/14/2023 Final report   Final   Organism ID, Bacteria 11/14/2023 Comment   Final   Comment: Mixed urogenital flora Less than 10,000 colonies/mL    HCV Interp 1: 11/14/2023 Comment   Final   Comment: Not infected with HCV unless early or acute infection  is suspected (which may be delayed in an immunocompromised individual), or other evidence exists to indicate HCV infection.   Admission on 08/19/2023, Discharged on 08/19/2023  Component Date Value Ref Range Status   Sodium 08/19/2023 137  135 - 145 mmol/L Final   Potassium 08/19/2023 3.5  3.5 - 5.1 mmol/L Final   Chloride 08/19/2023 105  98 - 111 mmol/L Final   CO2 08/19/2023 21 (L)  22 - 32 mmol/L Final   Glucose, Bld 08/19/2023 198 (H)  70 - 99 mg/dL Final   Glucose reference range applies only to samples taken after fasting for at least 8 hours.   BUN 08/19/2023 13  6 - 20 mg/dL Final   Creatinine, Ser 08/19/2023 0.44  0.44 - 1.00 mg/dL Final   Calcium  08/19/2023 9.0  8.9 - 10.3 mg/dL Final   GFR, Estimated 08/19/2023 >60  >60 mL/min Final   Comment: (NOTE) Calculated using the CKD-EPI Creatinine Equation (2021)    Anion gap 08/19/2023 11  5 - 15 Final   Performed at [facility], [address] [place], [place] [address]   WBC 08/19/2023 5.5  4.0 - 10.5 K/uL Final   RBC 08/19/2023 3.57 (L)  3.87 - 5.11 MIL/uL Final   Hemoglobin 08/19/2023 13.8  12.0 - 15.0 g/dL Final   HCT 69/62/9528 39.7  36.0 - 46.0 % Final   MCV 08/19/2023 111.2 (H)  80.0 - 100.0 fL Final   MCH 08/19/2023 38.7 (H)  26.0 - 34.0 pg Final   MCHC 08/19/2023 34.8  30.0 - 36.0 g/dL Final   RDW 41/32/4401 12.7  11.5 - 15.5 % Final   Platelets 08/19/2023 183  150 - 400 K/uL Final   nRBC 08/19/2023 0.0  0.0 - 0.2 % Final   Performed at [facility], [address] [place], [place] [address]   Preg, Serum 08/19/2023 NEGATIVE  NEGATIVE Final   Comment:        THE SENSITIVITY OF THIS METHODOLOGY IS >10 mIU/mL. Performed at [facility], [address] [place], [place] [address]   Admission on 07/29/2023, Discharged on 07/30/2023  Component Date Value Ref Range Status   WBC 07/29/2023 5.2  4.0 - 10.5 K/uL Final   RBC 07/29/2023 3.59 (L)  3.87 - 5.11  MIL/uL Final   Hemoglobin 07/29/2023 14.1  12.0 - 15.0 g/dL Final   HCT 03/47/4259  40.5  36.0 - 46.0 % Final   MCV 07/29/2023 112.8 (H)  80.0 - 100.0 fL Final   MCH 07/29/2023 39.3 (H)  26.0 - 34.0 pg Final   MCHC 07/29/2023 34.8  30.0 - 36.0 g/dL Final   RDW 16/03/9603 13.1  11.5 - 15.5 % Final   Platelets 07/29/2023 171  150 - 400 K/uL Final   nRBC 07/29/2023 0.0  0.0 - 0.2 % Final   Performed at [facility], [address], [place], [place] [address]   Preg, Serum 07/29/2023 NEGATIVE  NEGATIVE Final   Comment:        THE SENSITIVITY OF THIS METHODOLOGY IS >10 mIU/mL. Performed at [facility], [address], [place], [place] [address]    SARS Coronavirus 2 by RT PCR 07/29/2023 NEGATIVE  NEGATIVE Final   Comment: (NOTE) SARS-CoV-2 target nucleic acids are NOT DETECTED.  The SARS-CoV-2 RNA is generally detectable in upper respiratory specimens during the acute phase of infection. The lowest concentration of SARS-CoV-2 viral copies this assay can detect is 138 copies/mL. A negative result does not preclude SARS-Cov-2 infection and should not be used as the sole basis for treatment or other patient management decisions. A negative result may occur with  improper specimen collection/handling, submission of specimen other than nasopharyngeal swab, presence of viral mutation(s) within the areas targeted by this assay, and inadequate number of viral copies(<138 copies/mL). A negative result must be combined with clinical observations, patient history, and epidemiological information. The expected result is Negative.  Fact Sheet for Patients:  BloggerCourse.com  Fact Sheet for Healthcare Providers:  SeriousBroker.it  This test is no                          t yet approved or cleared by the [place]  FDA and  has been authorized for detection and/or diagnosis of SARS-CoV-2 by FDA under an  Emergency Use Authorization (EUA). This EUA will remain  in effect (meaning this test can be used) for the duration of the COVID-19 declaration under Section 564(b)(1) of the Act, 21 U.S.C.section 360bbb-3(b)(1), unless the authorization is terminated  or revoked sooner.       Influenza A by PCR 07/29/2023 NEGATIVE  NEGATIVE Final   Influenza B by PCR 07/29/2023 NEGATIVE  NEGATIVE Final   Comment: (NOTE) The Xpert Xpress SARS-CoV-2/FLU/RSV plus assay is intended as an aid in the diagnosis of influenza from Nasopharyngeal swab specimens and should not be used as a sole basis for treatment. Nasal washings and aspirates are unacceptable for Xpert Xpress SARS-CoV-2/FLU/RSV testing.  Fact Sheet for Patients: BloggerCourse.com  Fact Sheet for Healthcare Providers: SeriousBroker.it  This test is not yet approved or cleared by the [place]  FDA and has been authorized for detection and/or diagnosis of SARS-CoV-2 by FDA under an Emergency Use Authorization (EUA). This EUA will remain in effect (meaning this test can be used) for the duration of the COVID-19 declaration under Section 564(b)(1) of the Act, 21 U.S.C. section 360bbb-3(b)(1), unless the authorization is terminated or revoked.     Resp Syncytial Virus by PCR 07/29/2023 NEGATIVE  NEGATIVE Final   Comment: (NOTE) Fact Sheet for Patients: BloggerCourse.com  Fact Sheet for Healthcare Providers: SeriousBroker.it  This test is not yet approved or cleared by the [place]  FDA and has been authorized for detection and/or diagnosis of SARS-CoV-2 by FDA under an Emergency Use Authorization (EUA). This EUA will remain in effect (meaning this  test can be used) for the duration of the COVID-19 declaration under Section 564(b)(1) of the Act, 21 U.S.C. section 360bbb-3(b)(1), unless the authorization is terminated  or revoked.  Performed at [facility], [address], [place], [place] [address]    Sodium 07/30/2023 136  135 - 145 mmol/L Final   Potassium 07/30/2023 3.4 (L)  3.5 - 5.1 mmol/L Final   Chloride 07/30/2023 100  98 - 111 mmol/L Final   CO2 07/30/2023 25  22 - 32 mmol/L Final   Glucose, Bld 07/30/2023 122 (H)  70 - 99 mg/dL Final   Glucose reference range applies only to samples taken after fasting for at least 8 hours.   BUN 07/30/2023 16  6 - 20 mg/dL Final   Creatinine, Ser 07/30/2023 0.58  0.44 - 1.00 mg/dL Final   Calcium  07/30/2023 9.5  8.9 - 10.3 mg/dL Final   GFR, Estimated 07/30/2023 >60  >60 mL/min Final   Comment: (NOTE) Calculated using the CKD-EPI Creatinine Equation (2021)    Anion gap 07/30/2023 11  5 - 15 Final   Performed at [facility], [address], [place], [place] [address]   Troponin I (High Sensitivity) 07/30/2023 <2  <18 ng/L Final   Comment: (NOTE) Elevated high sensitivity troponin I (hsTnI) values and significant  changes across serial measurements may suggest ACS but many other  chronic and acute conditions are known to elevate hsTnI results.  Refer to the Links section for chest pain algorithms and additional  guidance. Performed at [facility], [address], [place], [place] [address]    Glucose-Capillary 07/30/2023 141 (H)  70 - 99 mg/dL Final   Glucose reference range applies only to samples taken after fasting for at least 8 hours.    Blood Alcohol level:  Lab Results  Component Value Date   ETH 21 (H) 12/02/2023    Metabolic Disorder Labs: Lab Results  Component Value Date   HGBA1C 5.6 11/14/2023   HGBA1C 5.6 11/14/2023   MPG 111 09/18/2019   No results found for: PROLACTIN Lab Results  Component Value Date   CHOL 246 (H) 11/14/2023   TRIG 91 11/14/2023   HDL 102 11/14/2023   CHOLHDL 2.4 11/14/2023   LDLCALC 129 (H) 11/14/2023   LDLCALC 104 (H) 03/27/2023     Therapeutic Lab Levels: No results found for: LITHIUM No results found for: VALPROATE No results found for: CBMZ  Physical Findings   AUDIT    Flowsheet Row ED from 12/02/2023 in [facility]  Alcohol Use Disorder Identification Test Final Score (AUDIT) 31   GAD-7    Flowsheet Row Office Visit from 11/14/2023 in [facility] at [facility] Office Visit from 06/06/2022 in [facility] at [facility] Office Visit from 11/03/2019 in [facility] Primary [facility] & [facility] Medicine at [facility] Office Visit from 09/28/2019 in [facility] Medicine at [facility] Office Visit from 01/26/2019 in [facility] Primary Care & Sports Medicine at [facility]  Total GAD-7 Score 10 9 0 0 5   PHQ2-9    Flowsheet Row ED from 12/02/2023 in [facility] Office Visit from 11/14/2023 in [facility] Care & Sports Medicine at [facility] Office Visit from 08/07/2022 in [facility] Care & [facility] Medicine at [facility] Office Visit from 06/06/2022 in [facility] Primary [facility] & Sports Medicine  at [facility] Office Visit from 02/01/2022 in [facility] Primary Care & Sports Medicine at [facility] Total Score 2 5 0 4 0  PHQ-9 Total Score 5 15 -- 16 --   Flowsheet Row ED from 12/02/2023 in [facility] Most recent reading at 12/02/2023  9:44 PM ED from 12/02/2023 in [facility] Most recent reading at 12/02/2023  6:00 PM ED from 08/19/2023 in [facility] Emergency Department at [facility] Most recent reading at 08/19/2023  8:39 AM  C-SSRS RISK CATEGORY No Risk No Risk No Risk     Musculoskeletal  Strength & Muscle Tone: within normal limits Gait & Station: normal Patient leans: N/A  Psychiatric  Specialty Exam  Presentation  General Appearance:  Casual  Eye Contact: Good  Speech: Clear and Coherent  Speech Volume: Normal  Handedness: Right   Mood and Affect  Mood: About the same  Affect: Anxious, Congruent   Thought Process  Thought Processes: Coherent  Descriptions of Associations:Intact  Orientation:Full (Time, Place and Person)  Thought Content:Logical  Diagnosis of Schizophrenia or Schizoaffective disorder in past: No    Hallucinations: None  Ideas of Reference:None  Suicidal Thoughts: None  Homicidal Thoughts: None   Sensorium  Memory: Immediate Good  Judgment: Fair  Insight: Fair   Art therapist  Concentration: Fair  Attention Span: Fair  Recall: Good  Fund of Knowledge: Good  Language: Good   Psychomotor Activity  Psychomotor Activity: Normal    Assets  Assets: Desire for Improvement; Resilience   Sleep  Sleep: Poor    Physical Exam  Physical Exam ROS Physical Exam Constitutional:      Appearance: the patient is not toxic-appearing.  Pulmonary:     Effort: Pulmonary effort is normal.  Neurological:     General: No focal deficit present.     Mental Status: the patient is alert and oriented to person, place, and time.   Review of Systems  Respiratory:  Negative for shortness of breath.   Cardiovascular:  Negative for chest pain.  Gastrointestinal:  Negative for abdominal pain, constipation, diarrhea, nausea and vomiting.  Neurological:  Negative for headaches.   Blood pressure 115/88, pulse 99, temperature 98.1 F (36.7 C), temperature source Oral, resp. rate 16, SpO2 99%. There is no height or weight on file to calculate BMI.  Treatment Plan Summary: Daily contact with patient to assess and evaluate symptoms and progress in treatment, Medication management, and Plan    Patient is a 38 year old female with a past psychiatric history of MDD, GAD, alcohol use disorder, no past  psychiatric hospitalizations or suicide attempts who presented to the behavioral health urgent care with her daughter for alcohol detox and is interested in residential treatment.   Patient not experiencing much withdrawal symptoms at this time. BP stable. Switched patient to symptom-triggered CIWA protocol given she continues to decline ativan  taper despite education. She has severe alcohol use disorder that is impacting occupational and social functioning. Started acamprosate  without side effects. She also has history of MDD and GAD which was treated with zoloft . 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  UDS negative.  TSH within normal limits.  MCV 107.5. CMP AST 92, ALT 66, Tbili 1.8, Cr 0.51. EKG Qtc 479.   #Alcohol use disorder -continue CIWA, last drink  6/9  -continue sx-triggered ativan   -continue PRN ativan  for CIWA >10  -PRN: atarax , tylenol , maalox, imodium , milk of mg, zofran  -daily MV, thiamine   -continue acamprosate  666 TID for alcohol use disorder  #Substance-induced mood disorder #History of MDD, GAD  --increase zoloft  50mg  for depression and anxiety  --continue hydroxyzine  50mg  PRN at bedtime for insomnia  --continue trazodone  50mg  for insomnia   #DM  -continue home metformin   -continue AM fasting glucose   Dispo: [facility] at [facility], MD, PGY-2 12/06/2023 10:09 AM  This case was discussed with attending Dr. Genita Keys who agrees with the above formulated treatment plan. Please see attending attestation for additional details.   This note was created using a voice recognition software as a result there may be grammatical errors inadvertently enclosed that do not reflect the nature of this encounter. Every attempt is made to correct such errors.

## 2023-12-06 NOTE — Group Note (Signed)
 Group Topic: Positive Affirmations  Group Date: 12/06/2023 Start Time: 1730 End Time: 1810 Facilitators: Esther Hem, NT  Department: [facility]  Number of Participants: 7  Group Focus: affirmation and reminiscence Treatment Modality:  Psychoeducation Interventions utilized were reminiscence and support Purpose: increase insight and regain self-worth  Name: Sara Simpson Sara Simpson Date of Birth: 12/18/1985  MR: [id number]    Level of Participation: when cued Quality of Participation: attentive and cooperative Interactions with others: gave feedback Mood/Affect: appropriate Triggers (if applicable): n/a Cognition: coherent/clear Progress: Gaining insight Response: PT agreed with her positive affirmation that she was intelligent and resourceful. Plan: patient will be encouraged to attend group  Patients Problems:  Patient Active Problem List   Diagnosis Date Noted   Alcohol abuse 12/02/2023   Benign paroxysmal positional vertigo 03/27/2023   Screening examination for STD (sexually transmitted disease) 03/27/2023   Controlled type 2 diabetes mellitus without complication, without long-term current use of insulin  (HCC) 01/31/2022   GAD (generalized anxiety disorder) 09/28/2019   Seasonal allergies 09/28/2019   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

## 2023-12-06 NOTE — Group Note (Signed)
 Group Topic: Fears and Unhealthy Coping Skills  Group Date: 12/06/2023 Start Time: 2000 End Time: 2030 Facilitators: Alvino Joseph, NT  Department: [facility]  Number of Participants: 7  Group Focus: clarity of thought Treatment Modality:  Individual Therapy Interventions utilized were group exercise Purpose: express feelings  Name: Sara Simpson [name] Date of Birth: 1986/03/26  MR: [id number]    Level of Participation: active Quality of Participation: cooperative Interactions with others: gave feedback Mood/Affect: appropriate Triggers (if applicable): n/a Cognition: coherent/clear Progress: Moderate Response: n/a Plan: follow-up needed  Patients Problems:  Patient Active Problem List   Diagnosis Date Noted   Alcohol abuse 12/02/2023   Benign paroxysmal positional vertigo 03/27/2023   Screening examination for STD (sexually transmitted disease) 03/27/2023   Controlled type 2 diabetes mellitus without complication, without long-term current use of insulin  (HCC) 01/31/2022   GAD (generalized anxiety disorder) 09/28/2019   Seasonal allergies 09/28/2019   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

## 2023-12-06 NOTE — ED Notes (Signed)
 Patient is sleeping. Respirations equal and unlabored. No change in assessment or acuity. Routine safety checks conducted according to facility protocol.

## 2023-12-07 DIAGNOSIS — F1994 Other psychoactive substance use, unspecified with psychoactive substance-induced mood disorder: Secondary | ICD-10-CM | POA: Diagnosis not present

## 2023-12-07 DIAGNOSIS — F1020 Alcohol dependence, uncomplicated: Secondary | ICD-10-CM | POA: Diagnosis not present

## 2023-12-07 DIAGNOSIS — F419 Anxiety disorder, unspecified: Secondary | ICD-10-CM | POA: Diagnosis not present

## 2023-12-07 DIAGNOSIS — F339 Major depressive disorder, recurrent, unspecified: Secondary | ICD-10-CM | POA: Diagnosis not present

## 2023-12-07 MED ORDER — IBUPROFEN 600 MG PO TABS
600.0000 mg | ORAL_TABLET | Freq: Once | ORAL | Status: AC
  Administered 2023-12-07: 600 mg via ORAL
  Filled 2023-12-07: qty 1

## 2023-12-07 NOTE — ED Notes (Signed)
 Patient asleep in bed at this time.  No distress or complaint.  Will monitor and provide safe environment.  Will meet needs as they arise.

## 2023-12-07 NOTE — ED Notes (Signed)
 Patient is in the bedroom calm and sleeping with eyes closed. NAD. Will continue to monitor for safety.

## 2023-12-07 NOTE — Group Note (Signed)
 Group Topic: Overcoming Obstacles  Group Date: 12/07/2023 Start Time: 1630 End Time: 1700 Facilitators: Esther Hem, NT  Department: [facility]  Number of Participants: 9  Group Focus: relapse prevention and self-esteem Treatment Modality:  Psychoeducation Interventions utilized were support Purpose: increase insight and relapse prevention strategies  Name: Javaria Knapke Date of Birth: 1985/12/16  MR: [id number]    Level of Participation: moderate Quality of Participation: cooperative Interactions with others: gave feedback Mood/Affect: appropriate Triggers (if applicable): n/a Cognition: coherent/clear Progress: Gaining insight Response: PT stated that listing all of the people that she had wronged in her life would be a task (referring to NA steps) Plan: patient will be encouraged to attend group  Patients Problems:  Patient Active Problem List   Diagnosis Date Noted   Alcohol abuse 12/02/2023   Benign paroxysmal positional vertigo 03/27/2023   Screening examination for STD (sexually transmitted disease) 03/27/2023   Controlled type 2 diabetes mellitus without complication, without long-term current use of insulin  (HCC) 01/31/2022   GAD (generalized anxiety disorder) 09/28/2019   Seasonal allergies 09/28/2019   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

## 2023-12-07 NOTE — ED Provider Notes (Cosign Needed Addendum)
 Behavioral Health Progress Note  Date and Time: 12/07/2023 10:39 AM Name: Sara Simpson MRN:  [id number]  Chart review: Vitals stable. Compliant with medications besides trazodone . Took PRN atarax . SW reports patient has been approved to go to [facility] by 9am on Monday. Attended groups. No acute events overnight.   Subjective:   Patient is a 38 year old female with a past psychiatric history of MDD, GAD, alcohol use disorder, no past psychiatric hospitalizations or suicide attempts who presented to the behavioral health urgent care with her daughter for alcohol detox and is interested in residential treatment.  Patient overall reports doing well.  Denies SI/HI/AVH.  Reports eating and sleeping fairly well.  Reports tolerating medications well at this time.  Denies any acute somatic complaints from starting sertraline .  We discussed timing of alcohol withdrawal symptoms and she was amenable to continuing medication regimen at this time.  Diagnosis:  Final diagnoses:  Recurrent major depressive disorder, remission status unspecified (HCC)  Alcohol use disorder, severe, dependence (HCC)  Anxiety disorder, unspecified type  Substance induced mood disorder (HCC)   Past Psychiatric History:  She reports past psychiatric history of MDD, GAD.  She reports that she saw a psychiatrist once for a vocational scholarship program.  She reports that she has been on Zoloft  and Prozac in the past.  She reports that Zoloft  helped for her depression and anxiety but when she was drinking it made her feel more anxiety and edgy.  She also reports being prescribed Valium  in the past for anxiety.  She denies having seen a therapist in the past.  She denies past psychiatric hospitalizations or suicide attempts.  Past Medical History:  She reports a medical history of diabetes on metformin .  Per chart review, patient also has history of hypertension and generalized anxiety disorder and possibly fatty  liver disease as well as elevated LFTs.  She reports allergies to penicillin and Prozac.  She reports that Prozac made her feel crazy and she was not herself.  Family History: None reported  Social History: She reports that she lives with her 39 year old daughter and 58 year old son.  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  She denies any access to guns.  She denies any legal issues.  She reports that her children are her main support system.  Additional Social History:    Pain Medications: See MAR Prescriptions: See MAR Over the Counter: See MAR History of alcohol / drug use?: Yes Longest period of sobriety (when/how long): Unknown Negative Consequences of Use: Personal relationships, Financial Withdrawal Symptoms: Blackouts, Patient aware of relationship between substance abuse and physical/medical complications, Tremors Name of Substance 1: Alcohol 1 - Age of First Use: 7 1 - Amount (size/oz): about 10 shots or more daily of liquor 1 - Frequency: daily 1 - Duration: ongoing 1 - Last Use / Amount: today, glass of wine/ one shot of vodka 1 - Method of Aquiring: store 1- Route of Use: oral consumption                  Current Medications:  Current Facility-Administered Medications  Medication Dose Route Frequency Provider Last Rate Last Admin   acamprosate  (CAMPRAL ) tablet 666 mg  666 mg Oral TID Chien, Stephanie, MD   666 mg at 12/07/23 0947   alum & mag hydroxide-simeth (MAALOX/MYLANTA) 200-200-20 MG/5ML suspension 30 mL  30 mL Oral Q4H PRN Dorthea Gauze, NP       folic acid  (FOLVITE ) tablet 1 mg  1 mg Oral Daily Chien,  Trevor Fudge, MD   1 mg at 12/07/23 0947   hydrOXYzine  (ATARAX ) tablet 50 mg  50 mg Oral QHS PRN Chien, Stephanie, MD   50 mg at 12/05/23 2059   magnesium  hydroxide (MILK OF MAGNESIA) suspension 30 mL  30 mL Oral Daily PRN Dorthea Gauze, NP       metFORMIN  (GLUCOPHAGE -XR) 24 hr tablet 500 mg  500 mg Oral Q breakfast Dorthea Gauze, NP   500 mg at 12/07/23 4098    multivitamin with minerals tablet 1 tablet  1 tablet Oral Daily Dorthea Gauze, NP   1 tablet at 12/07/23 1191   OLANZapine  (ZYPREXA ) injection 10 mg  10 mg Intramuscular TID PRN Dorthea Gauze, NP       OLANZapine  (ZYPREXA ) injection 5 mg  5 mg Intramuscular TID PRN Dorthea Gauze, NP       OLANZapine  zydis (ZYPREXA ) disintegrating tablet 5 mg  5 mg Oral TID PRN Dorthea Gauze, NP       sertraline  (ZOLOFT ) tablet 50 mg  50 mg Oral Daily Chien, Stephanie, MD   50 mg at 12/07/23 4782   thiamine  (VITAMIN B1) tablet 100 mg  100 mg Oral Daily Dorthea Gauze, NP   100 mg at 12/07/23 9562   traZODone  (DESYREL ) tablet 50 mg  50 mg Oral QHS PRN Chien, Stephanie, MD       Current Outpatient Medications  Medication Sig Dispense Refill   albuterol  (VENTOLIN  HFA) 108 (90 Base) MCG/ACT inhaler Inhale 2 puffs into the lungs every 4 (four) hours as needed for wheezing or shortness of breath.     blood glucose meter kit and supplies KIT Dispense based on patient and insurance preference. Use up to four times daily as directed. Please include lancets, test strips, control solution. 1 each 0   Continuous Blood Gluc Sensor (FREESTYLE LIBRE 3 SENSOR) MISC 1 each by Does not apply route every 14 (fourteen) days. 6 each 1   Continuous Glucose Monitor Sup KIT 1 each by Does not apply route continuous. 1 kit 0   metFORMIN  (GLUCOPHAGE -XR) 500 MG 24 hr tablet Take 1 tablet (500 mg total) by mouth daily with breakfast. 90 tablet 3   rosuvastatin  (CRESTOR ) 5 MG tablet Take 1 tablet (5 mg total) by mouth daily. 90 tablet 3   diazepam  (VALIUM ) 2 MG tablet Take 1 tablet (2 mg total) by mouth every 6 (six) hours as needed (Dizziness). (Patient not taking: Reported on 12/03/2023) 15 tablet 0   meclizine  (ANTIVERT ) 25 MG tablet Take 1 tablet (25 mg total) by mouth 2 (two) times daily as needed for dizziness. (Patient not taking: Reported on 12/03/2023) 90 tablet 1   promethazine  (PHENERGAN ) 25 MG tablet Take 1 tablet (25 mg total) by  mouth every 8 (eight) hours as needed for nausea or vomiting. (Patient not taking: Reported on 12/03/2023) 20 tablet 0    Labs  Lab Results:  Admission on 12/02/2023  Component Date Value Ref Range Status   WBC 12/02/2023 9.1  4.0 - 10.5 K/uL Final   RBC 12/02/2023 3.74 (L)  3.87 - 5.11 MIL/uL Final   Hemoglobin 12/02/2023 14.6  12.0 - 15.0 g/dL Final   HCT 13/01/6577 40.2  36.0 - 46.0 % Final   MCV 12/02/2023 107.5 (H)  80.0 - 100.0 fL Final   MCH 12/02/2023 39.0 (H)  26.0 - 34.0 pg Final   MCHC 12/02/2023 36.3 (H)  30.0 - 36.0 g/dL Final   RDW 46/96/2952 11.9  11.5 - 15.5 % Final  Platelets 12/02/2023 212  150 - 400 K/uL Final   nRBC 12/02/2023 0.0  0.0 - 0.2 % Final   Neutrophils Relative % 12/02/2023 54  % Final   Neutro Abs 12/02/2023 4.8  1.7 - 7.7 K/uL Final   Lymphocytes Relative 12/02/2023 38  % Final   Lymphs Abs 12/02/2023 3.5  0.7 - 4.0 K/uL Final   Monocytes Relative 12/02/2023 6  % Final   Monocytes Absolute 12/02/2023 0.6  0.1 - 1.0 K/uL Final   Eosinophils Relative 12/02/2023 1  % Final   Eosinophils Absolute 12/02/2023 0.1  0.0 - 0.5 K/uL Final   Basophils Relative 12/02/2023 1  % Final   Basophils Absolute 12/02/2023 0.1  0.0 - 0.1 K/uL Final   Immature Granulocytes 12/02/2023 0  % Final   Abs Immature Granulocytes 12/02/2023 0.02  0.00 - 0.07 K/uL Final   Performed at [facility], [address], [place], [place] [address]   Sodium 12/04/2023 138  135 - 145 mmol/L Final   Potassium 12/04/2023 3.7  3.5 - 5.1 mmol/L Final   Chloride 12/04/2023 102  98 - 111 mmol/L Final   CO2 12/04/2023 24  22 - 32 mmol/L Final   Glucose, Bld 12/04/2023 136 (H)  70 - 99 mg/dL Final   Glucose reference range applies only to samples taken after fasting for at least 8 hours.   BUN 12/04/2023 7  6 - 20 mg/dL Final   Creatinine, Ser 12/04/2023 0.51  0.44 - 1.00 mg/dL Final   Calcium  12/04/2023 9.7  8.9 - 10.3 mg/dL Final   Total Protein 60/45/4098 8.0  6.5 - 8.1 g/dL Final    Albumin 11/91/4782 3.4 (L)  3.5 - 5.0 g/dL Final   AST 95/62/1308 92 (H)  15 - 41 U/L Final   ALT 12/04/2023 66 (H)  0 - 44 U/L Final   Alkaline Phosphatase 12/04/2023 85  38 - 126 U/L Final   Total Bilirubin 12/04/2023 1.8 (H)  0.0 - 1.2 mg/dL Final   GFR, Estimated 12/04/2023 >60  >60 mL/min Final   Comment: (NOTE) Calculated using the CKD-EPI Creatinine Equation (2021)    Anion gap 12/04/2023 12  5 - 15 Final   Performed at [facility], [address], [place], [place] [address]   Alcohol, Ethyl (B) 12/02/2023 21 (H)  <15 mg/dL Final   Comment: (NOTE) For medical purposes only. Performed at [facility], [address], [place], [place] [address]    TSH 12/02/2023 3.227  0.350 - 4.500 uIU/mL Final   Comment: Performed by a 3rd Generation assay with a functional sensitivity of <=0.01 uIU/mL. Performed at [facility], [address] [place], [place] [address]    Preg Test, Ur 12/02/2023 Negative  Negative Final   POC Amphetamine UR 12/02/2023 None Detected  NONE DETECTED (Cut Off Level 1000 ng/mL) Final   POC Secobarbital (BAR) 12/02/2023 None Detected  NONE DETECTED (Cut Off Level 300 ng/mL) Final   POC Buprenorphine (BUP) 12/02/2023 None Detected  NONE DETECTED (Cut Off Level 10 ng/mL) Final   POC Oxazepam (BZO) 12/02/2023 None Detected  NONE DETECTED (Cut Off Level 300 ng/mL) Final   POC Cocaine UR 12/02/2023 None Detected  NONE DETECTED (Cut Off Level 300 ng/mL) Final   POC Methamphetamine UR 12/02/2023 None Detected  NONE DETECTED (Cut Off Level 1000 ng/mL) Final   POC Morphine 12/02/2023 None Detected  NONE DETECTED (Cut Off Level 300 ng/mL) Final   POC Methadone  UR 12/02/2023 None Detected  NONE DETECTED (Cut Off Level 300 ng/mL) Final   POC Oxycodone UR 12/02/2023 None Detected  NONE DETECTED (Cut Off Level 100 ng/mL) Final   POC Marijuana UR 12/02/2023 None Detected  NONE DETECTED (Cut Off Level 50 ng/mL) Final   Glucose-Capillary 12/06/2023 135 (H)  70  - 99 mg/dL Final   Performed at [facility], [address], [place], [place] [address]  Office Visit on 11/14/2023  Component Date Value Ref Range Status   Hemoglobin A1C 11/14/2023 5.6  4.0 - 5.6 % Final   HbA1c, POC (controlled diabetic ra* 11/14/2023 5.6  0.0 - 7.0 % Final   Microalbumin Ur, POC 11/14/2023 30  mg/L Final   Creatinine, POC 11/14/2023 100  mg/dL Final   Albumin/Creatinine Ratio, Urine, P* 11/14/2023 <30   Final   Hepatitis B Surface Ag 11/14/2023 Negative  Negative Final   Hep B Surface Ab, Qual 11/14/2023 Non Reactive   Final   Comment:              Non Reactive: Not immune to HBV infection.              Equivocal: Unable to determine if anti-HBs                         is present at levels consistent                         with immunity.               Reactive: Anti-HBs concentration detected                         at greater than 10 mIU/mL.                         Individual is considered to be                         immune to infection with HBV.    Hep B Core Total Ab 11/14/2023 Negative  Negative Final   Rfx to HBc IgM 11/14/2023 Comment   Final   Reflex criteria was not met.   Interpretation 11/14/2023 Comment   Final   Comment:                    HBV Serology Interpretation Chart  -------------------------------------------------------------------  Interpretation             HBsAg  anti-HBs  anti-HBc  anti-HBc IgM  -------------------------------------------------------------------  Key - Analyte present: + Analyte absent: - Test not indicated: TNI  -------------------------------------------------------------------  Susceptible (never  infected and no evidence    -        -         -          TNI  of vaccination)  -------------------------------------------------------------------  Immune due to natural  resolved infection          -        +         +          TNI  -------------------------------------------------------------------  Immune  due to vaccination   -        +         -  TNI  -------------------------------------------------------------------  Acute Infection             +        -         +           +  -------------------------------------------------------------------                            Chronic infection           +        -         +           -  -------------------------------------------------------------------  Interpretation unclear*     -        -         +          +/-  -------------------------------------------------------------------  *Multiple possibilities: resolved infection (most common); false-   positive anti-HBc (susceptible); low-level chronic infection;   resolving acute infection.    HCV Ab 11/14/2023 Non Reactive  Non Reactive Final   RPR Ser Ql 11/14/2023 Non Reactive  Non Reactive Final   HIV Screen 4th Generation wRfx 11/14/2023 Non Reactive  Non Reactive Final   Comment: HIV-1/HIV-2 antibodies and HIV-1 p24 antigen were NOT detected. There is no laboratory evidence of HIV infection. HIV Negative    Chlamydia by NAA 11/14/2023 Negative  Negative Final   Gonococcus by NAA 11/14/2023 Negative  Negative Final   Trich vag by NAA 11/14/2023 Negative  Negative Final   WBC 11/14/2023 7.4  3.4 - 10.8 x10E3/uL Final   RBC 11/14/2023 3.83  3.77 - 5.28 x10E6/uL Final   Hemoglobin 11/14/2023 15.0  11.1 - 15.9 g/dL Final   Hematocrit 16/03/9603 42.1  34.0 - 46.6 % Final   MCV 11/14/2023 110 (H)  79 - 97 fL Final   MCH 11/14/2023 39.2 (H)  26.6 - 33.0 pg Final   MCHC 11/14/2023 35.6  31.5 - 35.7 g/dL Final   RDW 54/02/8118 12.6  11.7 - 15.4 % Final   Platelets 11/14/2023 204  150 - 450 x10E3/uL Final   Neutrophils 11/14/2023 48  Not Estab. % Final   Lymphs 11/14/2023 41  Not Estab. % Final   Monocytes 11/14/2023 7  Not Estab. % Final   Eos 11/14/2023 3  Not Estab. % Final   Basos 11/14/2023 1  Not Estab. % Final   Neutrophils Absolute 11/14/2023 3.5  1.4 - 7.0 x10E3/uL  Final   Lymphocytes Absolute 11/14/2023 3.0  0.7 - 3.1 x10E3/uL Final   Monocytes Absolute 11/14/2023 0.5  0.1 - 0.9 x10E3/uL Final   EOS (ABSOLUTE) 11/14/2023 0.2  0.0 - 0.4 x10E3/uL Final   Basophils Absolute 11/14/2023 0.1  0.0 - 0.2 x10E3/uL Final   Immature Granulocytes 11/14/2023 0  Not Estab. % Final   Immature Grans (Abs) 11/14/2023 0.0  0.0 - 0.1 x10E3/uL Final   Glucose 11/14/2023 140 (H)  70 - 99 mg/dL Final   BUN 14/78/2956 10  6 - 20 mg/dL Final   Creatinine, Ser 11/14/2023 0.48 (L)  0.57 - 1.00 mg/dL Final   eGFR 21/30/8657 123  >59 mL/min/1.73 Final   BUN/Creatinine Ratio 11/14/2023 21  9 - 23 Final   Sodium 11/14/2023 137  134 - 144 mmol/L Final   Potassium 11/14/2023 4.6  3.5 - 5.2 mmol/L Final   Chloride 11/14/2023 99  96 - 106 mmol/L Final   CO2 11/14/2023 24  20 - 29 mmol/L Final   Calcium  11/14/2023 9.7  8.7 - 10.2 mg/dL Final   Total Protein 34/74/2595 8.2  6.0 - 8.5 g/dL Final   Albumin 63/87/5643 4.2  3.9 - 4.9 g/dL Final   Globulin, Total 11/14/2023 4.0  1.5 - 4.5 g/dL Final   Bilirubin Total 11/14/2023 1.4 (H)  0.0 - 1.2 mg/dL Final   Alkaline Phosphatase 11/14/2023 88  44 - 121 IU/L Final   AST 11/14/2023 86 (H)  0 - 40 IU/L Final   ALT 11/14/2023 70 (H)  0 - 32 IU/L Final   Cholesterol, Total 11/14/2023 246 (H)  100 - 199 mg/dL Final   Triglycerides 32/95/1884 91  0 - 149 mg/dL Final   HDL 16/60/6301 102  >39 mg/dL Final   VLDL Cholesterol Cal 11/14/2023 15  5 - 40 mg/dL Final   LDL Chol Calc (NIH) 11/14/2023 129 (H)  0 - 99 mg/dL Final   Chol/HDL Ratio 11/14/2023 2.4  0.0 - 4.4 ratio Final   Comment:                                   T. Chol/HDL Ratio                                             Men  Women                               1/2 Avg.Risk  3.4    3.3                                   Avg.Risk  5.0    4.4                                2X Avg.Risk  9.6    7.1                                3X Avg.Risk 23.4   11.0    Ethanol 11/14/2023 <.010   Cutoff=0.010 % Final   TSH 11/14/2023 2.710  0.450 - 4.500 uIU/mL Final   Trichomonas Exam 11/14/2023 Negative  Negative Final   Yeast Exam 11/14/2023 Negative  Negative Final   Clue Cell Exam 11/14/2023 Positive (A)  Negative Final   Color, UA 11/14/2023 yellow  yellow Final   Clarity, UA 11/14/2023 clear  clear Final   Glucose, UA 11/14/2023 negative  negative mg/dL Final   Bilirubin, UA 60/03/9322 negative  negative Final   Ketones, POC UA 11/14/2023 negative  negative mg/dL Final   Spec Grav, UA 55/73/2202 1.015  1.010 - 1.025 Final   Blood, UA 11/14/2023 negative  negative Final   pH, UA 11/14/2023 7.5  5.0 - 8.0 Final   POC PROTEIN,UA 11/14/2023 negative  negative, trace Final   Urobilinogen, UA 11/14/2023 4.0 (A)  0.2 or 1.0 E.U./dL Final   Nitrite, UA 54/27/0623 Negative  Negative Final   Leukocytes, UA 11/14/2023 Negative  Negative Final   Urine Culture, Routine 11/14/2023 Final report   Final  Organism ID, Bacteria 11/14/2023 Comment   Final   Comment: Mixed urogenital flora Less than 10,000 colonies/mL    HCV Interp 1: 11/14/2023 Comment   Final   Comment: Not infected with HCV unless early or acute infection is suspected (which may be delayed in an immunocompromised individual), or other evidence exists to indicate HCV infection.   Admission on 08/19/2023, Discharged on 08/19/2023  Component Date Value Ref Range Status   Sodium 08/19/2023 137  135 - 145 mmol/L Final   Potassium 08/19/2023 3.5  3.5 - 5.1 mmol/L Final   Chloride 08/19/2023 105  98 - 111 mmol/L Final   CO2 08/19/2023 21 (L)  22 - 32 mmol/L Final   Glucose, Bld 08/19/2023 198 (H)  70 - 99 mg/dL Final   Glucose reference range applies only to samples taken after fasting for at least 8 hours.   BUN 08/19/2023 13  6 - 20 mg/dL Final   Creatinine, Ser 08/19/2023 0.44  0.44 - 1.00 mg/dL Final   Calcium  08/19/2023 9.0  8.9 - 10.3 mg/dL Final   GFR, Estimated 08/19/2023 >60  >60 mL/min Final   Comment:  (NOTE) Calculated using the CKD-EPI Creatinine Equation (2021)    Anion gap 08/19/2023 11  5 - 15 Final   Performed at [facility], [address] [place], [place] [address]   WBC 08/19/2023 5.5  4.0 - 10.5 K/uL Final   RBC 08/19/2023 3.57 (L)  3.87 - 5.11 MIL/uL Final   Hemoglobin 08/19/2023 13.8  12.0 - 15.0 g/dL Final   HCT 62/95/2841 39.7  36.0 - 46.0 % Final   MCV 08/19/2023 111.2 (H)  80.0 - 100.0 fL Final   MCH 08/19/2023 38.7 (H)  26.0 - 34.0 pg Final   MCHC 08/19/2023 34.8  30.0 - 36.0 g/dL Final   RDW 32/44/0102 12.7  11.5 - 15.5 % Final   Platelets 08/19/2023 183  150 - 400 K/uL Final   nRBC 08/19/2023 0.0  0.0 - 0.2 % Final   Performed at [facility], [address], [place], [place] [address]   Preg, Serum 08/19/2023 NEGATIVE  NEGATIVE Final   Comment:        THE SENSITIVITY OF THIS METHODOLOGY IS >10 mIU/mL. Performed at [facility], [address], [place], [place] [address]   Admission on 07/29/2023, Discharged on 07/30/2023  Component Date Value Ref Range Status   WBC 07/29/2023 5.2  4.0 - 10.5 K/uL Final   RBC 07/29/2023 3.59 (L)  3.87 - 5.11 MIL/uL Final   Hemoglobin 07/29/2023 14.1  12.0 - 15.0 g/dL Final   HCT 47/42/5956 40.5  36.0 - 46.0 % Final   MCV 07/29/2023 112.8 (H)  80.0 - 100.0 fL Final   MCH 07/29/2023 39.3 (H)  26.0 - 34.0 pg Final   MCHC 07/29/2023 34.8  30.0 - 36.0 g/dL Final   RDW 38/75/6433 13.1  11.5 - 15.5 % Final   Platelets 07/29/2023 171  150 - 400 K/uL Final   nRBC 07/29/2023 0.0  0.0 - 0.2 % Final   Performed at [facility], [address] [place], [place] [address]   Preg, Serum 07/29/2023 NEGATIVE  NEGATIVE Final   Comment:        THE SENSITIVITY OF THIS METHODOLOGY IS >10 mIU/mL. Performed at [facility], [address], [place], [place] [address]    SARS Coronavirus 2 by RT PCR 07/29/2023 NEGATIVE  NEGATIVE Final   Comment:  (  NOTE) SARS-CoV-2 target nucleic acids are NOT DETECTED.  The SARS-CoV-2 RNA is generally detectable in upper respiratory specimens during the acute phase of infection. The lowest concentration of SARS-CoV-2 viral copies this assay can detect is 138 copies/mL. A negative result does not preclude SARS-Cov-2 infection and should not be used as the sole basis for treatment or other patient management decisions. A negative result may occur with  improper specimen collection/handling, submission of specimen other than nasopharyngeal swab, presence of viral mutation(s) within the areas targeted by this assay, and inadequate number of viral copies(<138 copies/mL). A negative result must be combined with clinical observations, patient history, and epidemiological information. The expected result is Negative.  Fact Sheet for Patients:  BloggerCourse.com  Fact Sheet for Healthcare Providers:  SeriousBroker.it  This test is no                          t yet approved or cleared by the [place]  FDA and  has been authorized for detection and/or diagnosis of SARS-CoV-2 by FDA under an Emergency Use Authorization (EUA). This EUA will remain  in effect (meaning this test can be used) for the duration of the COVID-19 declaration under Section 564(b)(1) of the Act, 21 U.S.C.section 360bbb-3(b)(1), unless the authorization is terminated  or revoked sooner.       Influenza A by PCR 07/29/2023 NEGATIVE  NEGATIVE Final   Influenza B by PCR 07/29/2023 NEGATIVE  NEGATIVE Final   Comment: (NOTE) The Xpert Xpress SARS-CoV-2/FLU/RSV plus assay is intended as an aid in the diagnosis of influenza from Nasopharyngeal swab specimens and should not be used as a sole basis for treatment. Nasal washings and aspirates are unacceptable for Xpert Xpress SARS-CoV-2/FLU/RSV testing.  Fact Sheet for Patients: BloggerCourse.com  Fact  Sheet for Healthcare Providers: SeriousBroker.it  This test is not yet approved or cleared by the [place]  FDA and has been authorized for detection and/or diagnosis of SARS-CoV-2 by FDA under an Emergency Use Authorization (EUA). This EUA will remain in effect (meaning this test can be used) for the duration of the COVID-19 declaration under Section 564(b)(1) of the Act, 21 U.S.C. section 360bbb-3(b)(1), unless the authorization is terminated or revoked.     Resp Syncytial Virus by PCR 07/29/2023 NEGATIVE  NEGATIVE Final   Comment: (NOTE) Fact Sheet for Patients: BloggerCourse.com  Fact Sheet for Healthcare Providers: SeriousBroker.it  This test is not yet approved or cleared by the [place]  FDA and has been authorized for detection and/or diagnosis of SARS-CoV-2 by FDA under an Emergency Use Authorization (EUA). This EUA will remain in effect (meaning this test can be used) for the duration of the COVID-19 declaration under Section 564(b)(1) of the Act, 21 U.S.C. section 360bbb-3(b)(1), unless the authorization is terminated or revoked.  Performed at [facility], [address], [place], [place] [address]    Sodium 07/30/2023 136  135 - 145 mmol/L Final   Potassium 07/30/2023 3.4 (L)  3.5 - 5.1 mmol/L Final   Chloride 07/30/2023 100  98 - 111 mmol/L Final   CO2 07/30/2023 25  22 - 32 mmol/L Final   Glucose, Bld 07/30/2023 122 (H)  70 - 99 mg/dL Final   Glucose reference range applies only to samples taken after fasting for at least 8 hours.   BUN 07/30/2023 16  6 - 20 mg/dL Final   Creatinine, Ser 07/30/2023 0.58  0.44 - 1.00 mg/dL Final   Calcium  07/30/2023 9.5  8.9 - 10.3 mg/dL Final   GFR, Estimated 07/30/2023 >60  >60 mL/min Final   Comment: (NOTE) Calculated using the CKD-EPI Creatinine Equation (2021)    Anion gap 07/30/2023 11  5 - 15 Final   Performed at  [facility], [address] [place], [place] [address]   Troponin I (High Sensitivity) 07/30/2023 <2  <18 ng/L Final   Comment: (NOTE) Elevated high sensitivity troponin I (hsTnI) values and significant  changes across serial measurements may suggest ACS but many other  chronic and acute conditions are known to elevate hsTnI results.  Refer to the Links section for chest pain algorithms and additional  guidance. Performed at [facility], [address] [place], [place] [address]    Glucose-Capillary 07/30/2023 141 (H)  70 - 99 mg/dL Final   Glucose reference range applies only to samples taken after fasting for at least 8 hours.    Blood Alcohol level:  Lab Results  Component Value Date   ETH 21 (H) 12/02/2023    Metabolic Disorder Labs: Lab Results  Component Value Date   HGBA1C 5.6 11/14/2023   HGBA1C 5.6 11/14/2023   MPG 111 09/18/2019   No results found for: PROLACTIN Lab Results  Component Value Date   CHOL 246 (H) 11/14/2023   TRIG 91 11/14/2023   HDL 102 11/14/2023   CHOLHDL 2.4 11/14/2023   LDLCALC 129 (H) 11/14/2023   LDLCALC 104 (H) 03/27/2023    Therapeutic Lab Levels: No results found for: LITHIUM No results found for: VALPROATE No results found for: CBMZ  Physical Findings   AUDIT    Flowsheet Row ED from 12/02/2023 in [facility]  Alcohol Use Disorder Identification Test Final Score (AUDIT) 31   GAD-7    Flowsheet Row Office Visit from 11/14/2023 in [facility] Primary Care & Sports Medicine at [facility] Office Visit from 06/06/2022 in [facility] Primary Care & Sports Medicine at [facility] Office Visit from 11/03/2019 in [facility] Primary Care & Sports Medicine at [facility] Office Visit from 09/28/2019 in [facility] Primary Care & Sports Medicine at [facility] Office Visit from 01/26/2019 in [facility] Primary Care & Sports  Medicine at [facility]  Total GAD-7 Score 10 9 0 0 5   PHQ2-9    Flowsheet Row ED from 12/02/2023 in [facility] Office Visit from 11/14/2023 in [facility] Primary Care & Sports Medicine at [facility] Office Visit from 08/07/2022 in [facility] Primary Care & Sports Medicine at [facility] Office Visit from 06/06/2022 in [facility] Primary Care & Sports Medicine at [facility] Office Visit from 02/01/2022 in [facility] Primary Care & Sports Medicine at [facility]  PHQ-2 Total Score 2 5 0 4 0  PHQ-9 Total Score 5 15 -- 16 --   Flowsheet Row ED from 12/02/2023 in [facility] Most recent reading at 12/02/2023  9:44 PM ED from 12/02/2023 in [facility] Most recent reading at 12/02/2023  6:00 PM ED from 08/19/2023 in [facility] Emergency Department at [facility] Most recent reading at 08/19/2023  8:39 AM  C-SSRS RISK CATEGORY No Risk No Risk No Risk     Musculoskeletal  Strength & Muscle Tone: within normal limits Gait & Station: normal Patient leans: N/A  Psychiatric Specialty Exam  Presentation  General Appearance:  Casual  Eye Contact: Good  Speech: Clear and Coherent  Speech Volume:  Normal  Handedness: Right   Mood and Affect  Mood: About the same  Affect: Anxious, Congruent   Thought Process  Thought Processes: Coherent  Descriptions of Associations:Intact  Orientation:Full (Time, Place and Person)  Thought Content:Logical  Diagnosis of Schizophrenia or Schizoaffective disorder in past: No    Hallucinations: None  Ideas of Reference:None  Suicidal Thoughts: None  Homicidal Thoughts: None   Sensorium  Memory: Immediate Good  Judgment: Fair  Insight: Fair   Art therapist  Concentration: Fair  Attention Span: Fair  Recall: Good  Fund of  Knowledge: Good  Language: Good   Psychomotor Activity  Psychomotor Activity: Normal    Assets  Assets: Desire for Improvement; Resilience   Sleep  Sleep: Fair    Physical Exam  Physical Exam ROS Physical Exam Constitutional:      Appearance: the patient is not toxic-appearing.  Pulmonary:     Effort: Pulmonary effort is normal.  Neurological:     General: No focal deficit present.     Mental Status: the patient is alert and oriented to person, place, and time.   Review of Systems  Respiratory:  Negative for shortness of breath.   Cardiovascular:  Negative for chest pain.  Gastrointestinal:  Negative for abdominal pain, constipation, diarrhea, nausea and vomiting.  Neurological:  Negative for headaches.   Blood pressure 120/82, pulse 79, temperature 97.8 F (36.6 C), temperature source Oral, resp. rate 16, SpO2 99%. There is no height or weight on file to calculate BMI.  Treatment Plan Summary: Daily contact with patient to assess and evaluate symptoms and progress in treatment, Medication management, and Plan    Patient is a 38 year old female with a past psychiatric history of MDD, GAD, alcohol use disorder, no past psychiatric hospitalizations or suicide attempts who presented to the behavioral health urgent care with her daughter for alcohol detox and is interested in residential treatment.   Patient not experiencing much withdrawal symptoms at this time. BP stable. Switched patient to symptom-triggered CIWA protocol given she continues to decline ativan  taper despite education. She has severe alcohol use disorder that is impacting occupational and social functioning. Started acamprosate  without side effects. She also has history of MDD and GAD which was treated with zoloft . Currently substance-induced mood disorder given her ongoing daily alcohol use, will increase zoloft  given prior efficacy and as she is coming off of alcohol  Status: VOL   Labs:  Alcohol  level 21 on admission.  UDS negative.  TSH within normal limits.  MCV 107.5. CMP AST 92, ALT 66, Tbili 1.8, Cr 0.51. EKG Qtc 479.   #Alcohol use disorder -continue CIWA, last drink 6/9  -continue sx-triggered ativan   -continue PRN ativan  for CIWA >10  -PRN: atarax , tylenol , maalox, imodium , milk of mg, zofran  -daily MV, thiamine   -continue acamprosate  666 TID for alcohol use disorder  #Substance-induced mood disorder #History of MDD, GAD  --increase zoloft  50mg  for depression and anxiety  --continue hydroxyzine  50mg  PRN at bedtime for insomnia  --continue trazodone  50mg  for insomnia   #DM  -continue home metformin   -continue AM fasting glucose   Dispo: [facility] Monday at [facility], MD 12/07/2023 10:39 AM  This case was discussed with attending Dr. Genita Keys who agrees with the above formulated treatment plan. Please see attending attestation for additional details.   This note was created using a voice recognition software as a result there may be grammatical errors inadvertently enclosed that do not reflect the nature of this encounter. Every attempt  is made to correct such errors.

## 2023-12-07 NOTE — Group Note (Signed)
 Group Topic: Relaxation  Group Date: 12/07/2023 Start Time: 2030 End Time: 2045 Facilitators: Marcha Session  Department: [facility]  Number of Participants: 8  Group Focus: art therapy, check in, and clarity of thought Treatment Modality:  Art Therapy and Cognitive Behavioral Therapy Interventions utilized were group exercise and leisure development Purpose: increase insight and foster various positive outcomes, including enhanced communication, increased morale, improved teamwork, and development of problem solving essential skills.  Name: [facility] Dawanna Grauberger Date of Birth: Apr 11, 1986  MR: [id number]    Level of Participation: active Quality of Participation: attentive and cooperative Interactions with others: gave feedback Mood/Affect: appropriate Triggers (if applicable): N/A Cognition: concrete Progress: Gaining insight Response: N/A Plan: patient will be encouraged to keep attending group and interacting with peers during group time.  Patients Problems:  Patient Active Problem List   Diagnosis Date Noted   Alcohol abuse 12/02/2023   Benign paroxysmal positional vertigo 03/27/2023   Screening examination for STD (sexually transmitted disease) 03/27/2023   Controlled type 2 diabetes mellitus without complication, without long-term current use of insulin  (HCC) 01/31/2022   GAD (generalized anxiety disorder) 09/28/2019   Seasonal allergies 09/28/2019   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

## 2023-12-07 NOTE — ED Notes (Signed)
 Patient is around the nurses station talking on the phone. Pleasant looking today. Denies SI/HI & AVH. NAD Respirations are even and unlabored. Safety checks in place per policy. Will monitor for safety.

## 2023-12-07 NOTE — ED Notes (Signed)
 Patient is sleeping. Respirations equal and unlabored. No change in assessment or acuity. Routine safety checks conducted according to facility protocol.

## 2023-12-07 NOTE — Group Note (Signed)
 Group Topic: Social Support  Group Date: 12/07/2023 Start Time: 1400 End Time: 1500 Facilitators: Arlan Belling, RN  Department: [facility]  Number of Participants: 9  Group Focus: social skills Treatment Modality:  Leisure Development Interventions utilized were clarification, exploration, and group exercise Purpose: regain self-worth and reinforce self-care  Name: Sara Simpson Date of Birth: 04/14/1986  MR: [id number]    Level of Participation: did not participate Quality of Participation:  Interactions with others:  Mood/Affect:  Triggers (if applicable):  Cognition:  Progress:  Response:  Plan:   Patients Problems:  Patient Active Problem List   Diagnosis Date Noted   Alcohol abuse 12/02/2023   Benign paroxysmal positional vertigo 03/27/2023   Screening examination for STD (sexually transmitted disease) 03/27/2023   Controlled type 2 diabetes mellitus without complication, without long-term current use of insulin  (HCC) 01/31/2022   GAD (generalized anxiety disorder) 09/28/2019   Seasonal allergies 09/28/2019   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

## 2023-12-08 DIAGNOSIS — F339 Major depressive disorder, recurrent, unspecified: Secondary | ICD-10-CM | POA: Diagnosis not present

## 2023-12-08 DIAGNOSIS — F419 Anxiety disorder, unspecified: Secondary | ICD-10-CM | POA: Diagnosis not present

## 2023-12-08 DIAGNOSIS — F1994 Other psychoactive substance use, unspecified with psychoactive substance-induced mood disorder: Secondary | ICD-10-CM | POA: Diagnosis not present

## 2023-12-08 DIAGNOSIS — F1020 Alcohol dependence, uncomplicated: Secondary | ICD-10-CM | POA: Diagnosis not present

## 2023-12-08 LAB — GLUCOSE, CAPILLARY: Glucose-Capillary: 107 mg/dL — ABNORMAL HIGH (ref 70–99)

## 2023-12-08 MED ORDER — TRAZODONE HCL 50 MG PO TABS
50.0000 mg | ORAL_TABLET | Freq: Every evening | ORAL | 0 refills | Status: AC | PRN
Start: 2023-12-08 — End: ?

## 2023-12-08 MED ORDER — ACAMPROSATE CALCIUM 333 MG PO TBEC: 666.0000 mg | DELAYED_RELEASE_TABLET | Freq: Three times a day (TID) | ORAL | 0 refills | Status: AC

## 2023-12-08 MED ORDER — ROSUVASTATIN CALCIUM 5 MG PO TABS
5.0000 mg | ORAL_TABLET | Freq: Every day | ORAL | Status: DC
  Filled 2023-12-08: qty 7

## 2023-12-08 MED ORDER — SERTRALINE HCL 50 MG PO TABS: 50.0000 mg | ORAL_TABLET | Freq: Every day | ORAL | 0 refills | Status: AC

## 2023-12-08 MED ORDER — HYDROXYZINE HCL 50 MG PO TABS: 50.0000 mg | ORAL_TABLET | Freq: Every evening | ORAL | 0 refills | Status: AC | PRN

## 2023-12-08 MED ORDER — METFORMIN HCL ER 500 MG PO TB24: 500.0000 mg | ORAL_TABLET | Freq: Every day | ORAL | 0 refills | Status: DC

## 2023-12-08 NOTE — ED Notes (Signed)
 Patient is in the bedroom calm and sleeping with eyes closed. NAD. Environment secured. Will continue to monitor for safety.

## 2023-12-08 NOTE — ED Notes (Signed)
 Patient alert & oriented x4. Denies intent to harm self or others when asked. Denies A/VH. Patient denies any physical complaints when asked. No acute distress noted. Scheduled medications administered with no complications. Support and encouragement provided. Routine safety checks conducted per facility protocol. Encouraged patient to notify staff if any thoughts of harm towards self or others arise. Patient verbalizes understanding and agreement.

## 2023-12-08 NOTE — ED Provider Notes (Addendum)
 FBC/OBS ASAP Discharge Summary  Date and Time: 12/08/2023 4:39 PM  Name: Sara Simpson Sara Simpson  MRN:  [id number]   Discharge Diagnoses:  Final diagnoses:  Recurrent major depressive disorder, remission status unspecified (HCC)  Alcohol use disorder, severe, dependence (HCC)  Anxiety disorder, unspecified type  Substance induced mood disorder (HCC)   Sara Simpson Sara Simpson is a 38 year old female with a past psychiatric history of MDD, GAD, alcohol use disorder, no past psychiatric hospitalizations or suicide attempts who presented to the behavioral health urgent care with her daughter for alcohol detox and is interested in residential treatment.   Subjective:   Patient was seen on the unit, no acute distress.  Feels ready to go to [facility] residential.  Mood:  Sleep:  Appetite:  Denied active and passive SI, HI, AVH, paranoia  Aware of [facility], 988, 911  ROS   Stay Summary:  Admission date: 12/02/2023 Discharge date: 12/08/2023    Patient was monitored for alcohol withdrawal symptoms.  Patient's last drink was 12/02/2023.  Patient was continued on home metformin  for diabetes, started on sertraline  for depression and anxiety, and started on acamprosate  which was titrated up to 666 mg 3 times daily for alcohol use.  Withdrawal symptoms were minimal throughout the hospitalization peaking at CIWA score of 6.  As needed lorazepam  was not required.  Total Time spent with patient: 1 hour  Past Psychiatric History: see H&P Past Medical History: see H&P Family History: see H&P Family Psychiatric History: see H&P Social History: see H&P Tobacco Cessation:  N/A, patient does not currently use tobacco products  Current Medications:  Current Facility-Administered Medications  Medication Dose Route Frequency Provider Last Rate Last Admin   acamprosate  (CAMPRAL ) tablet 666 mg  666 mg Oral TID Chien, Stephanie, MD   666 mg at 12/08/23 1020   alum & mag hydroxide-simeth  (MAALOX/MYLANTA) 200-200-20 MG/5ML suspension 30 mL  30 mL Oral Q4H PRN Dorthea Gauze, NP       folic acid  (FOLVITE ) tablet 1 mg  1 mg Oral Daily Chien, Stephanie, MD   1 mg at 12/08/23 1020   hydrOXYzine  (ATARAX ) tablet 50 mg  50 mg Oral QHS PRN Chien, Stephanie, MD   50 mg at 12/07/23 2112   magnesium  hydroxide (MILK OF MAGNESIA) suspension 30 mL  30 mL Oral Daily PRN Dorthea Gauze, NP       metFORMIN  (GLUCOPHAGE -XR) 24 hr tablet 500 mg  500 mg Oral Q breakfast Dorthea Gauze, NP   500 mg at 12/08/23 0846   multivitamin with minerals tablet 1 tablet  1 tablet Oral Daily Dorthea Gauze, NP   1 tablet at 12/08/23 1020   OLANZapine  (ZYPREXA ) injection 10 mg  10 mg Intramuscular TID PRN Dorthea Gauze, NP       OLANZapine  (ZYPREXA ) injection 5 mg  5 mg Intramuscular TID PRN Dorthea Gauze, NP       OLANZapine  zydis (ZYPREXA ) disintegrating tablet 5 mg  5 mg Oral TID PRN Dorthea Gauze, NP       sertraline  (ZOLOFT ) tablet 50 mg  50 mg Oral Daily Chien, Stephanie, MD   50 mg at 12/08/23 1020   thiamine  (VITAMIN B1) tablet 100 mg  100 mg Oral Daily Dorthea Gauze, NP   100 mg at 12/08/23 1020   traZODone  (DESYREL ) tablet 50 mg  50 mg Oral QHS PRN Chien, Stephanie, MD       Current Outpatient Medications  Medication Sig Dispense Refill   blood glucose meter kit and supplies  KIT Dispense based on patient and insurance preference. Use up to four times daily as directed. Please include lancets, test strips, control solution. 1 each 0   Continuous Blood Gluc Sensor (FREESTYLE LIBRE 3 SENSOR) MISC 1 each by Does not apply route every 14 (fourteen) days. 6 each 1   Continuous Glucose Monitor Sup KIT 1 each by Does not apply route continuous. 1 kit 0   rosuvastatin  (CRESTOR ) 5 MG tablet Take 1 tablet (5 mg total) by mouth daily. 90 tablet 3   acamprosate  (CAMPRAL ) 333 MG tablet Take 2 tablets (666 mg total) by mouth 3 (three) times daily. 180 tablet 0   hydrOXYzine  (ATARAX ) 50 MG tablet Take 1 tablet (50 mg total)  by mouth at bedtime as needed (insomnia). 30 tablet 0   metFORMIN  (GLUCOPHAGE -XR) 500 MG 24 hr tablet Take 1 tablet (500 mg total) by mouth daily with breakfast. 30 tablet 0   sertraline  (ZOLOFT ) 50 MG tablet Take 1 tablet (50 mg total) by mouth daily. 30 tablet 0   traZODone  (DESYREL ) 50 MG tablet Take 1 tablet (50 mg total) by mouth at bedtime as needed for sleep. 30 tablet 0   PTA Medications:  Facility Ordered Medications  Medication   alum & mag hydroxide-simeth (MAALOX/MYLANTA) 200-200-20 MG/5ML suspension 30 mL   magnesium  hydroxide (MILK OF MAGNESIA) suspension 30 mL   [COMPLETED] thiamine  (VITAMIN B1) injection 100 mg   thiamine  (VITAMIN B1) tablet 100 mg   multivitamin with minerals tablet 1 tablet   [EXPIRED] hydrOXYzine  (ATARAX ) tablet 25 mg   [EXPIRED] loperamide  (IMODIUM ) capsule 2-4 mg   [EXPIRED] ondansetron  (ZOFRAN -ODT) disintegrating tablet 4 mg   OLANZapine  zydis (ZYPREXA ) disintegrating tablet 5 mg   OLANZapine  (ZYPREXA ) injection 5 mg   OLANZapine  (ZYPREXA ) injection 10 mg   [EXPIRED] LORazepam  (ATIVAN ) tablet 1 mg   metFORMIN  (GLUCOPHAGE -XR) 24 hr tablet 500 mg   [COMPLETED] ibuprofen  (ADVIL ) tablet 400 mg   [EXPIRED] LORazepam  (ATIVAN ) tablet 1-4 mg   Or   [EXPIRED] LORazepam  (ATIVAN ) tablet 1 mg   folic acid  (FOLVITE ) tablet 1 mg   hydrOXYzine  (ATARAX ) tablet 50 mg   acamprosate  (CAMPRAL ) tablet 666 mg   [COMPLETED] ibuprofen  (ADVIL ) tablet 400 mg   [COMPLETED] ibuprofen  (ADVIL ) tablet 200 mg   sertraline  (ZOLOFT ) tablet 50 mg   traZODone  (DESYREL ) tablet 50 mg   [COMPLETED] ibuprofen  (ADVIL ) tablet 600 mg   [COMPLETED] ibuprofen  (ADVIL ) tablet 600 mg   PTA Medications  Medication Sig   blood glucose meter kit and supplies KIT Dispense based on patient and insurance preference. Use up to four times daily as directed. Please include lancets, test strips, control solution.   Continuous Blood Gluc Sensor (FREESTYLE LIBRE 3 SENSOR) MISC 1 each by Does not  apply route every 14 (fourteen) days.   Continuous Glucose Monitor Sup KIT 1 each by Does not apply route continuous.   rosuvastatin  (CRESTOR ) 5 MG tablet Take 1 tablet (5 mg total) by mouth daily.   acamprosate  (CAMPRAL ) 333 MG tablet Take 2 tablets (666 mg total) by mouth 3 (three) times daily.   hydrOXYzine  (ATARAX ) 50 MG tablet Take 1 tablet (50 mg total) by mouth at bedtime as needed (insomnia).   sertraline  (ZOLOFT ) 50 MG tablet Take 1 tablet (50 mg total) by mouth daily.   traZODone  (DESYREL ) 50 MG tablet Take 1 tablet (50 mg total) by mouth at bedtime as needed for sleep.   metFORMIN  (GLUCOPHAGE -XR) 500 MG 24 hr tablet Take 1 tablet (500 mg total)  by mouth daily with breakfast.      12/05/2023    9:35 AM 12/02/2023    8:18 PM 11/14/2023    4:16 PM  Depression screen PHQ 2/9  Decreased Interest 1 2 3   Down, Depressed, Hopeless 1 2 2   PHQ - 2 Score 2 4 5   Altered sleeping 1 2 3   Tired, decreased energy 1 2 3   Change in appetite 0 2 1  Feeling bad or failure about yourself  1 1 2   Trouble concentrating 0 0 0  Moving slowly or fidgety/restless 0 0 1  Suicidal thoughts 0 0 0  PHQ-9 Score 5 11 15   Difficult doing work/chores   Somewhat difficult   Flowsheet Row ED from 12/02/2023 in [facility] Most recent reading at 12/02/2023  9:44 PM ED from 12/02/2023 in [facility] Most recent reading at 12/02/2023  6:00 PM ED from 08/19/2023 in [facility] Emergency Department at [facility] Most recent reading at 08/19/2023  8:39 AM  C-SSRS RISK CATEGORY No Risk No Risk No Risk   Musculoskeletal  Strength & Muscle Tone: within normal limits Gait & Station: normal Patient leans: N/A  Strength & Muscle Tone: within normal limits Gait & Station: normal Patient leans: N/A  Psychiatric Specialty Exam  Presentation General Appearance:Appropriate for Environment, Casual Eye Contact:Good Speech:Clear and Coherent, Normal  Rate Volume:Normal  Mood and Affect  Mood:Euthymic Affect:Appropriate, Congruent  Thought Process  Thought Process:Coherent, Goal Directed, Linear Descriptions of Associations:Intact  Thought Content Suicidal Thoughts:No Homicidal Thoughts:No Hallucinations:None Ideas of Reference:None Thought Content:Logical  Sensorium  Memory:Immediate Good, Recent Good, Remote Good Judgment:Fair Insight:Fair  Executive Functions  Orientation:Full (Time, Place and Person) Language:Good Concentration:Good Attention:Good Recall:Good Fund of Knowledge:Good  Psychomotor Activity  Psychomotor Activity:Psychomotor Activity: Normal  Assets  Assets:Communication Skills, Desire for Improvement, Physical Health  Sleep  Quality:Good Duration:   hours   Physical Exam  Physical Exam Blood pressure 124/86, pulse 84, temperature 98.2 F (36.8 C), temperature source Oral, resp. rate 17, SpO2 98%. There is no height or weight on file to calculate BMI.  Demographic Factors:  NA  Loss Factors: NA  Historical Factors: Impulsivity  Risk Reduction Factors:   Positive social support, Positive therapeutic relationship, and Positive coping skills or problem solving skills  Continued Clinical Symptoms:  Alcohol/Substance Abuse/Dependencies More than one psychiatric diagnosis  Cognitive Features That Contribute To Risk:  None    Suicide Risk:  Mild:  Suicidal ideation of limited frequency, intensity, duration, and specificity.  There are no identifiable plans, no associated intent, mild dysphoria and related symptoms, good self-control (both objective and subjective assessment), few other risk factors, and identifiable protective factors, including available and accessible social support.   Plan Of Care/Follow-up recommendations:   Follow-up recommendations:   Activity:  as tolerated Diet:  heart healthy   Comments:  Prescriptions were given at discharge.  Patient is agreeable with  the discharge plan.  Patient was given an opportunity to ask questions.  Patient appears to feel comfortable with discharge and denies any current suicidal or homicidal thoughts.    Patient is instructed prior to discharge to: Take all medications as prescribed by mental healthcare provider. Report any adverse effects and or reactions from the medicines to outpatient provider promptly. In the event of worsening symptoms, patient is instructed to call the crisis hotline, 911 and or go to the nearest ED for appropriate evaluation and treatment of symptoms. Patient is to follow-up with primary care  provider for other medical issues, concerns and or health care needs.    Disposition:  [facility]  Augusta Blizzard, MD

## 2023-12-08 NOTE — Group Note (Signed)
 Group Topic: Emotional Regulation  Group Date: 12/08/2023 Start Time: 8413 End Time: 2000 Facilitators: Alvino Joseph, NT  Department: [facility]  Number of Participants: 9  Group Focus: goals/reality orientation Treatment Modality:  Individual Therapy Interventions utilized were assignment Purpose: explore maladaptive thinking  Name: Sara Simpson Date of Birth: 1986/05/27  MR: [id number]    Level of Participation: active Quality of Participation: cooperative Interactions with others: gave feedback Mood/Affect: appropriate Triggers (if applicable): n/a Cognition: coherent/clear Progress: Moderate Response: daily goal- get my day ready, shower, read, and get ready for Monday, where I will be going to a new facility, and do what I set out to do Plan: follow-up needed  Patients Problems:  Patient Active Problem List   Diagnosis Date Noted   Alcohol abuse 12/02/2023   Benign paroxysmal positional vertigo 03/27/2023   Screening examination for STD (sexually transmitted disease) 03/27/2023   Controlled type 2 diabetes mellitus without complication, without long-term current use of insulin  (HCC) 01/31/2022   GAD (generalized anxiety disorder) 09/28/2019   Seasonal allergies 09/28/2019   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

## 2023-12-08 NOTE — ED Notes (Signed)
 Patient calm and sleeping comfortably. Will continue to monitor for safety.

## 2023-12-08 NOTE — ED Notes (Signed)
 Patient is in the bedroom calm and composed. NAD. Denies needing anything at the moment. Denies SI/HI/AVH. Respirations even and unlabored. Environment secured per policy. Will monitor for safety.

## 2023-12-08 NOTE — ED Notes (Signed)
 Patient resting with eyes closed in no apparent acute distress. Respirations even and unlabored. Environment secured. Safety checks in place according to facility policy.

## 2023-12-09 DIAGNOSIS — F1994 Other psychoactive substance use, unspecified with psychoactive substance-induced mood disorder: Secondary | ICD-10-CM | POA: Diagnosis not present

## 2023-12-09 DIAGNOSIS — F1010 Alcohol abuse, uncomplicated: Secondary | ICD-10-CM | POA: Diagnosis not present

## 2023-12-09 DIAGNOSIS — F419 Anxiety disorder, unspecified: Secondary | ICD-10-CM | POA: Diagnosis not present

## 2023-12-09 DIAGNOSIS — F1020 Alcohol dependence, uncomplicated: Secondary | ICD-10-CM | POA: Diagnosis not present

## 2023-12-09 DIAGNOSIS — F339 Major depressive disorder, recurrent, unspecified: Secondary | ICD-10-CM | POA: Diagnosis not present

## 2023-12-09 LAB — GLUCOSE, CAPILLARY: Glucose-Capillary: 140 mg/dL — ABNORMAL HIGH (ref 70–99)

## 2023-12-09 NOTE — ED Notes (Signed)
 Patient asleep. NAD, will continue to monitor.

## 2023-12-09 NOTE — Addendum Note (Signed)
 Addended by: Abundio Hoit on: 12/09/2023 12:57 PM   Modules accepted: Orders

## 2023-12-09 NOTE — ED Notes (Signed)
 Patient A&Ox4. Denies intent to harm self/others when asked. Denies A/VH. Patient denies any physical complaints when asked. No acute distress noted. Support and encouragement provided. Routine safety checks conducted according to facility protocol. Encouraged patient to notify staff if thoughts of harm toward self or others arise. Patient verbalize understanding and agreement. Will continue to monitor for safety.

## 2023-12-09 NOTE — ED Notes (Signed)
 Patient A&O x 4, ambulatory. Patient discharged in no acute distress. Patient denied SI/HI, A/VH upon discharge. Patient verbalized understanding of all discharge instructions reviewed on AVS via staff, to include follow up appointments, RX's and safety. Suicide safety plan completed and reviewed with Clinical research associate. A copy given to pt. Patient reported mood 10/10.  Pt belongings returned to patient from locker intact. Patient escorted to lobby via staff for transport to [facility]. Safety maintained.

## 2023-12-18 NOTE — Telephone Encounter (Signed)
 Again attempted call to patient. Voice mail box not set up . Could not leave a voice mail message.

## 2024-01-20 ENCOUNTER — Other Ambulatory Visit: Payer: Self-pay | Admitting: Medical-Surgical

## 2024-01-20 DIAGNOSIS — E119 Type 2 diabetes mellitus without complications: Secondary | ICD-10-CM

## 2024-01-20 MED ORDER — METFORMIN HCL ER 500 MG PO TB24: 500.0000 mg | ORAL_TABLET | Freq: Every day | ORAL | 3 refills | Status: AC

## 2024-03-21 ENCOUNTER — Other Ambulatory Visit: Payer: Self-pay | Admitting: Medical Genetics

## 2024-05-15 ENCOUNTER — Ambulatory Visit: Payer: Self-pay | Admitting: Medical-Surgical

## 2024-05-15 DIAGNOSIS — E119 Type 2 diabetes mellitus without complications: Secondary | ICD-10-CM
